# Patient Record
Sex: Female | Born: 1935 | Race: White | Hispanic: No | Marital: Married | Smoking: Former smoker
Health system: Southern US, Community
[De-identification: ages and names within clinical notes are randomized; demographics above are authoritative.]

## PROBLEM LIST (undated history)

## (undated) DIAGNOSIS — C801 Malignant (primary) neoplasm, unspecified: Secondary | ICD-10-CM

## (undated) DIAGNOSIS — R413 Other amnesia: Secondary | ICD-10-CM

## (undated) DIAGNOSIS — K635 Polyp of colon: Secondary | ICD-10-CM

## (undated) DIAGNOSIS — M069 Rheumatoid arthritis, unspecified: Secondary | ICD-10-CM

## (undated) DIAGNOSIS — G629 Polyneuropathy, unspecified: Secondary | ICD-10-CM

## (undated) DIAGNOSIS — M8580 Other specified disorders of bone density and structure, unspecified site: Secondary | ICD-10-CM

## (undated) DIAGNOSIS — I899 Noninfective disorder of lymphatic vessels and lymph nodes, unspecified: Secondary | ICD-10-CM

## (undated) DIAGNOSIS — Z973 Presence of spectacles and contact lenses: Secondary | ICD-10-CM

## (undated) DIAGNOSIS — E049 Nontoxic goiter, unspecified: Secondary | ICD-10-CM

## (undated) DIAGNOSIS — E785 Hyperlipidemia, unspecified: Secondary | ICD-10-CM

## (undated) HISTORY — PX: LUNG SURGERY: SHX703

## (undated) HISTORY — DX: Malignant (primary) neoplasm, unspecified: C80.1

## (undated) HISTORY — PX: CATARACT EXTRACTION: SUR2

## (undated) HISTORY — PX: FOOT SURGERY: SHX648

## (undated) HISTORY — PX: WRIST SURGERY: SHX841

## (undated) HISTORY — DX: Noninfective disorder of lymphatic vessels and lymph nodes, unspecified: I89.9

## (undated) HISTORY — DX: Polyp of colon: K63.5

## (undated) HISTORY — DX: Hyperlipidemia, unspecified: E78.5

## (undated) HISTORY — DX: Other specified disorders of bone density and structure, unspecified site: M85.80

## (undated) HISTORY — DX: Other amnesia: R41.3

## (undated) HISTORY — DX: Polyneuropathy, unspecified: G62.9

## (undated) HISTORY — DX: Nontoxic goiter, unspecified: E04.9

---

## 1998-01-16 ENCOUNTER — Ambulatory Visit (HOSPITAL_COMMUNITY): Admission: RE | Admit: 1998-01-16 | Discharge: 1998-01-16 | Payer: Self-pay | Admitting: Endocrinology

## 1999-03-20 ENCOUNTER — Encounter: Admission: RE | Admit: 1999-03-20 | Discharge: 1999-03-20 | Payer: Self-pay | Admitting: Orthopedic Surgery

## 1999-03-20 ENCOUNTER — Encounter: Payer: Self-pay | Admitting: Orthopedic Surgery

## 1999-08-03 ENCOUNTER — Emergency Department (HOSPITAL_COMMUNITY): Admission: EM | Admit: 1999-08-03 | Discharge: 1999-08-03 | Payer: Self-pay | Admitting: Emergency Medicine

## 1999-11-19 ENCOUNTER — Encounter: Payer: Self-pay | Admitting: Internal Medicine

## 1999-11-19 ENCOUNTER — Encounter: Admission: RE | Admit: 1999-11-19 | Discharge: 1999-11-19 | Payer: Self-pay | Admitting: Internal Medicine

## 2000-06-22 ENCOUNTER — Encounter: Admission: RE | Admit: 2000-06-22 | Discharge: 2000-06-22 | Payer: Self-pay | Admitting: Internal Medicine

## 2000-06-22 ENCOUNTER — Encounter: Payer: Self-pay | Admitting: Internal Medicine

## 2000-11-18 ENCOUNTER — Other Ambulatory Visit: Admission: RE | Admit: 2000-11-18 | Discharge: 2000-11-18 | Payer: Self-pay | Admitting: Internal Medicine

## 2000-11-24 ENCOUNTER — Encounter: Admission: RE | Admit: 2000-11-24 | Discharge: 2000-11-24 | Payer: Self-pay | Admitting: Internal Medicine

## 2000-11-24 ENCOUNTER — Encounter: Payer: Self-pay | Admitting: Internal Medicine

## 2000-11-25 ENCOUNTER — Encounter: Admission: RE | Admit: 2000-11-25 | Discharge: 2000-11-25 | Payer: Self-pay | Admitting: Internal Medicine

## 2000-11-25 ENCOUNTER — Encounter: Payer: Self-pay | Admitting: Internal Medicine

## 2001-01-03 ENCOUNTER — Encounter: Payer: Self-pay | Admitting: Orthopedic Surgery

## 2001-01-06 ENCOUNTER — Ambulatory Visit (HOSPITAL_COMMUNITY): Admission: RE | Admit: 2001-01-06 | Discharge: 2001-01-06 | Payer: Self-pay | Admitting: Orthopedic Surgery

## 2001-11-25 ENCOUNTER — Encounter: Admission: RE | Admit: 2001-11-25 | Discharge: 2001-11-25 | Payer: Self-pay | Admitting: Internal Medicine

## 2001-11-25 ENCOUNTER — Encounter: Payer: Self-pay | Admitting: Internal Medicine

## 2002-01-03 ENCOUNTER — Other Ambulatory Visit: Admission: RE | Admit: 2002-01-03 | Discharge: 2002-01-03 | Payer: Self-pay | Admitting: Internal Medicine

## 2002-08-03 ENCOUNTER — Encounter: Admission: RE | Admit: 2002-08-03 | Discharge: 2002-08-03 | Payer: Self-pay | Admitting: Rheumatology

## 2002-08-03 ENCOUNTER — Encounter: Payer: Self-pay | Admitting: Rheumatology

## 2002-11-29 ENCOUNTER — Encounter: Payer: Self-pay | Admitting: Internal Medicine

## 2002-11-29 ENCOUNTER — Encounter: Admission: RE | Admit: 2002-11-29 | Discharge: 2002-11-29 | Payer: Self-pay | Admitting: Internal Medicine

## 2003-03-16 ENCOUNTER — Encounter: Admission: RE | Admit: 2003-03-16 | Discharge: 2003-03-16 | Payer: Self-pay | Admitting: Internal Medicine

## 2003-10-04 ENCOUNTER — Ambulatory Visit (HOSPITAL_COMMUNITY): Admission: RE | Admit: 2003-10-04 | Discharge: 2003-10-04 | Payer: Self-pay | Admitting: Gastroenterology

## 2003-10-04 ENCOUNTER — Encounter (INDEPENDENT_AMBULATORY_CARE_PROVIDER_SITE_OTHER): Payer: Self-pay | Admitting: Specialist

## 2003-12-26 ENCOUNTER — Encounter: Admission: RE | Admit: 2003-12-26 | Discharge: 2003-12-26 | Payer: Self-pay | Admitting: Internal Medicine

## 2004-02-17 HISTORY — PX: LUNG SURGERY: SHX703

## 2004-03-25 ENCOUNTER — Other Ambulatory Visit: Admission: RE | Admit: 2004-03-25 | Discharge: 2004-03-25 | Payer: Self-pay | Admitting: Internal Medicine

## 2004-05-22 ENCOUNTER — Encounter: Admission: RE | Admit: 2004-05-22 | Discharge: 2004-05-22 | Payer: Self-pay | Admitting: Internal Medicine

## 2004-10-08 ENCOUNTER — Encounter: Admission: RE | Admit: 2004-10-08 | Discharge: 2004-10-08 | Payer: Self-pay | Admitting: Internal Medicine

## 2004-10-29 ENCOUNTER — Encounter: Admission: RE | Admit: 2004-10-29 | Discharge: 2004-10-29 | Payer: Self-pay | Admitting: Internal Medicine

## 2005-01-23 ENCOUNTER — Encounter: Admission: RE | Admit: 2005-01-23 | Discharge: 2005-01-23 | Payer: Self-pay | Admitting: Internal Medicine

## 2005-01-28 ENCOUNTER — Encounter: Admission: RE | Admit: 2005-01-28 | Discharge: 2005-01-28 | Payer: Self-pay | Admitting: Internal Medicine

## 2005-02-18 ENCOUNTER — Ambulatory Visit (HOSPITAL_COMMUNITY): Admission: RE | Admit: 2005-02-18 | Discharge: 2005-02-18 | Payer: Self-pay | Admitting: Thoracic Surgery

## 2005-03-09 ENCOUNTER — Inpatient Hospital Stay (HOSPITAL_COMMUNITY): Admission: RE | Admit: 2005-03-09 | Discharge: 2005-03-20 | Payer: Self-pay | Admitting: Thoracic Surgery

## 2005-03-09 ENCOUNTER — Encounter (INDEPENDENT_AMBULATORY_CARE_PROVIDER_SITE_OTHER): Payer: Self-pay | Admitting: *Deleted

## 2005-03-13 ENCOUNTER — Ambulatory Visit: Payer: Self-pay | Admitting: Internal Medicine

## 2005-03-25 ENCOUNTER — Encounter: Admission: RE | Admit: 2005-03-25 | Discharge: 2005-03-25 | Payer: Self-pay | Admitting: Thoracic Surgery

## 2005-04-01 ENCOUNTER — Encounter: Admission: RE | Admit: 2005-04-01 | Discharge: 2005-04-01 | Payer: Self-pay | Admitting: Thoracic Surgery

## 2005-04-29 ENCOUNTER — Encounter: Admission: RE | Admit: 2005-04-29 | Discharge: 2005-04-29 | Payer: Self-pay | Admitting: Thoracic Surgery

## 2005-06-16 ENCOUNTER — Encounter: Admission: RE | Admit: 2005-06-16 | Discharge: 2005-06-16 | Payer: Self-pay | Admitting: Thoracic Surgery

## 2005-06-16 ENCOUNTER — Ambulatory Visit: Payer: Self-pay | Admitting: Internal Medicine

## 2005-06-18 LAB — COMPREHENSIVE METABOLIC PANEL
ALT: 20 U/L (ref 0–40)
AST: 21 U/L (ref 0–37)
Albumin: 4 g/dL (ref 3.5–5.2)
Alkaline Phosphatase: 83 U/L (ref 39–117)
Potassium: 4.3 mEq/L (ref 3.5–5.3)
Sodium: 136 mEq/L (ref 135–145)
Total Protein: 6.6 g/dL (ref 6.0–8.3)

## 2005-06-18 LAB — CBC WITH DIFFERENTIAL/PLATELET
Eosinophils Absolute: 0.1 10*3/uL (ref 0.0–0.5)
HCT: 35.8 % (ref 34.8–46.6)
LYMPH%: 21.6 % (ref 14.0–48.0)
MONO#: 0.6 10*3/uL (ref 0.1–0.9)
NEUT#: 4.1 10*3/uL (ref 1.5–6.5)
NEUT%: 67.2 % (ref 39.6–76.8)
Platelets: 160 10*3/uL (ref 145–400)
WBC: 6.1 10*3/uL (ref 3.9–10.0)

## 2005-06-23 ENCOUNTER — Ambulatory Visit (HOSPITAL_COMMUNITY): Admission: RE | Admit: 2005-06-23 | Discharge: 2005-06-23 | Payer: Self-pay | Admitting: Internal Medicine

## 2005-09-08 ENCOUNTER — Encounter: Admission: RE | Admit: 2005-09-08 | Discharge: 2005-09-08 | Payer: Self-pay | Admitting: Internal Medicine

## 2005-11-17 ENCOUNTER — Encounter: Admission: RE | Admit: 2005-11-17 | Discharge: 2005-11-17 | Payer: Self-pay | Admitting: Thoracic Surgery

## 2005-12-15 ENCOUNTER — Ambulatory Visit: Payer: Self-pay | Admitting: Internal Medicine

## 2005-12-16 ENCOUNTER — Other Ambulatory Visit: Admission: RE | Admit: 2005-12-16 | Discharge: 2005-12-16 | Payer: Self-pay | Admitting: Family Medicine

## 2005-12-17 LAB — CBC WITH DIFFERENTIAL/PLATELET
Eosinophils Absolute: 0.1 10*3/uL (ref 0.0–0.5)
HCT: 35.9 % (ref 34.8–46.6)
LYMPH%: 34.7 % (ref 14.0–48.0)
MCHC: 34.1 g/dL (ref 32.0–36.0)
MONO#: 0.5 10*3/uL (ref 0.1–0.9)
NEUT%: 49 % (ref 39.6–76.8)
Platelets: 154 10*3/uL (ref 145–400)
WBC: 4 10*3/uL (ref 3.9–10.0)

## 2005-12-17 LAB — COMPREHENSIVE METABOLIC PANEL
BUN: 22 mg/dL (ref 6–23)
CO2: 28 mEq/L (ref 19–32)
Creatinine, Ser: 0.72 mg/dL (ref 0.40–1.20)
Glucose, Bld: 73 mg/dL (ref 70–99)
Total Bilirubin: 0.4 mg/dL (ref 0.3–1.2)

## 2005-12-21 ENCOUNTER — Ambulatory Visit (HOSPITAL_COMMUNITY): Admission: RE | Admit: 2005-12-21 | Discharge: 2005-12-21 | Payer: Self-pay | Admitting: Internal Medicine

## 2006-02-11 ENCOUNTER — Ambulatory Visit: Payer: Self-pay | Admitting: Internal Medicine

## 2006-02-19 ENCOUNTER — Ambulatory Visit (HOSPITAL_COMMUNITY): Admission: RE | Admit: 2006-02-19 | Discharge: 2006-02-19 | Payer: Self-pay | Admitting: Internal Medicine

## 2006-02-24 LAB — COMPREHENSIVE METABOLIC PANEL
AST: 23 U/L (ref 0–37)
Albumin: 4.3 g/dL (ref 3.5–5.2)
Alkaline Phosphatase: 89 U/L (ref 39–117)
BUN: 19 mg/dL (ref 6–23)
Creatinine, Ser: 0.71 mg/dL (ref 0.40–1.20)
Potassium: 4.1 mEq/L (ref 3.5–5.3)

## 2006-02-24 LAB — CBC WITH DIFFERENTIAL/PLATELET
Basophils Absolute: 0 10*3/uL (ref 0.0–0.1)
EOS%: 2.1 % (ref 0.0–7.0)
HGB: 12.9 g/dL (ref 11.6–15.9)
MCH: 30.1 pg (ref 26.0–34.0)
MCHC: 33.5 g/dL (ref 32.0–36.0)
MCV: 89.8 fL (ref 81.0–101.0)
MONO%: 8.5 % (ref 0.0–13.0)
RDW: 14.7 % — ABNORMAL HIGH (ref 11.3–14.5)

## 2006-03-18 ENCOUNTER — Emergency Department (HOSPITAL_COMMUNITY): Admission: EM | Admit: 2006-03-18 | Discharge: 2006-03-18 | Payer: Self-pay | Admitting: Emergency Medicine

## 2006-03-20 ENCOUNTER — Emergency Department (HOSPITAL_COMMUNITY): Admission: EM | Admit: 2006-03-20 | Discharge: 2006-03-20 | Payer: Self-pay | Admitting: Emergency Medicine

## 2006-03-21 ENCOUNTER — Inpatient Hospital Stay (HOSPITAL_COMMUNITY): Admission: AD | Admit: 2006-03-21 | Discharge: 2006-03-24 | Payer: Self-pay | Admitting: Endocrinology

## 2006-08-30 ENCOUNTER — Ambulatory Visit: Payer: Self-pay | Admitting: Internal Medicine

## 2006-09-01 LAB — CBC WITH DIFFERENTIAL/PLATELET
Basophils Absolute: 0 10*3/uL (ref 0.0–0.1)
EOS%: 2.3 % (ref 0.0–7.0)
HCT: 36.5 % (ref 34.8–46.6)
HGB: 12.9 g/dL (ref 11.6–15.9)
LYMPH%: 30.4 % (ref 14.0–48.0)
MCH: 30.6 pg (ref 26.0–34.0)
MCV: 87.1 fL (ref 81.0–101.0)
MONO%: 9.9 % (ref 0.0–13.0)
NEUT%: 56.9 % (ref 39.6–76.8)

## 2006-09-01 LAB — COMPREHENSIVE METABOLIC PANEL
AST: 30 U/L (ref 0–37)
Alkaline Phosphatase: 84 U/L (ref 39–117)
BUN: 18 mg/dL (ref 6–23)
Creatinine, Ser: 0.87 mg/dL (ref 0.40–1.20)

## 2006-09-03 ENCOUNTER — Ambulatory Visit (HOSPITAL_COMMUNITY): Admission: RE | Admit: 2006-09-03 | Discharge: 2006-09-03 | Payer: Self-pay | Admitting: Internal Medicine

## 2006-09-23 ENCOUNTER — Encounter: Admission: RE | Admit: 2006-09-23 | Discharge: 2006-09-23 | Payer: Self-pay | Admitting: Family Medicine

## 2006-10-27 ENCOUNTER — Ambulatory Visit (HOSPITAL_COMMUNITY): Admission: RE | Admit: 2006-10-27 | Discharge: 2006-10-27 | Payer: Self-pay | Admitting: Internal Medicine

## 2006-11-01 ENCOUNTER — Ambulatory Visit: Payer: Self-pay | Admitting: Internal Medicine

## 2006-11-04 ENCOUNTER — Ambulatory Visit: Payer: Self-pay | Admitting: Thoracic Surgery

## 2007-02-14 ENCOUNTER — Ambulatory Visit: Payer: Self-pay | Admitting: Internal Medicine

## 2007-02-16 ENCOUNTER — Ambulatory Visit (HOSPITAL_COMMUNITY): Admission: RE | Admit: 2007-02-16 | Discharge: 2007-02-16 | Payer: Self-pay | Admitting: Internal Medicine

## 2007-02-16 LAB — CBC WITH DIFFERENTIAL/PLATELET
Basophils Absolute: 0.1 10*3/uL (ref 0.0–0.1)
EOS%: 1.5 % (ref 0.0–7.0)
HCT: 38.6 % (ref 34.8–46.6)
HGB: 13.2 g/dL (ref 11.6–15.9)
LYMPH%: 46.2 % (ref 14.0–48.0)
MCH: 29.3 pg (ref 26.0–34.0)
MCHC: 34.3 g/dL (ref 32.0–36.0)
MCV: 85.6 fL (ref 81.0–101.0)
MONO%: 6.6 % (ref 0.0–13.0)
NEUT%: 44.4 % (ref 39.6–76.8)
Platelets: 121 10*3/uL — ABNORMAL LOW (ref 145–400)
lymph#: 2.1 10*3/uL (ref 0.9–3.3)

## 2007-02-16 LAB — COMPREHENSIVE METABOLIC PANEL
AST: 30 U/L (ref 0–37)
BUN: 14 mg/dL (ref 6–23)
Calcium: 9.4 mg/dL (ref 8.4–10.5)
Chloride: 103 mEq/L (ref 96–112)
Creatinine, Ser: 0.85 mg/dL (ref 0.40–1.20)
Total Bilirubin: 0.4 mg/dL (ref 0.3–1.2)

## 2007-02-23 ENCOUNTER — Ambulatory Visit: Payer: Self-pay | Admitting: Thoracic Surgery

## 2007-05-16 ENCOUNTER — Ambulatory Visit: Payer: Self-pay | Admitting: Internal Medicine

## 2007-05-18 ENCOUNTER — Ambulatory Visit (HOSPITAL_COMMUNITY): Admission: RE | Admit: 2007-05-18 | Discharge: 2007-05-18 | Payer: Self-pay | Admitting: Internal Medicine

## 2007-05-18 LAB — COMPREHENSIVE METABOLIC PANEL
ALT: 22 U/L (ref 0–35)
CO2: 28 mEq/L (ref 19–32)
Creatinine, Ser: 0.7 mg/dL (ref 0.40–1.20)
Total Bilirubin: 0.5 mg/dL (ref 0.3–1.2)

## 2007-05-18 LAB — CBC WITH DIFFERENTIAL/PLATELET
BASO%: 0.1 % (ref 0.0–2.0)
EOS%: 0.8 % (ref 0.0–7.0)
HCT: 36.9 % (ref 34.8–46.6)
LYMPH%: 27.3 % (ref 14.0–48.0)
MCH: 29.6 pg (ref 26.0–34.0)
MCHC: 34 g/dL (ref 32.0–36.0)
MCV: 86.9 fL (ref 81.0–101.0)
MONO#: 0.6 10*3/uL (ref 0.1–0.9)
NEUT%: 63.6 % (ref 39.6–76.8)
Platelets: 195 10*3/uL (ref 145–400)

## 2007-05-26 ENCOUNTER — Ambulatory Visit: Payer: Self-pay | Admitting: Thoracic Surgery

## 2007-05-27 ENCOUNTER — Ambulatory Visit (HOSPITAL_COMMUNITY): Admission: RE | Admit: 2007-05-27 | Discharge: 2007-05-27 | Payer: Self-pay | Admitting: Internal Medicine

## 2007-06-01 ENCOUNTER — Ambulatory Visit: Admission: RE | Admit: 2007-06-01 | Discharge: 2007-06-01 | Payer: Self-pay | Admitting: Thoracic Surgery

## 2007-06-13 ENCOUNTER — Inpatient Hospital Stay (HOSPITAL_COMMUNITY): Admission: RE | Admit: 2007-06-13 | Discharge: 2007-06-20 | Payer: Self-pay | Admitting: Thoracic Surgery

## 2007-06-13 ENCOUNTER — Encounter: Payer: Self-pay | Admitting: Thoracic Surgery

## 2007-06-13 ENCOUNTER — Ambulatory Visit: Payer: Self-pay | Admitting: Thoracic Surgery

## 2007-06-28 ENCOUNTER — Encounter: Admission: RE | Admit: 2007-06-28 | Discharge: 2007-06-28 | Payer: Self-pay | Admitting: Thoracic Surgery

## 2007-06-28 ENCOUNTER — Ambulatory Visit: Payer: Self-pay | Admitting: Thoracic Surgery

## 2007-07-04 ENCOUNTER — Ambulatory Visit: Payer: Self-pay | Admitting: Internal Medicine

## 2007-07-06 LAB — URINALYSIS, MICROSCOPIC - CHCC
Ketones: NEGATIVE mg/dL
Nitrite: POSITIVE
Protein: NEGATIVE mg/dL
Specific Gravity, Urine: 1.01 (ref 1.003–1.035)
pH: 7 (ref 4.6–8.0)

## 2007-07-20 ENCOUNTER — Ambulatory Visit: Payer: Self-pay | Admitting: Thoracic Surgery

## 2007-07-20 ENCOUNTER — Encounter: Admission: RE | Admit: 2007-07-20 | Discharge: 2007-07-20 | Payer: Self-pay | Admitting: Thoracic Surgery

## 2007-07-25 ENCOUNTER — Ambulatory Visit: Payer: Self-pay | Admitting: Surgery

## 2007-07-27 LAB — COMPREHENSIVE METABOLIC PANEL
ALT: 17 U/L (ref 0–35)
CO2: 25 mEq/L (ref 19–32)
Calcium: 9.4 mg/dL (ref 8.4–10.5)
Chloride: 100 mEq/L (ref 96–112)
Sodium: 136 mEq/L (ref 135–145)
Total Bilirubin: 0.4 mg/dL (ref 0.3–1.2)
Total Protein: 6.7 g/dL (ref 6.0–8.3)

## 2007-07-27 LAB — CBC WITH DIFFERENTIAL/PLATELET
BASO%: 0.9 % (ref 0.0–2.0)
MCHC: 33.6 g/dL (ref 32.0–36.0)
MONO#: 0.6 10*3/uL (ref 0.1–0.9)
RBC: 4.4 10*6/uL (ref 3.70–5.32)
WBC: 6.9 10*3/uL (ref 3.9–10.0)
lymph#: 1.5 10*3/uL (ref 0.9–3.3)

## 2007-08-03 LAB — COMPREHENSIVE METABOLIC PANEL
ALT: 14 U/L (ref 0–35)
AST: 15 U/L (ref 0–37)
Alkaline Phosphatase: 88 U/L (ref 39–117)
CO2: 27 mEq/L (ref 19–32)
Sodium: 136 mEq/L (ref 135–145)
Total Bilirubin: 0.3 mg/dL (ref 0.3–1.2)
Total Protein: 6 g/dL (ref 6.0–8.3)

## 2007-08-03 LAB — CBC WITH DIFFERENTIAL/PLATELET
BASO%: 0.3 % (ref 0.0–2.0)
EOS%: 1.4 % (ref 0.0–7.0)
LYMPH%: 73.9 % — ABNORMAL HIGH (ref 14.0–48.0)
MCH: 28.6 pg (ref 26.0–34.0)
MCHC: 33.7 g/dL (ref 32.0–36.0)
MONO#: 0 10*3/uL — ABNORMAL LOW (ref 0.1–0.9)
Platelets: 108 10*3/uL — ABNORMAL LOW (ref 145–400)
RBC: 4.19 10*6/uL (ref 3.70–5.32)
WBC: 1.5 10*3/uL — ABNORMAL LOW (ref 3.9–10.0)

## 2007-08-10 LAB — COMPREHENSIVE METABOLIC PANEL
ALT: 20 U/L (ref 0–35)
AST: 18 U/L (ref 0–37)
Albumin: 3.7 g/dL (ref 3.5–5.2)
Calcium: 8.5 mg/dL (ref 8.4–10.5)
Chloride: 99 mEq/L (ref 96–112)
Potassium: 3.8 mEq/L (ref 3.5–5.3)
Sodium: 136 mEq/L (ref 135–145)

## 2007-08-10 LAB — CBC WITH DIFFERENTIAL/PLATELET
BASO%: 0.4 % (ref 0.0–2.0)
EOS%: 0.1 % (ref 0.0–7.0)
HGB: 11.8 g/dL (ref 11.6–15.9)
MCH: 28.9 pg (ref 26.0–34.0)
MCHC: 33.8 g/dL (ref 32.0–36.0)
RBC: 4.08 10*6/uL (ref 3.70–5.32)
RDW: 15.1 % — ABNORMAL HIGH (ref 11.3–14.5)
lymph#: 2.1 10*3/uL (ref 0.9–3.3)

## 2007-08-12 ENCOUNTER — Ambulatory Visit: Payer: Self-pay | Admitting: Internal Medicine

## 2007-08-16 LAB — CBC WITH DIFFERENTIAL/PLATELET
BASO%: 1.2 % (ref 0.0–2.0)
EOS%: 0.1 % (ref 0.0–7.0)
HCT: 37 % (ref 34.8–46.6)
MCH: 28.7 pg (ref 26.0–34.0)
MCHC: 33.2 g/dL (ref 32.0–36.0)
MCV: 86.5 fL (ref 81.0–101.0)
MONO%: 7.5 % (ref 0.0–13.0)
NEUT%: 73.8 % (ref 39.6–76.8)
RDW: 15 % — ABNORMAL HIGH (ref 11.3–14.5)
lymph#: 1.7 10*3/uL (ref 0.9–3.3)

## 2007-08-16 LAB — COMPREHENSIVE METABOLIC PANEL
ALT: 21 U/L (ref 0–35)
AST: 15 U/L (ref 0–37)
Alkaline Phosphatase: 107 U/L (ref 39–117)
Calcium: 9.9 mg/dL (ref 8.4–10.5)
Chloride: 99 mEq/L (ref 96–112)
Creatinine, Ser: 0.6 mg/dL (ref 0.40–1.20)

## 2007-08-24 LAB — CBC WITH DIFFERENTIAL/PLATELET
BASO%: 1.2 % (ref 0.0–2.0)
Basophils Absolute: 0 10*3/uL (ref 0.0–0.1)
Eosinophils Absolute: 0 10*3/uL (ref 0.0–0.5)
HCT: 31.2 % — ABNORMAL LOW (ref 34.8–46.6)
HGB: 10.9 g/dL — ABNORMAL LOW (ref 11.6–15.9)
LYMPH%: 36.8 % (ref 14.0–48.0)
MCHC: 34.8 g/dL (ref 32.0–36.0)
MONO#: 0.3 10*3/uL (ref 0.1–0.9)
NEUT%: 50.3 % (ref 39.6–76.8)
Platelets: 222 10*3/uL (ref 145–400)
WBC: 2.9 10*3/uL — ABNORMAL LOW (ref 3.9–10.0)

## 2007-08-24 LAB — COMPREHENSIVE METABOLIC PANEL
AST: 14 U/L (ref 0–37)
Alkaline Phosphatase: 90 U/L (ref 39–117)
Glucose, Bld: 114 mg/dL — ABNORMAL HIGH (ref 70–99)
Sodium: 141 mEq/L (ref 135–145)
Total Bilirubin: 0.3 mg/dL (ref 0.3–1.2)
Total Protein: 5.8 g/dL — ABNORMAL LOW (ref 6.0–8.3)

## 2007-08-31 ENCOUNTER — Ambulatory Visit: Payer: Self-pay | Admitting: Thoracic Surgery

## 2007-08-31 ENCOUNTER — Encounter: Admission: RE | Admit: 2007-08-31 | Discharge: 2007-08-31 | Payer: Self-pay | Admitting: Thoracic Surgery

## 2007-08-31 LAB — COMPREHENSIVE METABOLIC PANEL
Alkaline Phosphatase: 136 U/L — ABNORMAL HIGH (ref 39–117)
BUN: 13 mg/dL (ref 6–23)
Creatinine, Ser: 0.79 mg/dL (ref 0.40–1.20)
Glucose, Bld: 381 mg/dL — ABNORMAL HIGH (ref 70–99)
Total Bilirubin: 0.3 mg/dL (ref 0.3–1.2)

## 2007-08-31 LAB — CBC WITH DIFFERENTIAL/PLATELET
Basophils Absolute: 0 10*3/uL (ref 0.0–0.1)
Eosinophils Absolute: 0.1 10*3/uL (ref 0.0–0.5)
HGB: 11.8 g/dL (ref 11.6–15.9)
LYMPH%: 15.8 % (ref 14.0–48.0)
MCH: 28.6 pg (ref 26.0–34.0)
MCV: 84.9 fL (ref 81.0–101.0)
MONO%: 2.9 % (ref 0.0–13.0)
NEUT#: 12.5 10*3/uL — ABNORMAL HIGH (ref 1.5–6.5)
Platelets: 53 10*3/uL — ABNORMAL LOW (ref 145–400)

## 2007-09-07 LAB — CBC WITH DIFFERENTIAL/PLATELET
Basophils Absolute: 0.2 10*3/uL — ABNORMAL HIGH (ref 0.0–0.1)
Eosinophils Absolute: 0 10*3/uL (ref 0.0–0.5)
HCT: 34.1 % — ABNORMAL LOW (ref 34.8–46.6)
HGB: 11.6 g/dL (ref 11.6–15.9)
LYMPH%: 13.3 % — ABNORMAL LOW (ref 14.0–48.0)
MCV: 84.5 fL (ref 81.0–101.0)
MONO%: 6.6 % (ref 0.0–13.0)
NEUT#: 10.7 10*3/uL — ABNORMAL HIGH (ref 1.5–6.5)
Platelets: 316 10*3/uL (ref 145–400)
RDW: 16.9 % — ABNORMAL HIGH (ref 11.3–14.5)

## 2007-09-07 LAB — COMPREHENSIVE METABOLIC PANEL
Albumin: 4.2 g/dL (ref 3.5–5.2)
Alkaline Phosphatase: 94 U/L (ref 39–117)
BUN: 16 mg/dL (ref 6–23)
Glucose, Bld: 94 mg/dL (ref 70–99)
Potassium: 3.9 mEq/L (ref 3.5–5.3)

## 2007-09-14 LAB — CBC WITH DIFFERENTIAL/PLATELET
BASO%: 0.4 % (ref 0.0–2.0)
HCT: 30.4 % — ABNORMAL LOW (ref 34.8–46.6)
MCHC: 33.9 g/dL (ref 32.0–36.0)
MONO#: 0.1 10*3/uL (ref 0.1–0.9)
NEUT#: 2.5 10*3/uL (ref 1.5–6.5)
NEUT%: 71.3 % (ref 39.6–76.8)
WBC: 3.6 10*3/uL — ABNORMAL LOW (ref 3.9–10.0)
lymph#: 0.9 10*3/uL (ref 0.9–3.3)

## 2007-09-14 LAB — COMPREHENSIVE METABOLIC PANEL
ALT: 12 U/L (ref 0–35)
CO2: 28 mEq/L (ref 19–32)
Calcium: 9.2 mg/dL (ref 8.4–10.5)
Chloride: 101 mEq/L (ref 96–112)
Creatinine, Ser: 0.66 mg/dL (ref 0.40–1.20)
Sodium: 138 mEq/L (ref 135–145)
Total Protein: 5.7 g/dL — ABNORMAL LOW (ref 6.0–8.3)

## 2007-09-21 LAB — CBC WITH DIFFERENTIAL/PLATELET
BASO%: 2.2 % — ABNORMAL HIGH (ref 0.0–2.0)
Eosinophils Absolute: 0 10*3/uL (ref 0.0–0.5)
MCV: 87.4 fL (ref 81.0–101.0)
MONO%: 1.9 % (ref 0.0–13.0)
NEUT#: 13 10*3/uL — ABNORMAL HIGH (ref 1.5–6.5)
RBC: 3.9 10*6/uL (ref 3.70–5.32)
RDW: 20.4 % — ABNORMAL HIGH (ref 11.3–14.5)
WBC: 14.8 10*3/uL — ABNORMAL HIGH (ref 3.9–10.0)

## 2007-09-21 LAB — URINALYSIS, MICROSCOPIC - CHCC
Bilirubin (Urine): NEGATIVE
Specific Gravity, Urine: 1.005 (ref 1.003–1.035)

## 2007-09-21 LAB — COMPREHENSIVE METABOLIC PANEL
ALT: 18 U/L (ref 0–35)
AST: 15 U/L (ref 0–37)
Calcium: 9.6 mg/dL (ref 8.4–10.5)
Chloride: 98 mEq/L (ref 96–112)
Creatinine, Ser: 0.69 mg/dL (ref 0.40–1.20)
Potassium: 4.6 mEq/L (ref 3.5–5.3)
Sodium: 136 mEq/L (ref 135–145)

## 2007-09-27 ENCOUNTER — Ambulatory Visit: Payer: Self-pay | Admitting: Internal Medicine

## 2007-10-05 LAB — CBC WITH DIFFERENTIAL/PLATELET
BASO%: 0.3 % (ref 0.0–2.0)
Basophils Absolute: 0 10e3/uL (ref 0.0–0.1)
EOS%: 0.3 % (ref 0.0–7.0)
Eosinophils Absolute: 0 10e3/uL (ref 0.0–0.5)
HCT: 30.2 % — ABNORMAL LOW (ref 34.8–46.6)
HGB: 10.3 g/dL — ABNORMAL LOW (ref 11.6–15.9)
LYMPH%: 22.5 % (ref 14.0–48.0)
MCH: 29.9 pg (ref 26.0–34.0)
MCHC: 34.1 g/dL (ref 32.0–36.0)
MCV: 87.7 fL (ref 81.0–101.0)
MONO#: 0.1 10e3/uL (ref 0.1–0.9)
MONO%: 2.7 % (ref 0.0–13.0)
NEUT#: 3.1 10e3/uL (ref 1.5–6.5)
NEUT%: 74.2 % (ref 39.6–76.8)
Platelets: 184 10e3/uL (ref 145–400)
RBC: 3.44 10e6/uL — ABNORMAL LOW (ref 3.70–5.32)
RDW: 20.5 % — ABNORMAL HIGH (ref 11.3–14.5)
WBC: 4.2 10e3/uL (ref 3.9–10.0)
lymph#: 0.9 10e3/uL (ref 0.9–3.3)

## 2007-10-05 LAB — COMPREHENSIVE METABOLIC PANEL WITH GFR
ALT: 12 U/L (ref 0–35)
AST: 15 U/L (ref 0–37)
Albumin: 3.7 g/dL (ref 3.5–5.2)
Alkaline Phosphatase: 94 U/L (ref 39–117)
BUN: 21 mg/dL (ref 6–23)
CO2: 28 meq/L (ref 19–32)
Calcium: 9.1 mg/dL (ref 8.4–10.5)
Chloride: 103 meq/L (ref 96–112)
Creatinine, Ser: 0.66 mg/dL (ref 0.40–1.20)
Glucose, Bld: 326 mg/dL — ABNORMAL HIGH (ref 70–99)
Potassium: 4.5 meq/L (ref 3.5–5.3)
Sodium: 140 meq/L (ref 135–145)
Total Bilirubin: 0.3 mg/dL (ref 0.3–1.2)
Total Protein: 5.7 g/dL — ABNORMAL LOW (ref 6.0–8.3)

## 2007-10-13 LAB — COMPREHENSIVE METABOLIC PANEL
ALT: 20 U/L (ref 0–35)
AST: 17 U/L (ref 0–37)
BUN: 14 mg/dL (ref 6–23)
CO2: 24 mEq/L (ref 19–32)
Creatinine, Ser: 0.83 mg/dL (ref 0.40–1.20)
Total Bilirubin: 0.3 mg/dL (ref 0.3–1.2)

## 2007-10-13 LAB — CBC WITH DIFFERENTIAL/PLATELET
BASO%: 0.3 % (ref 0.0–2.0)
EOS%: 0.4 % (ref 0.0–7.0)
HCT: 32.8 % — ABNORMAL LOW (ref 34.8–46.6)
LYMPH%: 14.2 % (ref 14.0–48.0)
MCH: 30.5 pg (ref 26.0–34.0)
MCHC: 34.2 g/dL (ref 32.0–36.0)
NEUT%: 81.7 % — ABNORMAL HIGH (ref 39.6–76.8)
Platelets: 60 10*3/uL — ABNORMAL LOW (ref 145–400)

## 2007-10-19 ENCOUNTER — Ambulatory Visit (HOSPITAL_COMMUNITY): Admission: RE | Admit: 2007-10-19 | Discharge: 2007-10-19 | Payer: Self-pay | Admitting: Internal Medicine

## 2007-10-27 LAB — COMPREHENSIVE METABOLIC PANEL
CO2: 28 mEq/L (ref 19–32)
Creatinine, Ser: 0.82 mg/dL (ref 0.40–1.20)
Glucose, Bld: 488 mg/dL — ABNORMAL HIGH (ref 70–99)
Total Bilirubin: 0.4 mg/dL (ref 0.3–1.2)

## 2007-10-27 LAB — CBC WITH DIFFERENTIAL/PLATELET
Eosinophils Absolute: 0 10*3/uL (ref 0.0–0.5)
HCT: 34.7 % — ABNORMAL LOW (ref 34.8–46.6)
LYMPH%: 25.3 % (ref 14.0–48.0)
MCHC: 33.7 g/dL (ref 32.0–36.0)
MONO#: 0.6 10*3/uL (ref 0.1–0.9)
NEUT#: 3.8 10*3/uL (ref 1.5–6.5)
NEUT%: 63 % (ref 39.6–76.8)
Platelets: 305 10*3/uL (ref 145–400)
WBC: 6 10*3/uL (ref 3.9–10.0)

## 2007-11-30 ENCOUNTER — Encounter: Admission: RE | Admit: 2007-11-30 | Discharge: 2007-11-30 | Payer: Self-pay | Admitting: Thoracic Surgery

## 2007-11-30 ENCOUNTER — Ambulatory Visit: Payer: Self-pay | Admitting: Thoracic Surgery

## 2007-12-15 ENCOUNTER — Encounter: Admission: RE | Admit: 2007-12-15 | Discharge: 2007-12-15 | Payer: Self-pay | Admitting: Endocrinology

## 2008-01-19 ENCOUNTER — Ambulatory Visit: Payer: Self-pay | Admitting: Internal Medicine

## 2008-01-24 ENCOUNTER — Ambulatory Visit (HOSPITAL_COMMUNITY): Admission: RE | Admit: 2008-01-24 | Discharge: 2008-01-24 | Payer: Self-pay | Admitting: Internal Medicine

## 2008-03-15 ENCOUNTER — Ambulatory Visit: Payer: Self-pay | Admitting: Thoracic Surgery

## 2008-05-01 ENCOUNTER — Ambulatory Visit: Payer: Self-pay | Admitting: Internal Medicine

## 2008-05-03 ENCOUNTER — Ambulatory Visit (HOSPITAL_COMMUNITY): Admission: RE | Admit: 2008-05-03 | Discharge: 2008-05-03 | Payer: Self-pay | Admitting: Internal Medicine

## 2008-05-03 LAB — CBC WITH DIFFERENTIAL/PLATELET
BASO%: 0.3 % (ref 0.0–2.0)
Basophils Absolute: 0 10*3/uL (ref 0.0–0.1)
EOS%: 1.5 % (ref 0.0–7.0)
Eosinophils Absolute: 0.1 10*3/uL (ref 0.0–0.5)
HCT: 40.2 % (ref 34.8–46.6)
HGB: 13.6 g/dL (ref 11.6–15.9)
LYMPH%: 30.6 % (ref 14.0–49.7)
MCH: 30 pg (ref 25.1–34.0)
MCHC: 33.9 g/dL (ref 31.5–36.0)
MCV: 88.6 fL (ref 79.5–101.0)
MONO#: 0.6 10*3/uL (ref 0.1–0.9)
MONO%: 9.7 % (ref 0.0–14.0)
NEUT#: 3.5 10*3/uL (ref 1.5–6.5)
NEUT%: 57.9 % (ref 38.4–76.8)
Platelets: 156 10*3/uL (ref 145–400)
RBC: 4.54 10*6/uL (ref 3.70–5.45)
RDW: 14.8 % — ABNORMAL HIGH (ref 11.2–14.5)
WBC: 6.1 10*3/uL (ref 3.9–10.3)
lymph#: 1.9 10*3/uL (ref 0.9–3.3)

## 2008-05-03 LAB — COMPREHENSIVE METABOLIC PANEL
ALT: 32 U/L (ref 0–35)
AST: 21 U/L (ref 0–37)
Albumin: 3.6 g/dL (ref 3.5–5.2)
Alkaline Phosphatase: 79 U/L (ref 39–117)
BUN: 16 mg/dL (ref 6–23)
CO2: 32 mEq/L (ref 19–32)
Calcium: 10.2 mg/dL (ref 8.4–10.5)
Chloride: 101 mEq/L (ref 96–112)
Creatinine, Ser: 0.73 mg/dL (ref 0.40–1.20)
Glucose, Bld: 102 mg/dL — ABNORMAL HIGH (ref 70–99)
Potassium: 3.9 mEq/L (ref 3.5–5.3)
Sodium: 140 mEq/L (ref 135–145)
Total Bilirubin: 0.4 mg/dL (ref 0.3–1.2)
Total Protein: 6.9 g/dL (ref 6.0–8.3)

## 2008-05-30 ENCOUNTER — Ambulatory Visit: Payer: Self-pay | Admitting: Thoracic Surgery

## 2008-08-28 ENCOUNTER — Ambulatory Visit: Payer: Self-pay | Admitting: Internal Medicine

## 2008-08-30 ENCOUNTER — Ambulatory Visit (HOSPITAL_COMMUNITY): Admission: RE | Admit: 2008-08-30 | Discharge: 2008-08-30 | Payer: Self-pay | Admitting: Internal Medicine

## 2008-08-30 LAB — CBC WITH DIFFERENTIAL/PLATELET
Basophils Absolute: 0 10*3/uL (ref 0.0–0.1)
Eosinophils Absolute: 0 10*3/uL (ref 0.0–0.5)
HCT: 38.8 % (ref 34.8–46.6)
HGB: 13.5 g/dL (ref 11.6–15.9)
LYMPH%: 25.2 % (ref 14.0–49.7)
MCV: 88.7 fL (ref 79.5–101.0)
MONO#: 0.4 10*3/uL (ref 0.1–0.9)
NEUT#: 3.5 10*3/uL (ref 1.5–6.5)
NEUT%: 65.1 % (ref 38.4–76.8)
Platelets: 154 10*3/uL (ref 145–400)
RBC: 4.37 10*6/uL (ref 3.70–5.45)
WBC: 5.3 10*3/uL (ref 3.9–10.3)

## 2008-08-30 LAB — COMPREHENSIVE METABOLIC PANEL
BUN: 15 mg/dL (ref 6–23)
CO2: 30 mEq/L (ref 19–32)
Glucose, Bld: 247 mg/dL — ABNORMAL HIGH (ref 70–99)
Sodium: 138 mEq/L (ref 135–145)
Total Bilirubin: 0.7 mg/dL (ref 0.3–1.2)
Total Protein: 6.5 g/dL (ref 6.0–8.3)

## 2008-10-10 ENCOUNTER — Ambulatory Visit: Payer: Self-pay | Admitting: Thoracic Surgery

## 2008-11-28 ENCOUNTER — Encounter: Admission: RE | Admit: 2008-11-28 | Discharge: 2008-11-28 | Payer: Self-pay | Admitting: Family Medicine

## 2009-01-01 ENCOUNTER — Ambulatory Visit: Payer: Self-pay | Admitting: Internal Medicine

## 2009-01-03 ENCOUNTER — Ambulatory Visit (HOSPITAL_COMMUNITY): Admission: RE | Admit: 2009-01-03 | Discharge: 2009-01-03 | Payer: Self-pay | Admitting: Internal Medicine

## 2009-01-03 LAB — COMPREHENSIVE METABOLIC PANEL
Alkaline Phosphatase: 73 U/L (ref 39–117)
BUN: 18 mg/dL (ref 6–23)
CO2: 32 mEq/L (ref 19–32)
Creatinine, Ser: 0.86 mg/dL (ref 0.40–1.20)
Glucose, Bld: 67 mg/dL — ABNORMAL LOW (ref 70–99)
Total Bilirubin: 0.5 mg/dL (ref 0.3–1.2)

## 2009-01-03 LAB — CBC WITH DIFFERENTIAL/PLATELET
Eosinophils Absolute: 0.1 10*3/uL (ref 0.0–0.5)
HCT: 38.4 % (ref 34.8–46.6)
LYMPH%: 11.1 % — ABNORMAL LOW (ref 14.0–49.7)
MCV: 92.3 fL (ref 79.5–101.0)
MONO#: 0.6 10*3/uL (ref 0.1–0.9)
MONO%: 7.5 % (ref 0.0–14.0)
NEUT#: 6.3 10*3/uL (ref 1.5–6.5)
NEUT%: 80.4 % — ABNORMAL HIGH (ref 38.4–76.8)
Platelets: 151 10*3/uL (ref 145–400)
RBC: 4.16 10*6/uL (ref 3.70–5.45)
WBC: 7.8 10*3/uL (ref 3.9–10.3)

## 2009-01-23 ENCOUNTER — Encounter: Admission: RE | Admit: 2009-01-23 | Discharge: 2009-01-23 | Payer: Self-pay | Admitting: Family Medicine

## 2009-01-23 ENCOUNTER — Ambulatory Visit: Payer: Self-pay | Admitting: Thoracic Surgery

## 2009-04-26 IMAGING — CT CT CHEST W/ CM
2 of 3 series · 15 of 36 positions shown, 18 images · IV contrast (agent unspecified)
Comparison: 10/19/2007

CLINICAL DATA: Lung cancer

CT CHEST WITH CONTRAST
TECHNIQUE: Multidetector CT imaging of the chest was performed
following the standard protocol during bolus administration of
intravenous contrast.
Contrast: 80 ml Rmnipaque-FCC

[Series 2: chest routine 5.0 b40f · axial · 0.67mm/px · z∈[-369,-89]mm · 12 of 66 slices shown, 15 images]
[im 5/66  mediastinal]
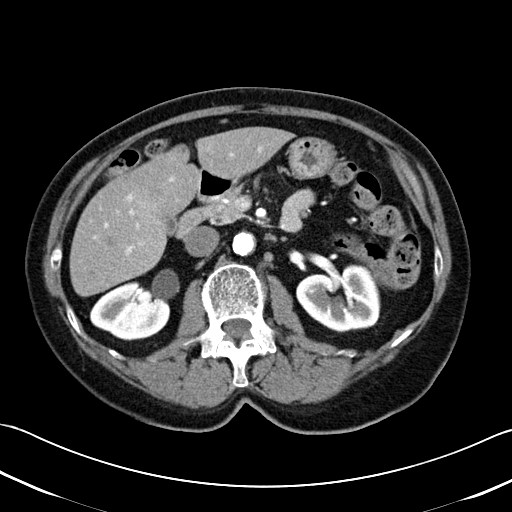
[im 5/66  lung]
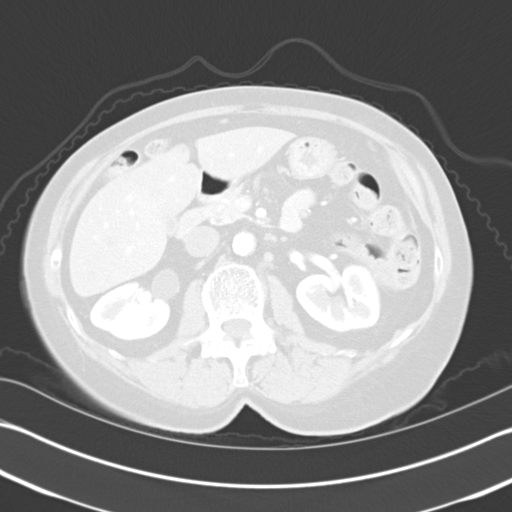
[im 10/66  lung]
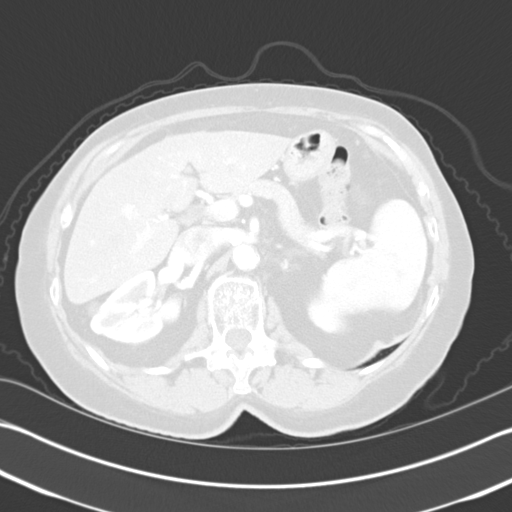
[im 15/66  lung]
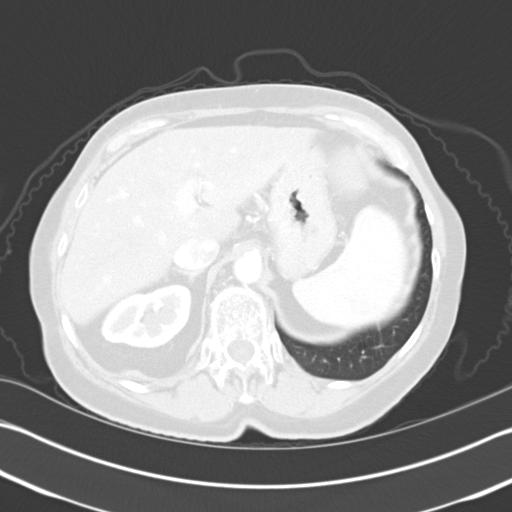
[im 20/66  lung]
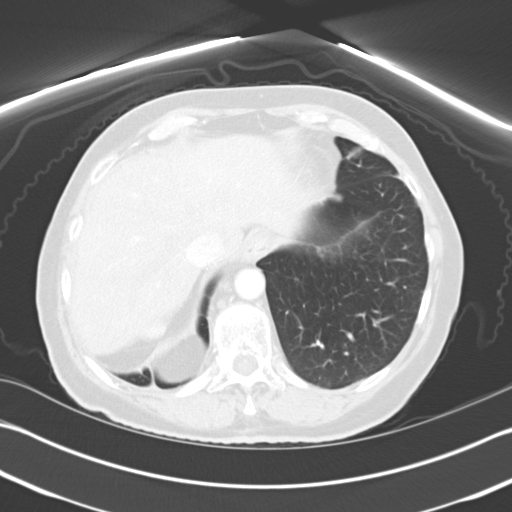
[im 25/66  mediastinal]
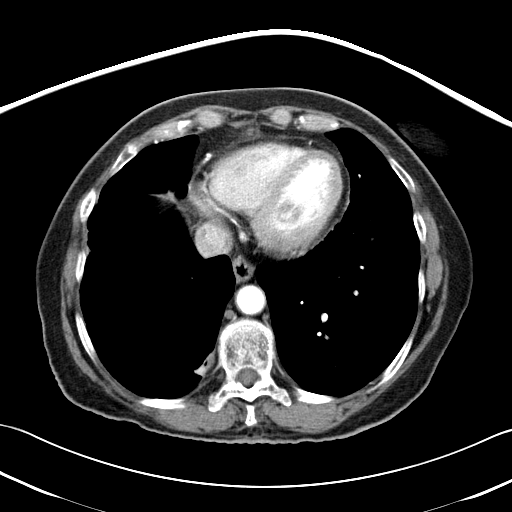
[im 25/66  lung]
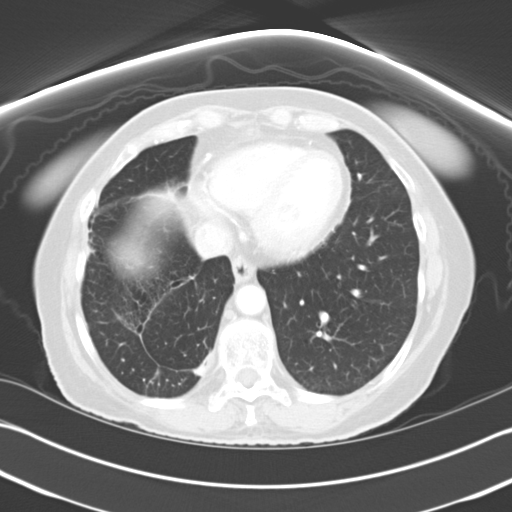
[im 29/66  lung]
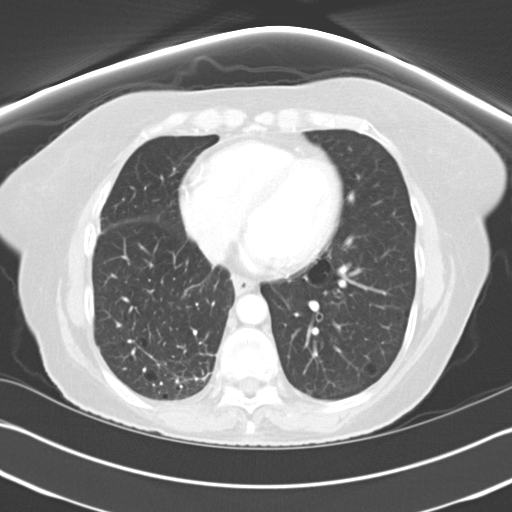
[im 37/66  lung]
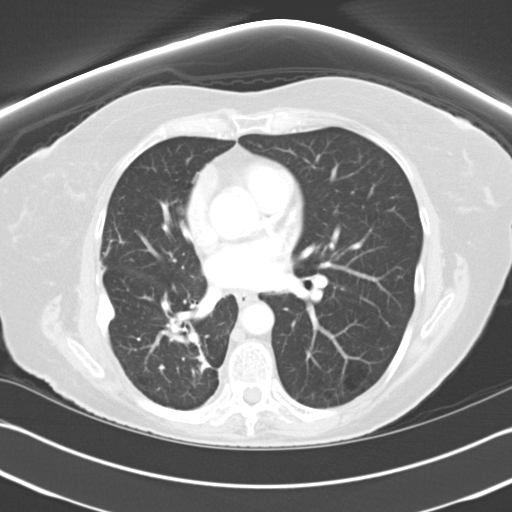
[im 41/66  lung]
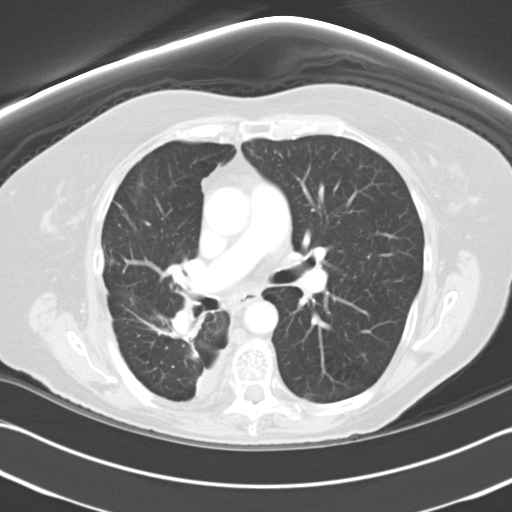
[im 46/66  mediastinal]
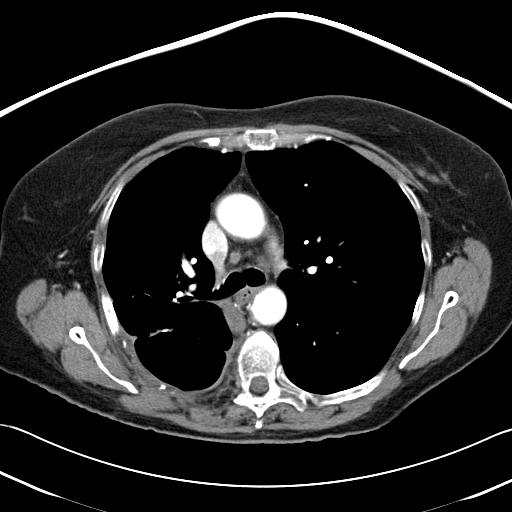
[im 46/66  lung]
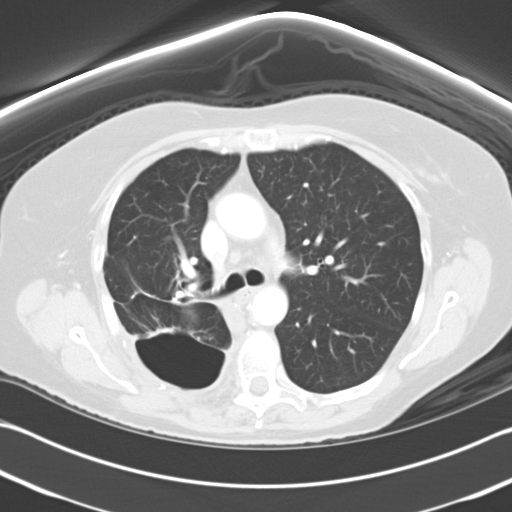
[im 51/66  lung]
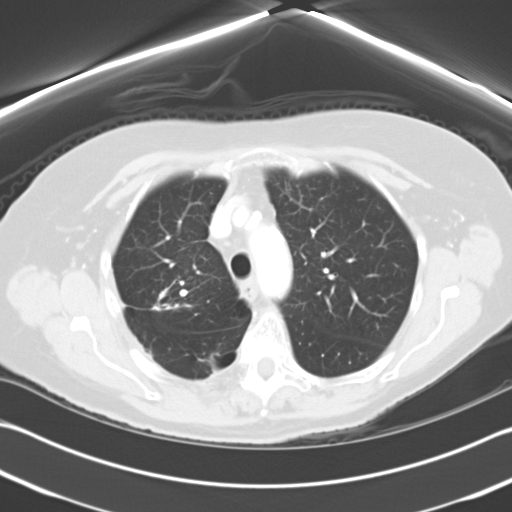
[im 56/66  lung]
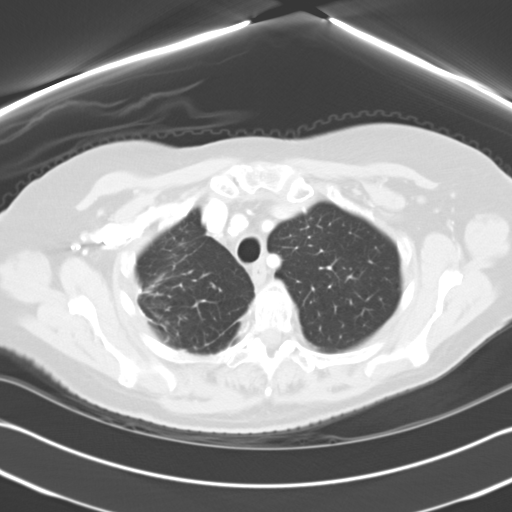
[im 61/66  lung]
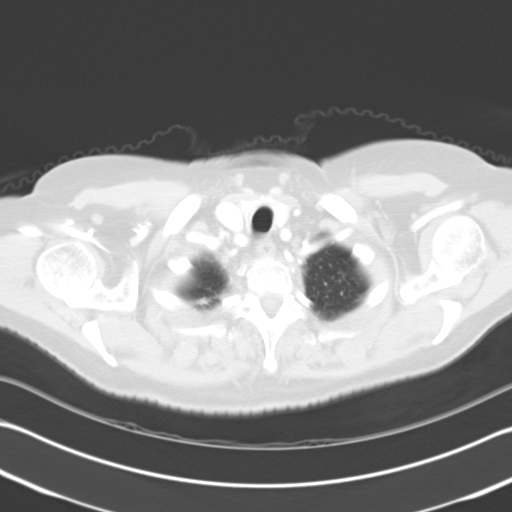

[Series 602: <mpr thick range> · coronal · 0.67mm/px · 3 of 73 slices shown]
[im 15/73  lung]
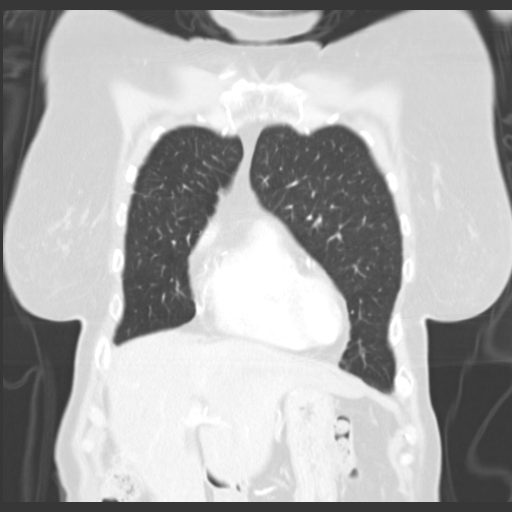
[im 29/73  lung]
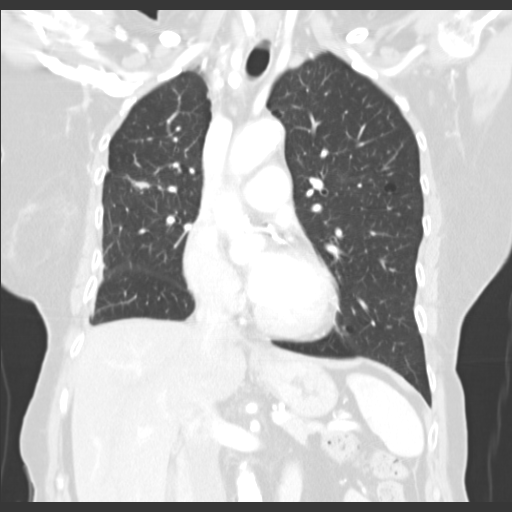
[im 44/73  lung]
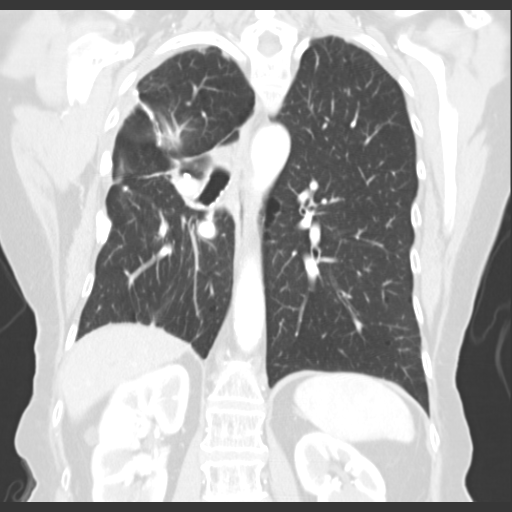

[15 of 36 positions shown; findings below may reference images not displayed]

FINDINGS: The chest wall is unremarkable.  No obvious breast
lesions, supraclavicular or axillary adenopathy.  There are a few
small scattered stable supraclavicular and axillary lymph nodes.
The heart is normal in size.  No pericardial effusion.  No enlarged
mediastinal or hilar lymph nodes.  Small scattered stable nodes.
There are extensive and dense coronary artery calcifications.
Stable aortic calcifications.  No focal aneurysm or dissection.
The esophagus is grossly normal and stable.

Examination of the lung parenchyma demonstrates stable surgical
scarring changes in the right lung with a large air cavity adjacent
to the staple line posteriorly.  No definite CT findings for
recurrent tumor.  No metastatic pulmonary nodules are seen.  Stable
scarring change at the right lung base.  Stable mild underlying
changes of COPD.

No destructive bony changes or spinal canal compromise.
Osteoporosis is again noted.

Examination of the upper abdomen demonstrates diffuse fatty
infiltration of the liver.  Stable atherosclerotic changes
involving the aorta.  No upper abdominal masses, adenopathy or
metastatic disease.
IMPRESSION: 1.  No CT findings to suggest recurrent tumor or pulmonary
metastatic disease.  Stable surgical changes in the right lung.
2.  No mediastinal or hilar adenopathy.
3.  Dense coronary artery calcifications.
4.  No upper abdominal metastatic disease.

## 2009-04-29 ENCOUNTER — Ambulatory Visit: Payer: Self-pay | Admitting: Internal Medicine

## 2009-05-02 ENCOUNTER — Ambulatory Visit (HOSPITAL_COMMUNITY): Admission: RE | Admit: 2009-05-02 | Discharge: 2009-05-02 | Payer: Self-pay | Admitting: Internal Medicine

## 2009-05-02 LAB — COMPREHENSIVE METABOLIC PANEL
Alkaline Phosphatase: 124 U/L — ABNORMAL HIGH (ref 39–117)
Glucose, Bld: 277 mg/dL — ABNORMAL HIGH (ref 70–99)
Sodium: 136 mEq/L (ref 135–145)
Total Bilirubin: 0.4 mg/dL (ref 0.3–1.2)
Total Protein: 6.7 g/dL (ref 6.0–8.3)

## 2009-05-02 LAB — CBC WITH DIFFERENTIAL/PLATELET
BASO%: 0.6 % (ref 0.0–2.0)
LYMPH%: 11.9 % — ABNORMAL LOW (ref 14.0–49.7)
MCHC: 34 g/dL (ref 31.5–36.0)
MCV: 90.8 fL (ref 79.5–101.0)
MONO%: 3.8 % (ref 0.0–14.0)
Platelets: 149 10*3/uL (ref 145–400)
RBC: 4.58 10*6/uL (ref 3.70–5.45)

## 2009-05-10 ENCOUNTER — Ambulatory Visit: Payer: Self-pay | Admitting: Thoracic Surgery

## 2009-08-29 ENCOUNTER — Ambulatory Visit: Payer: Self-pay | Admitting: Internal Medicine

## 2009-09-02 ENCOUNTER — Ambulatory Visit (HOSPITAL_COMMUNITY)
Admission: RE | Admit: 2009-09-02 | Discharge: 2009-09-02 | Payer: Self-pay | Source: Home / Self Care | Admitting: Internal Medicine

## 2009-09-02 LAB — CBC WITH DIFFERENTIAL/PLATELET
BASO%: 0.1 % (ref 0.0–2.0)
LYMPH%: 9.9 % — ABNORMAL LOW (ref 14.0–49.7)
MCHC: 33.7 g/dL (ref 31.5–36.0)
MCV: 90.8 fL (ref 79.5–101.0)
MONO#: 0.3 10*3/uL (ref 0.1–0.9)
MONO%: 3 % (ref 0.0–14.0)
Platelets: 196 10*3/uL (ref 145–400)
RBC: 4.3 10*6/uL (ref 3.70–5.45)
WBC: 8.5 10*3/uL (ref 3.9–10.3)

## 2009-09-02 LAB — COMPREHENSIVE METABOLIC PANEL
ALT: 23 U/L (ref 0–35)
Alkaline Phosphatase: 81 U/L (ref 39–117)
Sodium: 139 mEq/L (ref 135–145)
Total Bilirubin: 0.4 mg/dL (ref 0.3–1.2)
Total Protein: 6.5 g/dL (ref 6.0–8.3)

## 2009-09-04 ENCOUNTER — Ambulatory Visit: Payer: Self-pay | Admitting: Thoracic Surgery

## 2010-02-26 ENCOUNTER — Ambulatory Visit: Payer: Self-pay | Admitting: Internal Medicine

## 2010-02-28 ENCOUNTER — Ambulatory Visit (HOSPITAL_COMMUNITY)
Admission: RE | Admit: 2010-02-28 | Discharge: 2010-02-28 | Payer: Self-pay | Source: Home / Self Care | Attending: Internal Medicine | Admitting: Internal Medicine

## 2010-02-28 LAB — CBC WITH DIFFERENTIAL/PLATELET
BASO%: 0.3 % (ref 0.0–2.0)
Basophils Absolute: 0 10*3/uL (ref 0.0–0.1)
EOS%: 1.3 % (ref 0.0–7.0)
Eosinophils Absolute: 0.1 10*3/uL (ref 0.0–0.5)
HCT: 43 % (ref 34.8–46.6)
HGB: 14.7 g/dL (ref 11.6–15.9)
LYMPH%: 22.2 % (ref 14.0–49.7)
MCH: 29.7 pg (ref 25.1–34.0)
MCHC: 34.2 g/dL (ref 31.5–36.0)
MCV: 86.9 fL (ref 79.5–101.0)
MONO#: 0.6 10*3/uL (ref 0.1–0.9)
MONO%: 8.8 % (ref 0.0–14.0)
NEUT#: 4.2 10*3/uL (ref 1.5–6.5)
NEUT%: 67.4 % (ref 38.4–76.8)
Platelets: 199 10*3/uL (ref 145–400)
RBC: 4.95 10*6/uL (ref 3.70–5.45)
RDW: 13.2 % (ref 11.2–14.5)
WBC: 6.3 10*3/uL (ref 3.9–10.3)
lymph#: 1.4 10*3/uL (ref 0.9–3.3)

## 2010-02-28 LAB — CMP (CANCER CENTER ONLY)
ALT(SGPT): 31 U/L (ref 10–47)
AST: 28 U/L (ref 11–38)
Albumin: 3.8 g/dL (ref 3.3–5.5)
Alkaline Phosphatase: 98 U/L — ABNORMAL HIGH (ref 26–84)
BUN, Bld: 16 mg/dL (ref 7–22)
CO2: 30 mEq/L (ref 18–33)
Calcium: 9.5 mg/dL (ref 8.0–10.3)
Chloride: 98 mEq/L (ref 98–108)
Creat: 0.8 mg/dl (ref 0.6–1.2)
Glucose, Bld: 171 mg/dL — ABNORMAL HIGH (ref 73–118)
Potassium: 4.4 mEq/L (ref 3.3–4.7)
Sodium: 148 mEq/L — ABNORMAL HIGH (ref 128–145)
Total Bilirubin: 0.5 mg/dl (ref 0.20–1.60)
Total Protein: 7.4 g/dL (ref 6.4–8.1)

## 2010-03-05 ENCOUNTER — Ambulatory Visit
Admission: RE | Admit: 2010-03-05 | Discharge: 2010-03-05 | Payer: Self-pay | Source: Home / Self Care | Attending: Thoracic Surgery | Admitting: Thoracic Surgery

## 2010-03-08 ENCOUNTER — Other Ambulatory Visit: Payer: Self-pay | Admitting: Internal Medicine

## 2010-03-08 DIAGNOSIS — C3490 Malignant neoplasm of unspecified part of unspecified bronchus or lung: Secondary | ICD-10-CM

## 2010-03-09 ENCOUNTER — Encounter: Payer: Self-pay | Admitting: Internal Medicine

## 2010-03-09 ENCOUNTER — Encounter: Payer: Self-pay | Admitting: Endocrinology

## 2010-03-09 ENCOUNTER — Encounter: Payer: Self-pay | Admitting: Thoracic Surgery

## 2010-03-19 ENCOUNTER — Encounter: Payer: Self-pay | Admitting: Family Medicine

## 2010-04-16 ENCOUNTER — Ambulatory Visit: Payer: Self-pay | Admitting: Thoracic Surgery

## 2010-07-01 NOTE — Letter (Signed)
May 30, 2008   Lajuana Matte, MD  [phone] [address]  [place], [place] [address]   Re:  Marcia [name], [name]                  DOB:  [birth year]   Dear Marcia [name],   I saw the patient in the office today and reviewed her last CT scan.  I  was very satisfied with the result, with no evidence of recurrence.  She  does have that posterior airspace which we need to keep an eye on.  Her  blood pressure was 127/72, pulse 75, respirations 18, sats were 96%.  I  will see her back again in July after her next CT scan.   Sincerely,   Ines Bloomer, M.D.  Electronically Signed   DPB/MEDQ  D:  05/30/2008  T:  05/31/2008  Job:  [id number]

## 2010-07-01 NOTE — H&P (Signed)
NAME:  Marcia [name], Marcia [name]                  ACCOUNT NO.:  [id number]   MEDICAL RECORD NO.:  [id number]          PATIENT TYPE:  INP   LOCATION:  NA                           FACILITY:  [facility]   PHYSICIAN:  Ines Bloomer, M.D. DATE OF BIRTH:  [birth year]   DATE OF ADMISSION:  DATE OF DISCHARGE:                              HISTORY & PHYSICAL   CHIEF COMPLAINT:  Lung mass.   HISTORY OF PRESENT ILLNESS:  This 76 year old Caucasian female was found  to have a small right upper lobe malignancy on March 09, 2005, and had  a wedge resection of this lesion, non-small cell lung cancer that was  poorly differentiated adenocarcinoma.  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  In her followup CT scan, she  was found to have a right lower lobe lesion that has slowly increased in  size.  A PET scan was done and showed that this was about 16 mm in the  right lower lobe.  She has had no fever, chills, or excessive sputum.  No weight loss.  No other systemic problems.  Her pulmonary function  tests showed an FVC of 1.88 and FEV1 of 1.2.  She is readmitted for  reexcision of this right lower lobe lesion.   MEDICATIONS:  Include:  1. Simvastatin 40 mg a day.  2. Aspirin 81 mg a day.  3. Fosamax.  4. Calcium-D.  5. Folate.  6. Prednisone 5 mg a day.  7. Humalog R.  8. Zetia 10 mg a day.  9. Halcion 12.5 mg daily.  10.Hydrochlorothiazide 12.5 mg daily.  11.Synthroid 50 mcg daily.   ALLERGIES:  CECLOR, VICODIN, AUGMENTIN, and SULFA.   FAMILY HISTORY:  Noncontributory.   SOCIAL HISTORY:  She is married, has 3 children, works as a Futures trader.  Quit smoking in December 2006, does not drink alcohol on a regular  basis.   PAST MEDICAL HISTORY:  She has osteoporosis, hypertension, and diabetes  mellitus type 2.   REVIEW OF SYSTEMS:  She is 125 pounds.  She is 5 feet 5 inches.  CARDIAC:  No angina or  atrial fibrillation.  PULMONARY:  See history of  present illness.  GASTROINTESTINAL: She has a hiatal hernia.  GENITOURINARY:  No dysuria or frequent urination.  VASCULAR:  No  claudication, DVT, and TIAs.  NEUROLOGIC:  No dizziness, headaches,  blackouts, or seizures.  MUSCULOSKELETAL:  She has got arthritis and  joint pain.  PSYCHIATRIC:  No depression.  NOSE, EYES/ENT:  No change in  her eyesight or hearing.  HEMATOLOGIC:  No problems with bleeding or  clotting disorders.   PHYSICAL EXAM:  GENERAL:  She is a well-developed Caucasian female in no  acute distress.  VITAL SIGNS:  Her blood pressure is 135/69, pulse 92, respirations 18,  and sats are 95%.  HEAD, EYES, EARS, NOSE AND THROAT:  Unremarkable.  NECK:  Supple without thyromegaly.  There is no supraclavicular or  axillary adenopathy.  CHEST:  Clear to auscultation and percussion.  There is  a right  thoracotomy scar.  HEART:  Regular sinus rhythm.  No murmurs.  ABDOMEN:  Soft.  No hepatosplenomegaly.  EXTREMITIES:  Pulses 2+.  There is no clubbing or edema.  NEUROLOGICAL:  She is oriented x3.  Sensory and motor intact.   IMPRESSION:  1. Enlarging right lower lobe lesion, status post adenocarcinoma of      the right upper lobe, stage IA.  2. Osteoporosis.  3. Hypertension.  4. Diabetes mellitus type 2.   PLAN:  Right VAT resection of right lower lobe lesion, possible right  lower lobectomy.      Ines Bloomer, M.D.  Electronically Signed     DPB/MEDQ  D:  06/10/2007  T:  06/11/2007  Job:  [id number]

## 2010-07-01 NOTE — Letter (Signed)
Jun 28, 2007   Lajuana Matte, MD  [phone]  [place], [place] [address]   Re:  [name], [name]                  DOB:  Mar 28, 1934   Dear [name]:   The patient came today and I removed her chest tube sutures.  Her x-ray  looks good.  Chest x-ray just shows postoperative changes.  She is doing  well overall.  I told her she could gradually increase her activities  and we will see her back again in 3 weeks with another chest x-ray.  Her  blood pressure is 140/68, pulse 94, respirations 18, sats were 96%.  Lungs were clear to auscultation and percussion.   Ines Bloomer, M.D.  Electronically Signed   DPB/MEDQ  D:  06/28/2007  T:  06/28/2007  Job:  [id number]

## 2010-07-01 NOTE — Letter (Signed)
January 23, 2009   Lajuana Matte, MD  [phone] [address]  [place], [place]  [address]   Re:  CAHTERINE, HEINZEL                  DOB:  [birth year]   Dear [name],   I saw the patient in the office today after we reviewed her CT scan  result, showed no evidence of recurrence for cancer.  She still has a  loculated posterior cyst, but that has remained stable.  She is doing  well overall.  She is now 9 months since her previous surgery.  Her  blood pressure was 127/66, pulse 84, respirations 16, sats were 98%.  Plan to see her back again after next CT scan.   Ines Bloomer, M.D.  Electronically Signed   DPB/MEDQ  D:  01/23/2009  T:  01/24/2009  Job:  [id number]

## 2010-07-01 NOTE — Letter (Signed)
May 10, 2009   Lajuana Matte, MD  [phone]  [place], [place] [address]   Re:  Marcia [name], KOVACIK                  DOB:  01/17/1935   Dear Dr. [name]:   The patient returns today.  She is now 2 years since we did her last  surgery, a right lower lobe superior segmentectomy.  She is doing well  overall.  The CT scan today showed no evidence of recurrence.  She is  now 2 years since her second cancer and 4 years since her first cancer.  We plan to see her back again in 4 months with another CT scan.  Her  blood pressure was 119/64, pulse 83, respirations 18, and sats were 96%.   Ines Bloomer, M.D.  Electronically Signed   DPB/MEDQ  D:  05/10/2009  T:  05/11/2009  Job:  [id number]

## 2010-07-01 NOTE — Letter (Signed)
September 04, 2009   Velora Heckler. [name], MD  [address]  [place], [place] [address]   Re:  Marcia [name], UPHOFF                  DOB:  1934-04-14   Dear [name],   I saw the patient in the office today and her CT scan showed no evidence  of recurrence of her cancer.  She is now over 2 years since her last  surgery of what they thought was a small foramen of [place] hernia with  some small fat in that.  I am not sure that is really a true defect, but  it is very small.  There was nothing that needs to be done.  She will  have a repeat CT scan in 6 months.  Her blood pressure was 114/54, pulse  79, respirations 18, sats were 96%.  I will see her back again at that  time.   Ines Bloomer, M.D.  Electronically Signed   DPB/MEDQ  D:  09/04/2009  T:  09/05/2009  Job:  [id number]

## 2010-07-01 NOTE — Letter (Signed)
February 23, 2007   Mohamed K. [name], MD  [address]  [place], [place] [address]   Re:  Marcia [name], ONSTAD                  DOB:  08/25/1934   Dear Dr. [name]:   I saw Marcia [name] back in the office today.  Her CT scan, I reviewed and  the lesion in the right lower lobe is stable at 13 mm.  It is actually  down from 14 mm, so it at least has not changed and no evidence of any  other adenopathy.  She is doing well now almost 2 years since her  surgery.  I will see her back again in April with the next CT scan.  Her  blood pressure is 135/69, pulse is 92, respirations 18, and sats were  95%.   Sincerely,   Marcia [name], M.D.  Electronically Signed   DPB/MEDQ  D:  02/23/2007  T:  02/23/2007  Job:  [id number]

## 2010-07-01 NOTE — Assessment & Plan Note (Signed)
OFFICE VISIT   [name], [name]  DOB:  [birth year]                                        November 30, 2007  CHART #:  [id number]   The patient is status post right thoracotomy with right lower lobe  superior segmentectomy done by Dr. 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  The  patient presents today for followup visit.  She has finished her  chemotherapy by Dr. [name].  She states she is doing well.  She denies  any shortness of breath or chest pain.  She is up ambulating without  difficulty.  Dr. [name] plan to check a CT scan of the chest in 3  months.   PHYSICAL EXAMINATION:  VITAL SIGNS:  Blood pressure 145/72, pulse of 86,  respirations of 18, and O2 sat 94% on room air.  RESPIRATORY:  Clear to auscultation bilaterally.  CARDIAC:  Regular rate and rhythm.   STUDIES:  The patient had chest x-ray done today which is stable with no  changes.   IMPRESSION AND PLAN:  The patient is progressing well.  She was seen and  evaluated by Dr. Edwyna Shell.  Dr. Edwyna Shell evaluated the patient's chest x-  ray.  We will plan to follow back up with the patient in 3 months after  she has her CT scan done by Dr. [name].  The patient is told to contact  us in the interim with any questions.   Marcia [name], M.D.  Electronically Signed   KMD/MEDQ  D:  11/30/2007  T:  12/01/2007  Job:  [id number]   cc:   Lajuana Matte, MD

## 2010-07-01 NOTE — Letter (Signed)
November 04, 2006   Si Gaul, MD.  [phone] [address]  [place], [place] [address]   Re:  [name], [name]                  DOB:  1934/05/08   Dear [name],   The patient came today and her CT scan showed a lesion in the right  lower lobe that is about 1.0 in size. This could be cancer versus just  an inflammatory or scarring. It is a little too small to get a PET scan  or do a lung biopsy. It has slightly increased in size since we saw her  last.   We did pulmonary spirometry today and her [name] is 1.88 with an FEV-1 of  0.93. We will  have to get full function tests if she continues to have  increase in size. I recommend that we repeat this again in 3 months, and  if it does turn out to be increased in size more, then I would suggest a  wedge resection. I did explain to them that a repeat surgery would  require rib resection and would be much more difficult than her first  surgery.   I will see her again in January and make a decision at that time. I do  think if this continues to increase, then will need to definitely do the  surgery in January.   Marcia [name], M.D.  Electronically Signed   DPB/MEDQ  D:  11/04/2006  T:  11/04/2006  Job:  [id number]   cc:   Demetria Pore. Coral Spikes, M.D.

## 2010-07-01 NOTE — Letter (Signed)
May 27, 2007   Lajuana Matte, MD  [phone] [address]  [place], [place] [address]   Re:  Marcia [name], ZAHRADNIK                  DOB:  [birth year]   Dear Marcia [name],   I saw [name] back today, and we discussed the increasing size of her  left lower lobe lesion.  She will be given a PET scan, to occur on  Friday, and I will also order a full set of pulmonary function tests  since her last pulmonary function tests were somewhat borderline.  I am  afraid she is looking towards another procedure to remove this left  lower lobe lesion, which will probably be another adenocarcinoma.  I  discussed the situation in detail with [name] and will call her next  week after we see her PET scan and her PFTs.   Her blood pressure is 128/60, pulse 88, respirations 18, sats 96%.   Ines Bloomer, M.D.  Electronically Signed   DPB/MEDQ  D:  05/27/2007  T:  05/27/2007  Job:  [id number]

## 2010-07-01 NOTE — Assessment & Plan Note (Signed)
OFFICE VISIT   [name], [name]  DOB:  14-Mar-1934                                        August 31, 2007  CHART #:  [id number]   The patient comes in today for a 6-week followup.  She is status post a  right lower lobe superior segmentectomy by Dr. Edwyna Shell on 06/13/2007.  She has recently completed her first cycle of chemotherapy and starts  her second cycle next week.  She states that Dr. [name] was pleased  with her progress on the first cycle and her only significant  complications were some leukopenia and has some other changes in her  blood counts, but these are resolving.  She is otherwise done very well.  She has had no significant shortness of breath and otherwise is  progressing nicely.   On physical exam blood pressure is 119/72, pulse was 87, respirations  20, and O2 sat 96% on room air.  Her right thoracotomy incision has  healed well.  Heart is regular rate and rhythm without murmurs, rubs, or  gallops.  Lungs are clear to auscultation.   Chest x-ray, which is also reviewed by Dr. Edwyna Shell today is stable.   ASSESSMENT AND PLAN:  The patient is doing well status post right lower  lobe superior segmentectomy.  She will proceed with her second round of  chemotherapy as per Dr. [name].  We will plan to see her back in 3  months with a repeat chest x-ray.   Ines Bloomer, M.D.  Electronically Signed   GC/MEDQ  D:  08/31/2007  T:  09/01/2007  Job:  [id number]   cc:   Lajuana Matte, MD

## 2010-07-01 NOTE — Op Note (Signed)
Marcia [name], CARIGNAN                  ACCOUNT NO.:  [id number]   MEDICAL RECORD NO.:  [id number]          PATIENT TYPE:  INP   LOCATION:  2303                         FACILITY:  [facility]   PHYSICIAN:  Ines Bloomer, M.D. DATE OF BIRTH:  [birth year]   DATE OF PROCEDURE:  DATE OF DISCHARGE:                               OPERATIVE REPORT   PREOPERATIVE DIAGNOSIS:  1. Right lower lobe mass.   POSTOPERATIVE DIAGNOSIS:  1. Adenocarcinoma, right lower lobe.   OPERATION PERFORMED:  1. Right thoracotomy.  2. Right lower lobe superior segmentectomy.  3. Node dissection.   SURGEON:  Ines Bloomer, MD   FIRST ASSISTANT:  Theda Belfast, [place]   ANESTHESIA:  General anesthesia.   PROCEDURE IN DETAIL:  This patient had a previous right upper lobe  adenocarcinoma removed and developed a new lesion in the superior  segment of the right lower lobe.  She was brought to the operating room  and underwent general anesthesia.  A dual-lumen tube was inserted.  The  patient was turned to the right lateral thoracotomy position.  She was  prepped and draped in the usual sterile manner.  The previous  thoracotomy incision was opened, and latissimus was re-divided and  serratus was reflected anteriorly and we then did a subperiosteal  resection on the fifth rib using an Alexander retractor.  This allowed  [place] to enter the space, and we took down the adhesions of the lower lobe  with sharp and blunt dissection and then identified the fissure and  dissected the fissure identifying the pulmonary artery.  Cancer was  palpable in the superior segment of the right lower lobe, so we tried to  do a right lower lobe segmentectomy.  We did not take down the upper  lobe or the middle lobe adhesions and just concentrated on the right  lower lobe.  Dissection was started posteriorly.  We dissected the  posterior mediastinum and started dissecting down to the pulmonary  artery and dissected out the superior  segmental artery and ligated with  2-0 silk proximally and distally and clipped it.  Then, that exposed the  bronchus to the right lower lobe.  Because it was curopalliative, we  came across this with the auto sutures and a 60 stapler as well as the  EZ45 stapler and resected the bronchus as well as resecting the  posterior portion of this fissure.  Since the upper portion of the major  fissure was stuck, we divided that with the EZ45 stapler and then  sutured the incision with 3-0 Vicryl and applied  CoSeal to the incision.  A right-angled chest tube was inserted.  Chest  was closed.  A Marcaine block was done in the usual fashion.  A single  On-Q was inserted in the usual fashion.  Chest was closed with 6  pericostals, #1 Vicryl in the muscle layer, 2-0 Vicryl in subcutaneous  tissue, and Ethicon and Dermabond to the skin.      Ines Bloomer, M.D.  Electronically Signed     DPB/MEDQ  D:  06/13/2007  T:  06/14/2007  Job:  [id number]   cc:   Lajuana Matte, MD

## 2010-07-01 NOTE — Assessment & Plan Note (Signed)
OFFICE VISIT   Lutter, Tiauna T  DOB:  1934/02/20                                        March 15, 2008  CHART #:  [id number]   The patient came for followup today.  She is now 9 months since we did a  right lower lobe superior segmentectomy.  Her CT scan done in December  showed no evidence of recurrence.  She is doing well overall.  Her blood  pressure is 115/69, pulse 81, respirations 18, and sats are 92%.  I will  plan to see her back again in 3 months with another after a next CT.   Ines Bloomer, M.D.  Electronically Signed   DPB/MEDQ  D:  03/15/2008  T:  03/15/2008  Job:  [id number]

## 2010-07-01 NOTE — Procedures (Signed)
CAROTID DUPLEX EXAM   INDICATION:  Right carotid bruit.   HISTORY:  Diabetes:  Yes.  Cardiac:  No.  Hypertension:  No.  Smoking:  Quit.  Previous Surgery:  No carotid surgeries.  CV History:  No.  Amaurosis Fugax No, Paresthesias No, Hemiparesis No                                       RIGHT             LEFT  Brachial systolic pressure:         130               125  Brachial Doppler waveforms:         Triphasic         Triphasic  Vertebral direction of flow:        Antegrade         Antegrade  DUPLEX VELOCITIES (cm/sec)  CCA peak systolic                   95                109  ECA peak systolic                   122               85  ICA peak systolic                   91                106  ICA end diastolic                   20                20  PLAQUE MORPHOLOGY:                  Calcific          Calcific  PLAQUE AMOUNT:                      Very mild         Very mild  PLAQUE LOCATION:                    Bifurcation       Bifurcation   IMPRESSION:  1. Bilateral internal carotid artery stenosis of 1-19%.  2. Bilateral internal carotid arteries are somewhat tortuous, right >      left, without hemodynamic significance.   ___________________________________________  Janetta Hora. Fields, MD   AS/MEDQ  D:  07/25/2007  T:  07/25/2007  Job:  [id number]

## 2010-07-01 NOTE — Assessment & Plan Note (Signed)
OFFICE VISIT   [place], Nickayla T  DOB:  1934/10/06                                        October 10, 2008  CHART #:  [id number]   The patient returns today and her CT scan showed no evidence of  recurrence of her cancer.  She still has a small 3.3-cm airspace that  has been stable, but we will need to continue to watch it.  She is going  to get another CT scan in 4 months and I will see her back again at that  time.  There is complaint of easy bruisability, there is no other  problems.  Her blood pressure is 120/70, pulse 78, respirations 18, sats  were 97%.   Ines Bloomer, M.D.  Electronically Signed   DPB/MEDQ  D:  10/10/2008  T:  10/10/2008  Job:  [id number]

## 2010-07-01 NOTE — Assessment & Plan Note (Signed)
OFFICE VISIT   [name], [name]  DOB:  January 02, 1935                                        July 20, 2007  CHART #:  [id number]   HISTORY OF PRESENTING ILLNESS:  Marcia [name] is a 76 year old Caucasian  female who was found to have a right lower lobe mass in January 2007.  She underwent wedge resection of this lesion and was diagnosed with non-  small cell lung cancer (poorly differentiated adenocarcinoma).  This was  complicated by a chylothorax.  A CAT scan that was performed later  showed that the patient had a right lower lobe lesion that was slowly  increasing in size and was positive on PET scan.  She then underwent a  right thoracotomy, right lower lobe superior segmentectomy with lymph  node dissection by Dr. Edwyna Shell on June 13, 2007.  Pathology showed this  to be mixed small cell carcinoma and adenocarcinoma.  The patient was  last seen in the office on Jun 28, 2007.  Chest x-ray at that time  showed resolution of previous right basilar hydropneumothorax, posterior  changes on the right and a 12 x 20 mm oval opacity on the lateral view  (most likely loculated pleural fluid)  but no metastatic disease seen.   The patient denies any shortness of breath.  Her only complaint is  occasional incisional pain.  She continues to increase her activities as  tolerated.   PHYSICAL EXAMINATION:  GENERAL:  This is a pleasant 76 year old  Caucasian female in no acute distress who is alert, oriented and  cooperative.  VITAL SIGNS:  Blood pressure is 130/71, pulse rate 90, respiration 18,  O2 sat 96% on room air.  CARDIOVASCULAR:  Rate and rhythm.  LUNGS:  Clear to auscultation bilaterally.  Right posterior chest wounds  well-healed.  No erythema.   Chest x-ray today showed again post-surgical changes seen in the right  hemithorax.  The left lung is clear.  Volume loss in the right  hemithorax is stable and a nodule that was overlying the posterior heart  on previous  film is no longer seen.  No evidence of metastatic disease.   IMPRESSION AND PLAN:  The patient is surgically stable, continuing to  recover from the aforementioned lung surgery.  She is scheduled to  undergo chemotherapy next week.  She will return to see Dr. Edwyna Shell in 6  weeks with a chest x-ray.   Doree Fudge, PA   DZ/MEDQ  D:  07/20/2007  T:  07/20/2007  Job:  [id number]

## 2010-07-01 NOTE — Assessment & Plan Note (Signed)
OFFICE VISIT   Laidler, Calyn T  DOB:  November 29, 1934                                        March 05, 2010  CHART #:  [id number]   His blood pressure was 148/62, respirations 18, and sats were 96%.  She  is doing well overall.  Her CT scan showed no evidence of recurrence.  She will see Dr. [name] today and he will probably reschedule her CT  scan in probably 6 months.  We will see her back again at that time.   Ines Bloomer, M.D.  Electronically Signed   DPB/MEDQ  D:  03/05/2010  T:  03/06/2010  Job:  [id number]

## 2010-07-01 NOTE — Discharge Summary (Signed)
Marcia [name], Marcia [name]                  ACCOUNT NO.:  [id number]   MEDICAL RECORD NO.:  [id number]          PATIENT TYPE:  INP   LOCATION:  2039                         FACILITY:  [facility]   PHYSICIAN:  Theda Belfast, PA DATE OF BIRTH:  08-15-1934   DATE OF ADMISSION:  06/13/2007  DATE OF DISCHARGE:                               DISCHARGE SUMMARY   FINAL DIAGNOSES:  Right lower lobe lesion, non-small cell carcinoma, T2  N0 MX.   SECONDARY DIAGNOSES:  1. Hyperlipidemia.  2. Osteoporosis.  3. Diabetes.  4. Hypothyroidism.  5. Hypertension.   OPERATIONS AND PROCEDURES:  Right thoracotomy with superior  segmentectomy of right lower lobe with lymph node biopsy and lobe  resection.   THE PATIENT'S HISTORY AND PHYSICAL AND HOSPITAL COURSE:  The patient is  a 76 year old Caucasian female who was found to have a right lower lobe  malignancy in January 2007.  The patient underwent wedge resection of  this lesion and was diagnosed with non-small cell lung cancer that was  poorly differentiated adenocarcinoma.  She has been followed closely  since then.  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  In a followup CT scan, she was found to have right  lower lobe lesion that was slowly increasing in size.  PET scan done,  showed it to be positive.  The patient denies fever, chills, or  excessive sputum.  Pulmonary function tests showed an FVC of 1.88 and  FEV-1 of 1.2.  The patient was seen and evaluated by Dr. Edwyna Shell.  Dr.  Edwyna Shell discussed with the patient undergoing resection of the right  lower lobe lesion.  He discussed risks and benefits with the patient.  The patient nods her understanding, agreed to proceed.  Surgery was  scheduled for June 13, 2007.  For details of the patient's past medical  history and physical exam, please see dictated H&P.   The patient was taken to the operating room on June 13, 2007, where she  underwent right thoracotomy with  superior segmentectomy of right lower  lobe with lymph node biopsy and lobe resection.  The patient tolerated  this procedure well, and she was transferred to the intensive care unit  in stable condition.  The patient's postoperative course was pretty much  unremarkable.  She was able to be extubated following surgery.  Post  extubation, she was noted to be alert and oriented x4.  Neurologically  intact.  Daily chest x-rays obtained.  They remained stable, no air leak  noted from Pleur-evac.  Posterior chest tube discontinued on June 16, 2007, with remaining chest tube discontinued on Jun 18, 2007.  Followup  chest x-ray remained stable with no pneumothorax.  The patient's  pathology report came back positive showing mixed small cell carcinoma  and adenocarcinoma with T2, N0, MX.  During the patient's postoperative  course, vital signs were monitored.  She remained afebrile.  Heart rate  and blood pressure stable.  Blood sugars were also monitored  postoperatively secondary to the patient's history of diabetes.  She was  started on all  home diabetic medication.  She did develop acute blood  loss anemia with hemoglobin and hematocrit dropping to 7.6 and 21.8 on  postoperative day #4.  She received 2 units of packed red blood cells.  She was also started on Aranesp.  Repeat hemoglobin and hematocrit  improved appropriately and by Jun 19, 2007, hematocrit was 30%.  The  patient was out of bed ambulating well without difficulty.  She was  tolerating diet well.  No nausea, vomiting noted.  All incisions were  clean, dry, and intact, and healing well.   The patient is tentatively ready for discharge home on Jun 20, 2007.   FOLLOWUP APPOINTMENTS:  A followup appointment will be arranged with Dr.  Edwyna Shell in 1 week.  Our office will contact the patient with this  information.  The patient will need to obtain PA and lateral chest x-ray  30 minutes prior to this appointment.   ACTIVITY:  The  patient instructed no driving until released to do so, no  lifting over 10 pounds.  She is told to ambulate 3 to 4 times per day.  Progress as tolerated and continue her breathing exercises.   INCISIONAL CARE:  The patient was told to shower, washing her incisions  using soap and water.  She is to contact the office if she develops any  drainage or opening from any of her incision sites.   DIET:  The patient is educated on diet to be low fat and low salt.   DISCHARGE MEDICATIONS:  1. Vitamin D weekly.  2. Insulin 2 units before meals.  3. Insulin 30 units daily.  4. Folic acid 1 mg daily.  5. Zetia 10 mg daily.  6. Fosamax weekly.  7. Synthroid 0.5 mg daily.  8. Simvastatin 40 mg daily.  9. Omeprazole 20 mg daily.  10.Prednisone 5 mg daily.  11.Aspirin 81 mg daily.  12.Triazolam 0.25 mg one-half tablet at night.  13.Hyoscyamine sulfate 0.375 mg b.i.d.  14.Lasix 20 mg daily.  15.Ferrous sulfate 325 mg t.i.d.  16.Darvocet-N 100, 1-2 tablets every 6 hours p.r.n.      Theda Belfast, PA     KMD/MEDQ  D:  06/19/2007  T:  06/19/2007  Job:  [id number]

## 2010-07-04 NOTE — Discharge Summary (Signed)
NAMEFRIMET, DURFEE                  ACCOUNT NO.:  [id number]   MEDICAL RECORD NO.:  [id number]          PATIENT TYPE:  INP   LOCATION:  3013                         FACILITY:  [facility]   PHYSICIAN:  Tera Mater. Evlyn Kanner, M.D. DATE OF BIRTH:  1934/10/11   DATE OF ADMISSION:  03/21/2006  DATE OF DISCHARGE:  03/24/2006                               DISCHARGE SUMMARY   DISCHARGE DIAGNOSES:  1. Acute confusional episode, resolved, with no evidence of stroke,      possibly related to low blood sugar.  2. Febrile illness, probably sinusitis induced with negative cultures      and good response to antibiotics.  3. Mild reduction in white cell count, possible viral infection.  4. Type 1 diabetes with fair blood sugar control.  5. History of lung cancer; no evidence of metastases.  6. Minor thrombocytopenia, clinically improved.  7. Rheumatoid arthritis.  No evidence of septic joints.   PROCEDURES:  Include a CT of the head and MRI of the head.   HISTORY OF PRESENT ILLNESS:  Ms. Marcia [name] is a 76 year old white female with  longstanding type 1 diabetes and rheumatoid arthritis who presented to  my partner, Dr. Waynard Edwards, with altered mental status with confusion that  had resolved.  Earlier in the day she had had difficulties with giving  herself insulin and seemed confused about the overall management of her  diabetes.  Blood sugars had been bouncing around in an unpredictable  manner for two days prior to that.  My current feeling is that possibly  that she had undetected hypoglycemia which lead to this confusion.  The  patient has had a stable mental status since coming here.  Her CT and  MRI both showed no evidence of acute or chronic strokes.  There  certainly was no evidence of metastatic disease which was also in  consideration.  At the present time, her mental status is back 100% to  normal.  Second issue is that on hospital #2 she spiked a fever to  102.3.  A full workup has found that urine was  slightly abnormal with a  normal culture.  Blood cultures are negative.  Chest x-ray is negative,  and MRI did show sinusitis.  It is possible that this is a viral  illness, however, with her low white count.  There is no evidence of any  significant organ dysfunction nor SIRS type of problem.  In fact, the  patient is looking extremely well and ready for discharge home.  We are  going to continue antibiotics which perhaps have been helpful.  I am not  certain that we are treating a bacterial process, but she does need  coverage for this.  At the present time, the patient is eating well,  doing well, and has no complaints.   LABORATORY DATA:  White count is 3,000 this morning, hemoglobin 11.9, MC  88.2, platelets 138,000.  At presentation, the white count was 3,900,  hemoglobin 12.7, platelets 129,000.  On the 4th white count was 5,200,  hemoglobin 11.9, platelets 129,000.  This morning's chemistries:  Sodium  136, potassium 3.9, chloride 100, CO2 27, BUN 14, creatinine 0.61,  glucose of 117.  At presentation, sodium 136, potassium 3.9, chloride  98, CO2 of 30, BUN 17, creatinine 0.77, glucose 290.  Total bili was  0.6, alk phos 63, SGOT 29, SGPT 31, total protein 6.5, albumin 3.4,  calcium 9.4.  This morning's calcium is 9.5.  C-peptide interestingly  was clearly present at 1.92.  Hemoglobin A1c was elevated at 8.7.  TSH  was slightly suppressed at 0.168.  Urinalysis is yellow-cloudy, specific  gravity 1.023 with 500 mg% glucose, 40 mg% ketones, 30 mg% protein, 3-6  white cells, 0-2 red cells with many bacteria.  Blood cultures x2 on the  4th were negative and urine culture is negative.  Radiology testing  included a chest x-ray which showed stable scarring in the right  midlung, no active process, COPD.  MRI/MRA showed no acute intracranial  abnormality, moderately advanced small vessel disease, and acute and  chronic sinusitis.  A CT of the head on presentation showed no acute   intracranial findings.  Ethmoid sinus, mucosal thickening was also noted  there.   SUMMARY:  We have a 76 year old white female presenting first with a  confusional episode, then febrile illness.  I suspect this is a  hypoglycemic episode but could have been a TIA.  Patient will have  outpatient carotid exam as the final part of the workup.  At the present  time, she has done well without evidence of any new problems.  Specifically there is no evidence of any metastases from her lung  cancer.  At the present time, she is doing well and ready for discharge  home.  I suspect there is a viral component to this illness given the  low white count.  We will, however, as noted cover the antibiotics for  possible sinusitis.   MEDICATIONS AT DISCHARGE:  Will include her methotrexate 2.5 mg eight  pills a week, folic acid 1 mg daily, vitamin D pill 50,000 international  units once weekly, Lantus 25 in the morning, NovoLog premeals, Zetia 10,  Fosamax 70, calcium with D twice daily, Synthroid 50, Zocor 40,  omeprazole 20, p.r.n. hyoscyamine, HCTZ 12.5 daily, and triazolam 0.125  daily.  Her only new medicine will be Avelox 400 mg once daily for an  additional five days.   She is to come back Friday for a white count, and I am going to give a  call to her rheumatologist today to see if he wants to hold her  methotrexate.  Blood sugars will be measured as before.  Diet will be no  concentrated sweets.  No pain management is necessary.           ______________________________  Tera Mater Evlyn Kanner, M.D.     SAS/MEDQ  D:  03/24/2006  T:  03/24/2006  Job:  [id number]

## 2010-07-04 NOTE — H&P (Signed)
NAME:  Marcia, [name]                  ACCOUNT NO.:  [id number]   MEDICAL RECORD NO.:  [id number]           PATIENT TYPE:   LOCATION:                                 FACILITY:   PHYSICIAN:  Ines Bloomer, M.D.      DATE OF BIRTH:   DATE OF ADMISSION:  DATE OF DISCHARGE:                                HISTORY & PHYSICAL   CHIEF COMPLAINT:  Right upper lobe lung mass.   HISTORY OF PRESENT ILLNESS:  This is 76 year old patient has been a long-  time smoker.  She has been treated for diabetes by Dr. Tera Mater. [place] and  for rheumatoid arthritis with methotrexate by Dr. Demetria Pore. Lev[place].  In  February 2006, she had pain in her right anterior chest after a fall and had  several chest x-rays, with one in September showing an impressionable lesion  in the right lower lobe.  A repeat chest x-ray in December showed an  aggregate in the right lower lobe and a CT scan was done which showed an 8  mm nodule in the posterior segment of the right upper lobe.  A PET scan was  done and she was found to have a right upper lobe lesion with an SUV of 4.  She had no immediate hilar adenopathy.  She has had no hemoptysis, fever or  chills.  No productive sputum.  She does have some occasional wheezes and  she gets shortness of breath with exertion.  Pulmonary function tests showed  an FEV of 1.88, 60% of predicted and an FEV-I of 1.42 which is 60% of  predicted.   PAST MEDICAL HISTORY:  1.  Osteoporosis.  2.  Hypertension.  3.  Diabetes mellitus type 2.   MEDICATIONS:  1.  Insulin 10 units once daily.  2.  Lexicon 7.5 mg daily.  3.  Folic acid 1 mg daily.  4.  Methotrexate once a week 2 mg.  This has been stopped.  5.  Zetia 10 mg daily.  6.  Fosamax once a week.  7.  Omeprazole 20 mg daily for reflux.  8.  Synthroid 50  mcg daily.  9.  Lipitor 30 mg daily.  10. Has been recently treated for acute sinusitis with antibiotic and      Flonase.  __________.   FAMILY HISTORY:  Positive for cancer,  tuberculosis, sarcoidosis.  Also her  mother had cancer.   SOCIAL HISTORY:  She is married and had three children.  She is a Futures trader.  Denies __________ on a regular basis.  Quit smoking in December 2006.  Does  not drink alcohol on a regular basis.   REVIEW OF SYSTEMS:  She weighs 125 pounds.  She is 5 feet 5 inches.  She has  no cardiac, no angina, no atrial fibrillation.  PULMONARY:  As in the  history of present illness.  GI:  She has a hiatal hernia.  GENITOURINARY:  No dysuria or frequent urination.  VASCULAR:  She has tingling in her feet.  No claudication, deep venous thrombosis or TIA's.  NEUROLOGIC:  No  headaches, blackouts or seizures.  ORTHOPEDIC:  She does have rheumatoid  arthritis and severe joint pain.  PSYCHIATRIC:  No psychiatric illnesses.  EENT:  No change in her eye sight or hearing.  HEMATOLOGICAL:  No anemia or  bleeding disorders.   PHYSICAL EXAMINATION:  GENERAL:  She is a thin Caucasian female in no acute  distress.  VITAL SIGNS:  Blood pressure 132/80, pulse 100, respirations 18, saturation  95%.  HEENT:  Head atraumatic.  Eyes:  Pupils equal, reactive to light and  accommodation.  Extraocular movements normal.  Ears:  Tympanic membranes  intact.  Nose:  No septal deviation.  Throat is without lesions.  NECK:  Supple.  No thyromegaly.  No carotid bruits.  CHEST:  Clear to auscultation and percussion.  Occasional wheezes heard.  HEART:  A regular sinus rhythm, no murmurs.  ABDOMEN:  Soft, with no hepatosplenomegaly.  Bowel sounds are normal.  EXTREMITIES:  Pulses 2+.  There are changes in her hands as well as facial  joint swelling from her rheumatoid arthritis.  She has no clubbing or edema.  NEUROLOGIC:  She is oriented x3.  Cranial nerves II-XII  are intact.  Sensory and motor intact.  SKIN:  Without lesions.   IMPRESSION:  1.  Right upper lobe lesion, questionable non-small-cell lung cancer.  2.  Rheumatoid arthritis.  3.  Diabetes mellitus type  2.  4.  Hypertension.  5.  Chronic obstructive pulmonary disease.  6.  Osteoporosis.   PLAN:  Right video-assisted thoracoscopic surgery with, right posterior  segment __________, right upper lobectomy.           ______________________________  Ines Bloomer, M.D.     DPB/MEDQ  D:  03/07/2005  T:  03/08/2005  Job:  [id number]

## 2010-07-04 NOTE — Discharge Summary (Signed)
NAMESUMER, MOOREHOUSE                  ACCOUNT NO.:  [id number]   MEDICAL RECORD NO.:  [id number]          PATIENT TYPE:  INP   LOCATION:  2030                         FACILITY:  [facility]   PHYSICIAN:  Ines Bloomer, M.D. DATE OF BIRTH:  [birth year]   DATE OF ADMISSION:  03/09/2005  DATE OF DISCHARGE:  03/20/2005                                 DISCHARGE SUMMARY   ADMISSION DIAGNOSIS:  Right upper lobe lung lesion.   DISCHARGE DIAGNOSIS:  1.  Non-small cell lung carcinoma of the right upper lobe, stage I-A      (T1N0MX).  2.  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  3.  Diabetes mellitus type 2, insulin dependent.  4.  Osteoporosis.  5.  Hypertension.  6.  Rheumatoid arthritis.  7.  History of right hand surgery.  8.  History of right foot surgery x 3.  9.  History of cesarean section.  10. Hypothyroidism.  11. Multiple allergies including Ceclor, Vicodin, sulfa, and Augmentin.  12. Chronic obstructive pulmonary disease.  13. Dyslipidemia.   PROCEDURES:  1.  March 09, 2005, right video assisted thoracoscopic surgery with right      mini-thoracotomy and right upper lobe posterior segmentectomy with node      sampling by Dr. Jovita Gamma.  2.  March 16, 2005, right video assisted thoracoscopic surgery, right      thoracotomy, ligation of thoracic duct by Dr. Jovita Gamma.   BRIEF HISTORY:  Ms. Hattabaugh is a 76 year old Caucasian female with a history of  smoking.  She has been treated for diabetes in the past by Dr. Adrian Prince  and for rheumatoid arthritis by Dr. Lennox Pippins.  In February 2006, she  had pain in her right anterior chest after falling and had several chest x-  rays with one in September showing an impressionable lesion in the right  lower lobe.  Repeat chest x-ray in December showed an aggregate in the right  lower lobe and a CT scan was done which showed an 8 mm nodule in the  posterior segment of the right upper lobe.  PET scan was  done and was found  to have a right upper lobe lesion with an SUV of 4.  She had no immediate  hilar adenopathy.  She denied hemoptysis, fever, chills, or productive  sputum.  She did report occasional wheezes as well as shortness of breath  with exertion.  Pulmonary function tests showed an FEV of 1.88 or 60%  predicted and an FEV1 of 1.42 or 60% of predicted.  She was referred to Dr.  Jovita Gamma for consideration of surgical evaluation for definitive  diagnosis.  Dr. Edwyna Shell felt she should undergo a right video assisted  thoracoscopic surgery with right posterior segmentectomy versus right upper  lobectomy.   HOSPITAL COURSE:  Ms. Glauser was admitted to [facility] on March 09, 2005, and did undergo right posterior segmentectomy.  Pathology  ultimately showed stage I-A non-small cell lung carcinoma, poorly  differentiated adenocarcinoma.  Subsequently, she was seen by Dr. Si Gaul for oncology  consult and was scheduled for follow up on February 5,  although he felt she would not benefit from adjuvant chemotherapy or  radiation at this time, but would follow her closely and discuss treatment  options at her follow up appointment.  From her surgical standpoint, she did  well initially but by postoperative day three, she had increased chest tube  output greater than 200 a shift which was felt to be chyle.  Ultimately, she  was returned to the operating room and did undergo right VATS with right  thoracotomy for exploration and ligation of her thoracic duct.  Subsequently, her chest tube output decreased substantially and with both  chest tubes removed by January 31.  Follow up chest x-ray showed small right  apical pneumothorax with persistent right lung air space disease and left  basilar subsegmental atelectasis.  Aggressive pulmonary toilet was  encouraged as well as nebulization.  Following removal of Ms. Heacox's chest  tube, she was felt stable to transfer from the  step down unit 3300 to  telemetry unit 2000.  At the time of this dictation, she has remained stable  and afebrile with blood pressure 120/68 and heart rate in the 80s.  She has  been weaned from supplemental oxygen saturating 98% on room air.  She has  been advanced to a carbohydrate modified diet and restarted on her home  regimen of NovoLog insulin at 30 units each morning as well as NovoLog  insulin sliding scale with blood sugars ranging around 100 to 240 over the  last 24 hours.  Her pain has been controlled on oral medications although  she initially required morphine PCA postoperatively.  Her right thoracotomy  incision appears to be healing well with mild erythema but does not appear  to be acutely infected.  Her labs have been stable, although her most recent  liver function tests on January 31 show a mildly elevated AST and ALT of 45  and 41 respectively.  Sodium was also mildly increased at 129 down from 135.  Potassium was normal at 3.9, chloride 97, CO2 28, blood glucose 135, BUN 5,  creatinine 0.8, total bilirubin 0.8, alkaline phosphatase 54, total protein  4.5, blood albumin 1.7, calcium 7.6.  White blood cell count normal at 8.7,  hemoglobin 10.6, hematocrit 31.1, platelet count 234.  At the time of this  dictation follow up chest x-ray and labs including liver function tests have  been ordered.  If her liver function tests remain elevated, then it is  anticipated that she will have follow up labs on an outpatient basis with  either Dr. Edwyna Shell or her primary physician.  Otherwise, if there are no  significant changes of her health status, it is anticipated that she will be  discharged home March 20, 2005.   DISCHARGE MEDICATIONS:  1.  Aspirin 81 mg p.o. daily.  2.  Humulin N 30 units subcu each morning.  3.  Zetia 10 mg p.o. daily.  4.  Omeprazole 20 mg daily.  5.  Synthroid 50 mcg daily.  6.  Lipitor 40 mg daily.  7.  Folic acid 1 mg daily. 8.  Calcium and vitamin  D daily.  9.  Levsinex daily per her home regimen.  10. Fosamax 70 mg weekly.  11. She is instructed to hold her Mobic 7.5 mg daily for the next six weeks      or as instructed.  12. She was instructed to continue to hold her Methotrexate 2.5 mg weekly  until further instructed by Dr. Coral Spikes.  13. Ultram 50 mg 1-2 tablets p.o. q.6h. p.r.n. pain.   DISCHARGE INSTRUCTIONS:  She is instructed to avoid driving or heavy lifting  greater than 10 pounds.  She is to continue a diabetic appropriate diet with  routine monitoring of her blood sugars.  She may shower and clean her  incisions gently with soap and water.  She should call if she develops fever  greater than 101, redness or drainage from her incision site, or increasing  shortness of breath.  She is to follow up with Dr. Edwyna Shell at the [facility] office  on  Wednesday, April 01, 2005, at 1:50 p.m. with a chest x-ray at [facility] one hour before her appointment.  She is to follow up with Dr.  Si Gaul at the [facility] on March 23, 2005, at 9:30  a.m.      Jerold Coombe, P.A.    ______________________________  Ines Bloomer, M.D.    AWZ/MEDQ  D:  03/19/2005  T:  03/19/2005  Job:  [id number]   cc:   Lajuana Matte, MD  Fax: [phone]   Demetria Pore. Coral Spikes, M.D.  Fax: [phone]   Tera Mater. Evlyn Kanner, M.D.  Fax: [phone]   Marcene Duos, M.D.  Fax: [phone]

## 2010-07-04 NOTE — Op Note (Signed)
[facility]  Patient:    Marcia [name], Marcia [name] Visit Number: [id number] MRN: [id number]          Service Type: Attending:  Elisha Ponder, M.D. Proc. Date: 01/06/01                             Operative Report  PREOPERATIVE DIAGNOSIS:  Left ring finger A1 pulley tenosynovitis.  POSTOPERATIVE DIAGNOSIS:  Left ring finger A1 pulley tenosynovitis.  PROCEDURE PERFORMED: 1. Local tenosynovectomy left ring finger flexor digitorum profundus    and flexor digitorum sublimis tendons. 2. Left A1 pulley release at the ring finger.  SURGEON:  Elisha Ponder, M.D.  COMPLICATIONS:  None.  ANESTHESIA:  Bier block with IV sedation.  TOURNIQUET TIME:  Less than 30 minutes.  INDICATION:  [name] is a very pleasant 76 year old female who presents with the above mentioned diagnosis.  After counseling regards to risks and benefits of surgery including risk of infection, bleeding, and anesthesia, the patient was prepped and draped for surgery.  Above symptoms demonstrating function.  She desires to proceed.  All questions have been encouraged and answered preoperatively.  FINDINGS:  The patient had tenosynovitic changes about the A1 pulley region. The A1 pulley was released.  Nodularity was noted.  A tenosynovectomy was performed about the tendons without difficulty.  The patient had a good passive range of motion following this with no clicking or popping.  DESCRIPTION OF PROCEDURE:  The patient was seen by myself and anesthesia.  She was taken to the operative suite and underwent light IV sedation and Bier block anesthesia about the left upper extremity, which was then prepped and draped in the usual sterile fashion with Betadine scrub, followed by painting and draping.  Once the sterile field was secured, a longitudinal incision was made overlying the A1 pulley.  Dissection was carried down bluntly to the A1 sheath.  Radial, ulnar, and digital nerve vascular  bundles were retracted and protected at all times.  Following this, the patient underwent incision of the A1 pulley proximally and distally without difficulty.  Care was taken to perform this without trauma to the tendons.  Once this was released without difficulty under 4.0 magnification, a local tenosynovectomy was carried out about the FDP and FDS tendons without difficulty.  The patient did have tenosynovium which was removed.  It was encased around the tendons.  Following this, the tendons glided nicely without difficulty.  The patient then underwent copious irrigation of the wound followed by tourniquet deflation and placement of Marcaine without epinephrine into the wound.  Once this was done, hemostasis was secured.  The tourniquet was deflated and the wound was then closed with interrupted Prolene suture.  The patient had excellent reflow to the hand.  No complications.  No significant bleeding, etc.  She was placed in a sterile compressive dressing.  She will be asked to move her fingers frequently and follow up in seven days.  She was transferred to the recovery room in stable condition.  All sponge and instrument counts were reported as correct.  There were no complications. Attending:  Elisha Ponder, M.D. DD:  01/06/01 TD:  01/08/01 Job: [id number]

## 2010-07-04 NOTE — H&P (Signed)
NAMEUYEN, EICHHOLZ                  ACCOUNT NO.:  [id number]   MEDICAL RECORD NO.:  [id number]          PATIENT TYPE:  INP   LOCATION:  3013                         FACILITY:  [facility]   PHYSICIAN:  Mark A. Perini, M.D.   DATE OF BIRTH:  1934-03-28   DATE OF ADMISSION:  03/21/2006  DATE OF DISCHARGE:                              HISTORY & PHYSICAL   CHIEF COMPLAINT:  Altered mental status.   HISTORY OF PRESENT ILLNESS:  Katessa is a pleasant 76 year old female with  a history of diabetes that is longstanding, who was doing well until 2  or 3 days ago, when she apparently gave herself a large dose of her  short-acting insulin inadvertently.  She immediately drank a lot of Coca-  Cola and went to the emergency room.  There, her sugar was very high.  She was observed for 4-1/2 hours and then went home.  However, the  following day she had intermittent confusion and in fact could not  remember how to administer her insulin or check her own blood sugar.  She returned to the emergency room and was found to have a low-grade  temperature of 100.3, and her blood sugar was 350; however, it was a  very busy emergency room, and she did not wait to be treated and simply  left.  Today, she continued to have some intermittent confusion and  presented to an urgent care.  At the urgent care, she was slightly  confused, although exam was completely nonfocal.  Therefore, she is  directly admitted to the hospital for further evaluation.   PAST MEDICAL HISTORY:  1. Past tobacco use, quit in 2006.  2. Type 2 diabetes since age 30.  She took pills initially but has      been on insulin for many years, insulin only.  3. Osteoporosis.  4. Hypertension.  5. Hypothyroidism.  6. Hyperlipidemia.  7. Rheumatoid arthritis since at least 1989.  8. Chronic obstructive pulmonary disease.  9. Non-small cell lung cancer stage I-A in the right upper lobe,      resected in January 2007.  She states she had a normal CT  scan of      her chest just in the last month.  10.History of c-section, right hand surgery and right foot surgery x3.   ALLERGIES:  TYLOX, CECLOR, VICODIN, SULFA, AUGMENTIN.   MEDICATIONS:  1. Methotrexate 2.5 mg, 8 pills weekly.  2. Folic acid 1 mg daily.  3. Vitamin D 1 pill weekly.  4. NovoLog 2 units before each meal.  5. Lantus 25 units each morning.  6. Zetia 10 mg daily.  7. Fosamax 70 mg each week.  8. Calcium with D twice daily.  9. Synthroid 50 mcg daily.  10.Zocor 40 mg daily.  11.Omeprazole 20 mg daily.  12.Hyoscyamine 0.375 mg as needed.  13.HCTZ 1/2 of a 25-mg tablet daily.  14.Triazolam 1/2 of a 0.25 mg each evening.   SOCIAL HISTORY:  She is married.  She has 3 children.  She is a  [employer] trader.  She quit tobacco in 2006.  No  alcohol or drug use history.   FAMILY HISTORY:  Positive for cancer, tuberculosis and sarcoidosis.   REVIEW OF SYSTEMS:  As per the history of present illness.  She does  have somewhat of a headache, although not severe.  She has no fever now  but did have some low-grade temperatures and feels as though she may  have a cold.  There is no shortness of breath, no nausea or vomiting, no  diarrhea, no chest pains.  At the urgent care, she did not know who the  president was, and again she was unable to give herself her own insulin  at home, which is very unusual for her.  She denies any focal weakness.  She denies any visual changes.  She denies any dysarthria or dysphasia  or dysphagia.  She felt somewhat dizzy yesterday.   PHYSICAL EXAMINATION:  VITAL SIGNS:  Temperature 98.5, pulse 79,  respiratory rate 18, blood pressure 124/62, weight 139 pounds, height 65  inches.  GENERAL:  She is in no acute distress, age appropriate-appearing female.  She seems appropriate and is alert and oriented x4 currently.  HEENT:  She is normocephalic, atraumatic.  Pupils are equally round and  reactive to light.  There is no pallor, no icterus.  EXTREMITIES:   She has normal strength in the upper and lower extremities  bilaterally.  She has 2+ reflexes in the upper and lower extremities  symmetrically, which are symmetric.  LUNGS:  Clear to auscultation bilaterally with no wheezes, rales or  rhonchi.  HEART:  Regular rate and rhythm with no murmur, rub or gallop.  ABDOMEN:  Soft, flat and nontender with no masses or hepatosplenomegaly.  There is no edema.   LABORATORY DATA:  From the urgent care, white count 3.9 with a normal  differential, hemoglobin 12.3, platelet count 112,000.  EKG at the  Urgent Care shows normal sinus rhythm, right atrial enlargement but is  otherwise normal.   ASSESSMENT/PLAN:  Altered mental status, although the patient is not  confused currently.  She does state she has a mild headache.  We will  admit her to a regular bed.  We will control her sugars with Lantus and  sliding scale insulin.  We will check a cranial CT scan to rule out any  acute bleed or major problem, and we will also later check an MRI of the  brain to check for more subtle findings.  We will follow up a urinalysis  and other lab work.  She is a full code status.           ______________________________  Redge Gainer Waynard Edwards, M.D.    MAP/MEDQ  D:  03/21/2006  T:  03/21/2006  Job:  [id number]   cc:   Jeannett Senior A. Evlyn Kanner, M.D.  Demetria Pore. Coral Spikes, M.D.  Maximino Greenland, MD

## 2010-07-04 NOTE — Op Note (Signed)
NAME:  LEMMIE, STEINHAUS                            ACCOUNT NO.:  [id number]   MEDICAL RECORD NO.:  [id number]                   PATIENT TYPE:  AMB   LOCATION:  ENDO                                 FACILITY:  [facility]   PHYSICIAN:  Graylin Shiver, M.D.                DATE OF BIRTH:  [birth year]   DATE OF PROCEDURE:  10/04/2003  DATE OF DISCHARGE:                                 OPERATIVE REPORT   PROCEDURE:  Colonoscopy with biopsy.   INDICATIONS FOR PROCEDURE:  Screening.   Informed consent was obtained after explanation of the risks of bleeding,  infection and perforation.   PREMEDICATION:  Fentanyl 50 mcg IV and Versed 5 mg IV.   DESCRIPTION OF PROCEDURE:  With the patient in the left lateral decubitus  position, a rectal examination was performed.  No masses were felt.  The  Olympus colonoscope was inserted into the rectum and advanced around the  colon to the cecum.  Cecal landmarks were identified.  The cecum and  ascending colon were normal. The transverse colon normal.  The descending  colon and sigmoid were normal. The rectum showed two tiny polyps about 2 to  3 mm in size that were biopsied off with cold forceps.  The patient  tolerated the procedure well without complications.   IMPRESSION:  Two tiny rectal polyps.   PLAN:  The pathology will be checked.  Diagnosis code is 211.4.                                               Graylin Shiver, M.D.    Germain Osgood  D:  10/04/2003  T:  10/04/2003  Job:  [id number]   cc:   Marcene Duos, M.D.  [place]  [place]  [place] [address]  Fax: [phone]

## 2010-07-04 NOTE — Op Note (Signed)
[name], Marcia [name]                  ACCOUNT NO.:  [id number]   MEDICAL RECORD NO.:  [id number]          PATIENT TYPE:  INP   LOCATION:  2899                         FACILITY:  [facility]   PHYSICIAN:  Ines Bloomer, M.D. DATE OF BIRTH:  [birth year]   DATE OF PROCEDURE:  03/09/2005  DATE OF DISCHARGE:                                 OPERATIVE REPORT   PREOPERATIVE DIAGNOSIS:  Right upper lobe mass.   POSTOPERATIVE DIAGNOSIS:  Adenocarcinoma of posterior segment, right upper  lobe.   OPERATION PERFORMED:  Right video assisted thoracoscopic surgery, mini  thoracotomy, right upper lobe posterior segmentectomy with node sampling.   SURGEON:  Ines Bloomer, M.D.   ANESTHESIA:  General.   DESCRIPTION OF PROCEDURE:  After percutaneous insertion of all monitoring  lines, the patient underwent general anesthesia.  She was turned to the  right lateral thoracotomy position.  This patient had moderate to severe  chronic obstructive pulmonary disease and was found to have a right upper  lobe lesion that was 8 mm in size.  Nodes were negative on PET scan.  There  was thought that since the SUV was forward, it might be inflammatory versus  cancer.  So we decided to do a VATS wedge resection of this.  After general  anesthesia and percutaneous insertion of all monitoring lines, the patient  was turned to the right lateral thoracotomy position.  Two trocar sites were  made in the anterior and posterior axillary line at the 8th intercostal  space and at the 6th intercostal space anteriorly.  Two trocars were  inserted and the lesion could be seen in the posterior segment of the right  upper lobe.  A third 4 to 5 cm incision was made over the fifth intercostal  space posteriorly.  The latissimus was just partially divided and the  serrated triangle of auscultation was identified and the 5th intercostal  space was entered.  The lesion was palpated and then grasped with a Duval  lung clamp. Using the  EZ-45 stapler, it was resected with several  applications.  Frozen section revealed adenocarcinoma.  It was decided to do  a posterior segmentectomy of the right upper lobe.  Dissection was started  in the fissure using scope for guidance and the superior posterior branch of  the pulmonary artery to the right upper lobe was identified, doubly clipped  and divided.  Then the transverse branch from the pulmonary vein was divided  with 2-0 silk proximally and distally and divided.  The bronchus was  resected free __________ the EZ-45 stapler, the posterior segment was  resected, dissecting across the lung parenchyma and the bronchus.  CoSeal  was applied to the staple line.  No air leak was obtained.  Biopsy on 11R,  10R and 4R node were dissected free and biopsied or dissected out. Two chest  tubes were placed, a straight chest tube anteriorly and a right angle chest  tube posteriorly and tied in pace with 0 silk.  Marcaine block was done in  the usual fashion.  The On-Q catheter was placed in  the usual fashion.  Chest was closed with two pericostals, #1 Vicryl in the muscle layer, 2-0  Vicryl in the subcutaneous tissue and Dermabond for the skin.  The patient  was then transferred to the recovery room in stable condition.          ______________________________  Ines Bloomer, M.D.    DPB/MEDQ  D:  03/09/2005  T:  03/09/2005  Job:  [id number]

## 2010-07-04 NOTE — Op Note (Signed)
Marcia [name], Marcia [name]                  ACCOUNT NO.:  [id number]   MEDICAL RECORD NO.:  [id number]          PATIENT TYPE:  INP   LOCATION:  3312                         FACILITY:  [facility]   PHYSICIAN:  Ines Bloomer, M.D. DATE OF BIRTH:  [birth year]   DATE OF PROCEDURE:  03/16/2005  DATE OF DISCHARGE:                                 OPERATIVE REPORT   PREOPERATIVE DIAGNOSIS:  Status post right upper lobe posterior  segmentectomy with postoperative chylothorax.   POSTOPERATIVE DIAGNOSIS:  Status post right upper lobe posterior  segmentectomy with postoperative chylothorax.   OPERATION PERFORMED:  Right video assisted thoracoscopic surgery, right  thoracotomy, ligation thoracic duct.   SURGEON:  Ines Bloomer, M.D.   ASSISTANT:  Pecola Leisure, PA   ANESTHESIA:  General.   INDICATIONS FOR PROCEDURE:  This patient one week ago had had a right upper  lobe posterior segmentectomy with mediastinal node dissection and hilar node  dissection for stage I-A nonsmall cell lung cancer.  Second postoperative  day the patient developed what appeared to be chyle coming out of the chest  tubes and this continued at a rate of approximately 600 to 800 mL per day.  Her white count dropped down.  Because of this, she is brought to the  operating room for exploration and ligation of the thoracic duct.   DESCRIPTION OF PROCEDURE:  After general anesthesia and percutaneous  insertion of all monitoring lines, the patient turned to the right lateral  thoracotomy position.  A 30 degree scope was inserted through the median  chest tube site and you could see there were some adhesions of the lung to  the chest wall.  Previous thoracotomy incision was opened and all sutures  were cut and the pericostals were cut which allowed [place] to gain access and  the adhesions were taken down gently from the chest wall.  We then looked  posteriorly and we could see some chyle posteriorly that was along the  posterior  gutter.  This was evacuated.  It went from superiorly up at the  apex down to the diaphragm.  Anteriorly we did not see any chyle. The lung  was stuck to the mediastinum and we took that off with electrocautery.  One  area of the epicardial fat we stapled with No-Knife stapler.  We then  explored the posterior mediastinum and where we had done the node biopsy.  This appeared to be right where the azygous vein went into the superior vena  cava.  This we thought was the area where the lymph was draining.  We saw  some lymph in that area.  The mediastinal area was then oversewn with 2-0  Vicryl and a 3-0 Vicryl was used to ligate what we felt was the duct right  at that area.  Next to the esophagus.  We then went down to the diaphragm  and picked up the duct between the aorta and the esophagus and ligated it at  the diaphragm with 3-0 Vicryl and then about four or five  __________ and  ligated again between the aorta  and the esophagus and then finally right  below the azygous vein, we ligated a third time with 3-0 Vicryl.  We did not  see any further areas of leakage.  The area was irrigated copiously.  CoSeal  was applied again to the staple line and also to the areas of ligation.  Two  chest tubes were placed through the trocar site and tied in place with 0  silk. A Marcaine block was down in the usual fashion.  An On-  Q catheter was also inserted in the usual fashion.  Chest was closed with 4-  0 pericostals of #1 Vicryl in the muscle layer, 2-0 Vicryl in the  subcutaneous tissue and 3-0 Vicryl as a subcuticular stitch.  The patient  was returned to the recovery room in stable condition.           ______________________________  Ines Bloomer, M.D.     DPB/MEDQ  D:  03/16/2005  T:  03/17/2005  Job:  [id number]

## 2010-08-28 ENCOUNTER — Inpatient Hospital Stay (INDEPENDENT_AMBULATORY_CARE_PROVIDER_SITE_OTHER)
Admission: RE | Admit: 2010-08-28 | Discharge: 2010-08-28 | Disposition: A | Discharge status: Home / Self Care | Payer: Medicare Other | Source: Ambulatory Visit | Attending: Family Medicine | Admitting: Family Medicine

## 2010-08-28 DIAGNOSIS — R7989 Other specified abnormal findings of blood chemistry: Secondary | ICD-10-CM

## 2010-08-28 LAB — GLUCOSE, CAPILLARY

## 2010-09-01 ENCOUNTER — Ambulatory Visit (HOSPITAL_COMMUNITY)
Admission: RE | Admit: 2010-09-01 | Discharge: 2010-09-01 | Disposition: A | Discharge status: Home / Self Care | Payer: Medicare Other | Source: Ambulatory Visit | Attending: Internal Medicine | Admitting: Internal Medicine

## 2010-09-01 ENCOUNTER — Encounter (HOSPITAL_BASED_OUTPATIENT_CLINIC_OR_DEPARTMENT_OTHER): Payer: Medicare Other | Admitting: Internal Medicine

## 2010-09-01 ENCOUNTER — Other Ambulatory Visit: Payer: Self-pay | Admitting: Internal Medicine

## 2010-09-01 DIAGNOSIS — I2510 Atherosclerotic heart disease of native coronary artery without angina pectoris: Secondary | ICD-10-CM | POA: Insufficient documentation

## 2010-09-01 DIAGNOSIS — J438 Other emphysema: Secondary | ICD-10-CM | POA: Insufficient documentation

## 2010-09-01 DIAGNOSIS — C3410 Malignant neoplasm of upper lobe, unspecified bronchus or lung: Secondary | ICD-10-CM

## 2010-09-01 DIAGNOSIS — C3490 Malignant neoplasm of unspecified part of unspecified bronchus or lung: Secondary | ICD-10-CM | POA: Insufficient documentation

## 2010-09-01 LAB — CMP (CANCER CENTER ONLY)
Albumin: 3.2 g/dL — ABNORMAL LOW (ref 3.3–5.5)
BUN, Bld: 14 mg/dL (ref 7–22)
CO2: 28 mEq/L (ref 18–33)
Calcium: 9.2 mg/dL (ref 8.0–10.3)
Chloride: 98 mEq/L (ref 98–108)
Glucose, Bld: 247 mg/dL — ABNORMAL HIGH (ref 73–118)
Potassium: 4.6 mEq/L (ref 3.3–4.7)
Total Protein: 6.4 g/dL (ref 6.4–8.1)

## 2010-09-01 LAB — CBC WITH DIFFERENTIAL/PLATELET
Basophils Absolute: 0 10*3/uL (ref 0.0–0.1)
Eosinophils Absolute: 0 10*3/uL (ref 0.0–0.5)
HGB: 12.9 g/dL (ref 11.6–15.9)
MCV: 87.6 fL (ref 79.5–101.0)
MONO#: 0.3 10*3/uL (ref 0.1–0.9)
MONO%: 5.2 % (ref 0.0–14.0)
NEUT#: 5 10*3/uL (ref 1.5–6.5)
RDW: 13.9 % (ref 11.2–14.5)
WBC: 6.2 10*3/uL (ref 3.9–10.3)
lymph#: 0.8 10*3/uL — ABNORMAL LOW (ref 0.9–3.3)

## 2010-09-01 MED ORDER — IOHEXOL 300 MG/ML  SOLN
80.0000 mL | Freq: Once | INTRAMUSCULAR | Status: AC | PRN
  Administered 2010-09-01: 80 mL via INTRAVENOUS

## 2010-09-03 ENCOUNTER — Encounter (HOSPITAL_BASED_OUTPATIENT_CLINIC_OR_DEPARTMENT_OTHER): Payer: Medicare Other | Admitting: Internal Medicine

## 2010-09-03 DIAGNOSIS — Z85118 Personal history of other malignant neoplasm of bronchus and lung: Secondary | ICD-10-CM

## 2010-09-05 ENCOUNTER — Emergency Department (HOSPITAL_COMMUNITY)
Admission: EM | Admit: 2010-09-05 | Discharge: 2010-09-05 | Disposition: A | Discharge status: Home / Self Care | Payer: Medicare Other | Attending: Emergency Medicine | Admitting: Emergency Medicine

## 2010-09-05 DIAGNOSIS — Z85118 Personal history of other malignant neoplasm of bronchus and lung: Secondary | ICD-10-CM | POA: Insufficient documentation

## 2010-09-05 DIAGNOSIS — M069 Rheumatoid arthritis, unspecified: Secondary | ICD-10-CM | POA: Insufficient documentation

## 2010-09-05 DIAGNOSIS — E1169 Type 2 diabetes mellitus with other specified complication: Secondary | ICD-10-CM | POA: Insufficient documentation

## 2010-09-05 LAB — GLUCOSE, CAPILLARY: Glucose-Capillary: 137 mg/dL — ABNORMAL HIGH (ref 70–99)

## 2010-09-09 ENCOUNTER — Ambulatory Visit: Payer: Medicare Other | Admitting: Thoracic Surgery

## 2010-09-11 ENCOUNTER — Other Ambulatory Visit (HOSPITAL_COMMUNITY): Payer: Self-pay | Admitting: Neurology

## 2010-09-11 DIAGNOSIS — F079 Unspecified personality and behavioral disorder due to known physiological condition: Secondary | ICD-10-CM

## 2010-09-11 DIAGNOSIS — E1165 Type 2 diabetes mellitus with hyperglycemia: Secondary | ICD-10-CM

## 2010-09-11 DIAGNOSIS — G319 Degenerative disease of nervous system, unspecified: Secondary | ICD-10-CM

## 2010-09-11 DIAGNOSIS — IMO0002 Reserved for concepts with insufficient information to code with codable children: Secondary | ICD-10-CM

## 2010-09-16 ENCOUNTER — Ambulatory Visit (INDEPENDENT_AMBULATORY_CARE_PROVIDER_SITE_OTHER): Payer: Medicare Other | Admitting: Thoracic Surgery

## 2010-09-16 DIAGNOSIS — C3490 Malignant neoplasm of unspecified part of unspecified bronchus or lung: Secondary | ICD-10-CM

## 2010-09-26 ENCOUNTER — Ambulatory Visit (HOSPITAL_COMMUNITY)
Admission: RE | Admit: 2010-09-26 | Discharge: 2010-09-26 | Disposition: A | Discharge status: Home / Self Care | Payer: Medicare Other | Source: Ambulatory Visit | Attending: Neurology | Admitting: Neurology

## 2010-09-26 DIAGNOSIS — Z4789 Encounter for other orthopedic aftercare: Secondary | ICD-10-CM | POA: Insufficient documentation

## 2010-09-26 DIAGNOSIS — G319 Degenerative disease of nervous system, unspecified: Secondary | ICD-10-CM

## 2010-09-26 DIAGNOSIS — R413 Other amnesia: Secondary | ICD-10-CM | POA: Insufficient documentation

## 2010-09-26 DIAGNOSIS — F079 Unspecified personality and behavioral disorder due to known physiological condition: Secondary | ICD-10-CM

## 2010-09-26 DIAGNOSIS — E1165 Type 2 diabetes mellitus with hyperglycemia: Secondary | ICD-10-CM

## 2010-09-26 DIAGNOSIS — F0789 Other personality and behavioral disorders due to known physiological condition: Secondary | ICD-10-CM | POA: Insufficient documentation

## 2010-09-26 DIAGNOSIS — E118 Type 2 diabetes mellitus with unspecified complications: Secondary | ICD-10-CM | POA: Insufficient documentation

## 2010-09-26 LAB — GLUCOSE, CAPILLARY
Glucose-Capillary: 165 mg/dL — ABNORMAL HIGH (ref 70–99)
Glucose-Capillary: 141 mg/dL — ABNORMAL HIGH (ref 70–99)

## 2010-09-26 LAB — CSF CELL COUNT WITH DIFFERENTIAL
Tube #: 1
Tube #: 4

## 2010-09-26 LAB — GRAM STAIN

## 2010-09-26 LAB — PROTEIN AND GLUCOSE, CSF: Total  Protein, CSF: 42 mg/dL (ref 15–45)

## 2010-09-29 LAB — CSF CULTURE W GRAM STAIN: Culture: NO GROWTH

## 2010-10-16 NOTE — Assessment & Plan Note (Signed)
OFFICE VISIT  [name], Marcia [name] DOB:  03-16-1934                                        September 16, 2010 CHART #:  [id number]  The patient came today, and her CT scan was negative.  She had a cancer in 2007 which was adeno and then in 2009, I resected a right lower lobe superior segmental cancer.  She is doing well with no problems right now.  Her blood pressure is 139/77, pulse 76, respirations 18, sats were 97%.  I will see her back again.  I will let Dr. [name] follow her from now on since it has been 3 years since her last surgeries.  I discussed this with her and her husband, and they agree with this plan.  Ines Bloomer, M.D. Electronically Signed  DPB/MEDQ  D:  09/16/2010  [name]:  09/17/2010  Job:  [id number]

## 2010-10-23 ENCOUNTER — Other Ambulatory Visit (HOSPITAL_COMMUNITY): Payer: Self-pay | Admitting: Endocrinology

## 2010-10-23 DIAGNOSIS — R112 Nausea with vomiting, unspecified: Secondary | ICD-10-CM

## 2010-10-28 ENCOUNTER — Ambulatory Visit (HOSPITAL_COMMUNITY)
Admission: RE | Admit: 2010-10-28 | Discharge: 2010-10-28 | Disposition: A | Discharge status: Home / Self Care | Payer: Medicare Other | Source: Ambulatory Visit | Attending: Endocrinology | Admitting: Endocrinology

## 2010-10-28 DIAGNOSIS — R112 Nausea with vomiting, unspecified: Secondary | ICD-10-CM

## 2010-10-28 MED ORDER — TECHNETIUM TC 99M SULFUR COLLOID
2.0000 | Freq: Once | INTRAVENOUS | Status: AC | PRN
  Administered 2010-10-28: 2 via INTRAVENOUS

## 2010-11-11 LAB — BASIC METABOLIC PANEL
BUN: 16
CO2: 27
Calcium: 7.9 — ABNORMAL LOW
Calcium: 8 — ABNORMAL LOW
Chloride: 99
Creatinine, Ser: 0.82
GFR calc non Af Amer: >60
Glucose, Bld: 184 — ABNORMAL HIGH
Glucose, Bld: 202 — ABNORMAL HIGH
Sodium: 135
Sodium: 136

## 2010-11-11 LAB — COMPREHENSIVE METABOLIC PANEL
ALT: 15
AST: 19
Albumin: 2.2 — ABNORMAL LOW
Albumin: 3.3 — ABNORMAL LOW
Alkaline Phosphatase: 49
Calcium: 9.9
Creatinine, Ser: 0.6
GFR calc Af Amer: >60
GFR calc non Af Amer: >60
Potassium: 4
Sodium: 133 — ABNORMAL LOW
Total Protein: 4.9 — ABNORMAL LOW
Total Protein: 6.9

## 2010-11-11 LAB — CBC
HCT: 27.3 — ABNORMAL LOW
Hemoglobin: 8 — ABNORMAL LOW
Hemoglobin: 9.1 — ABNORMAL LOW
MCHC: 33.2
MCHC: 33.2
MCHC: 34.4
MCV: 88.4
MCV: 89.1
Platelets: 154
Platelets: 199
RDW: 13
RDW: 13.1
RDW: 13.4
RDW: 13.6

## 2010-11-11 LAB — BLOOD GAS, ARTERIAL
Acid-Base Excess: 5.6 — ABNORMAL HIGH
Drawn by: 274481
FIO2: 0.21
O2 Saturation: 98

## 2010-11-11 LAB — URINALYSIS, ROUTINE W REFLEX MICROSCOPIC
Ketones, ur: NEGATIVE
Nitrite: POSITIVE — AB
Specific Gravity, Urine: 1.018
pH: 7.5

## 2010-11-11 LAB — POCT I-STAT 3, ART BLOOD GAS (G3+)
Acid-base deficit: 1
O2 Saturation: 97
Patient temperature: 99.9

## 2010-11-11 LAB — TYPE AND SCREEN: Antibody Screen: NEGATIVE

## 2010-11-11 LAB — URINE MICROSCOPIC-ADD ON

## 2010-11-11 LAB — APTT: aPTT: 21 — ABNORMAL LOW

## 2011-02-11 ENCOUNTER — Other Ambulatory Visit: Payer: Self-pay | Admitting: Internal Medicine

## 2011-02-11 DIAGNOSIS — C3490 Malignant neoplasm of unspecified part of unspecified bronchus or lung: Secondary | ICD-10-CM

## 2011-02-19 ENCOUNTER — Telehealth: Payer: Self-pay | Admitting: Internal Medicine

## 2011-02-19 NOTE — Telephone Encounter (Signed)
S/w the pt and she is aware of the ct scan appt  And lab appt on 03/04/2011 and the md appt on 03/09/2011.

## 2011-03-04 ENCOUNTER — Other Ambulatory Visit (HOSPITAL_BASED_OUTPATIENT_CLINIC_OR_DEPARTMENT_OTHER): Payer: Medicare Other | Admitting: Lab

## 2011-03-04 ENCOUNTER — Ambulatory Visit (HOSPITAL_COMMUNITY)
Admission: RE | Admit: 2011-03-04 | Discharge: 2011-03-04 | Disposition: A | Discharge status: Home / Self Care | Payer: Medicare Other | Source: Ambulatory Visit | Attending: Internal Medicine | Admitting: Internal Medicine

## 2011-03-04 DIAGNOSIS — C3410 Malignant neoplasm of upper lobe, unspecified bronchus or lung: Secondary | ICD-10-CM

## 2011-03-04 DIAGNOSIS — C3490 Malignant neoplasm of unspecified part of unspecified bronchus or lung: Secondary | ICD-10-CM | POA: Diagnosis not present

## 2011-03-04 DIAGNOSIS — Q791 Other congenital malformations of diaphragm: Secondary | ICD-10-CM | POA: Diagnosis not present

## 2011-03-04 DIAGNOSIS — Z85118 Personal history of other malignant neoplasm of bronchus and lung: Secondary | ICD-10-CM | POA: Diagnosis not present

## 2011-03-04 DIAGNOSIS — J984 Other disorders of lung: Secondary | ICD-10-CM | POA: Diagnosis not present

## 2011-03-04 LAB — CBC WITH DIFFERENTIAL/PLATELET
BASO%: 0.2 % (ref 0.0–2.0)
EOS%: 0.6 % (ref 0.0–7.0)
Eosinophils Absolute: 0 10*3/uL (ref 0.0–0.5)
LYMPH%: 14.5 % (ref 14.0–49.7)
MCHC: 33.9 g/dL (ref 31.5–36.0)
MCV: 86.2 fL (ref 79.5–101.0)
MONO%: 5.5 % (ref 0.0–14.0)
NEUT#: 5.2 10*3/uL (ref 1.5–6.5)
RBC: 4.56 10*6/uL (ref 3.70–5.45)
RDW: 14.8 % — ABNORMAL HIGH (ref 11.2–14.5)

## 2011-03-04 LAB — CMP (CANCER CENTER ONLY)
ALT(SGPT): 15 U/L (ref 10–47)
AST: 16 U/L (ref 11–38)
Albumin: 3.3 g/dL (ref 3.3–5.5)
Alkaline Phosphatase: 90 U/L — ABNORMAL HIGH (ref 26–84)
Potassium: 3.5 mEq/L (ref 3.3–4.7)
Sodium: 146 mEq/L — ABNORMAL HIGH (ref 128–145)
Total Bilirubin: 0.5 mg/dl (ref 0.20–1.60)
Total Protein: 6.6 g/dL (ref 6.4–8.1)

## 2011-03-04 MED ORDER — IOHEXOL 300 MG/ML  SOLN
80.0000 mL | Freq: Once | INTRAMUSCULAR | Status: AC | PRN
  Administered 2011-03-04: 80 mL via INTRAVENOUS

## 2011-03-09 ENCOUNTER — Ambulatory Visit (HOSPITAL_BASED_OUTPATIENT_CLINIC_OR_DEPARTMENT_OTHER): Payer: Medicare Other | Admitting: Internal Medicine

## 2011-03-09 VITALS — BP 157/72 | HR 78 | Temp 96.8°F | Wt 142.8 lb

## 2011-03-09 DIAGNOSIS — Z9221 Personal history of antineoplastic chemotherapy: Secondary | ICD-10-CM

## 2011-03-09 DIAGNOSIS — Z85118 Personal history of other malignant neoplasm of bronchus and lung: Secondary | ICD-10-CM | POA: Diagnosis not present

## 2011-03-09 DIAGNOSIS — C3490 Malignant neoplasm of unspecified part of unspecified bronchus or lung: Secondary | ICD-10-CM

## 2011-03-10 NOTE — Progress Notes (Signed)
[facility] OFFICE PROGRESS NOTE  PRINCIPAL DIAGNOSIS:   1. 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 2. History of stage IA (T1, N0, MX) non-small cell lung cancer diagnosed in January 2007.  PRIOR THERAPY:   1. Status post right upper lobe superior segmentectomy as well as lymph node sampling under the care of Dr. Edwyna Shell on March 09, 2005. 2. Status post right lower lobe superior segmentectomy with lymph node dissection under the care of Dr. Edwyna Shell on June 13, 2007 and the pathology revealed mixed small cell and adenocarcinoma. 3. Status post 4 cycles of adjuvant chemotherapy with carboplatin and etoposide.  Last dose was given September 30, 2007.  CURRENT THERAPY:  Observation.  INTERVAL HISTORY: Marcia [name] 77 y.o. female returns to the clinic today for followup visit accompanied by her husband. The patient is doing fine with no specific complaints today. She denied having any significant chest pain or shortness of breath, no cough or hemoptysis. She has no weight loss or night sweats, no nausea or vomiting. She has repeat CT scan of the chest performed recently and she is here today for evaluation and discussion of her scan results.  ALLERGIES:  is allergic to amoxicillin; cefaclor; hydrocodone; and sulfa antibiotics.  MEDICATIONS:  Current Outpatient Prescriptions  Medication Sig Dispense Refill  . alendronate (FOSAMAX) 10 MG tablet Take 10 mg by mouth daily before breakfast. Take with a full glass of water on an empty stomach.      Marland Kitchen aspirin 81 MG tablet Take 160 mg by mouth daily.      . calcium carbonate (OS-CAL) 600 MG TABS Take 600 mg by mouth 2 (two) times daily with a meal.      . ezetimibe (ZETIA) 10 MG tablet Take 10 mg by mouth daily.      . folic acid (FOLVITE) 1 MG tablet Take 1 mg by mouth daily.      Marland Kitchen levothyroxine (SYNTHROID, LEVOTHROID) 50 MCG tablet Take 50 mcg by mouth daily.      . predniSONE (DELTASONE) 5  MG tablet Take 5 mg by mouth daily.        REVIEW OF SYSTEMS:  A comprehensive review of systems was negative.   PHYSICAL EXAMINATION: General appearance: alert, cooperative and no distress Resp: clear to auscultation bilaterally Cardio: regular rate and rhythm, S1, S2 normal, no murmur, click, rub or gallop GI: soft, non-tender; bowel sounds normal; no masses,  no organomegaly Extremities: extremities normal, atraumatic, no cyanosis or edema Neurologic: Alert and oriented X 3, normal strength and tone. Normal symmetric reflexes. Normal coordination and gait  ECOG PERFORMANCE STATUS: 0 - Asymptomatic  Blood pressure 157/72, pulse 78, temperature 96.8 F (36 C), temperature source Oral, weight 142 lb 12.8 oz (64.774 kg).  LABORATORY DATA: Lab Results  Component Value Date   WBC 6.6 03/04/2011   HGB 13.3 03/04/2011   HCT 39.3 03/04/2011   MCV 86.2 03/04/2011   PLT 170 03/04/2011      Chemistry      Component Value Date/Time   NA 146* 03/04/2011 1105   NA 139 09/02/2009 1202   K 3.5 03/04/2011 1105   K 4.2 09/02/2009 1202   CL 105 03/04/2011 1105   CL 103 09/02/2009 1202   CO2 30 03/04/2011 1105   CO2 27 09/02/2009 1202   BUN 19 03/04/2011 1105   BUN 17 09/02/2009 1202   CREATININE 0.7 03/04/2011 1105   CREATININE 0.80 09/02/2009  1202      Component Value Date/Time   CALCIUM 9.0 03/04/2011 1105   CALCIUM 9.3 09/02/2009 1202   ALKPHOS 90* 03/04/2011 1105   ALKPHOS 81 09/02/2009 1202   AST 16 03/04/2011 1105   AST 23 09/02/2009 1202   ALT 23 09/02/2009 1202   BILITOT 0.50 03/04/2011 1105   BILITOT 0.4 09/02/2009 1202       RADIOGRAPHIC STUDIES: Ct Chest W Contrast  03/04/2011  *RADIOLOGY REPORT*  Clinical Data: Lung cancer diagnosed in 2006.  Chemotherapy complete.  CT CHEST WITH CONTRAST  Technique:  Multidetector CT imaging of the chest was performed following the standard protocol during bolus administration of intravenous contrast.  Contrast: 80mL OMNIPAQUE IOHEXOL 300 MG/ML IV SOLN   Comparison: CT thorax 09/01/2010  Findings: No axillary or supraclavicular lymphadenopathy.  No mediastinal or hilar lymphadenopathy.  No pericardial fluid.  Postsurgical change is again demonstrated in the right upper lobe. There is a thin-walled cavitary focus along the surgical margin without increased nodularity or size.  There is pleural parenchymal thickening which is stable compared to prior.  No new nodularity is present.  [place]k  hernia is noted at the right lung base.  The left lung is clear.  IMPRESSION:  1.  No evidence of lung cancer recurrence. 2.  Stable postsurgical change in the right upper lobe.  Original Report Authenticated By: Genevive Bi, M.D.    ASSESSMENT: This is a very pleasant 77 years old white female with recurrent non-small cell lung cancer, currently on observation since August of 2009 with no evidence for disease recurrence. The patient is doing fine. I discussed the scan results with the patient and her husband.  PLAN: I recommended for her continuous observation for now with repeat CT scan of the chest in 6 months and the patient would come back for followup visit at that time. She was advised to call me immediately if she has any concerning symptoms in the interval.  All questions were answered. The patient knows to call the clinic with any problems, questions or concerns. We can certainly see the patient much sooner if necessary.

## 2011-03-19 DIAGNOSIS — Z79899 Other long term (current) drug therapy: Secondary | ICD-10-CM | POA: Diagnosis not present

## 2011-03-19 DIAGNOSIS — M069 Rheumatoid arthritis, unspecified: Secondary | ICD-10-CM | POA: Diagnosis not present

## 2011-03-19 DIAGNOSIS — M810 Age-related osteoporosis without current pathological fracture: Secondary | ICD-10-CM | POA: Diagnosis not present

## 2011-03-19 DIAGNOSIS — M2550 Pain in unspecified joint: Secondary | ICD-10-CM | POA: Diagnosis not present

## 2011-05-04 DIAGNOSIS — E1139 Type 2 diabetes mellitus with other diabetic ophthalmic complication: Secondary | ICD-10-CM | POA: Diagnosis not present

## 2011-05-04 DIAGNOSIS — H43819 Vitreous degeneration, unspecified eye: Secondary | ICD-10-CM | POA: Diagnosis not present

## 2011-05-04 DIAGNOSIS — E11339 Type 2 diabetes mellitus with moderate nonproliferative diabetic retinopathy without macular edema: Secondary | ICD-10-CM | POA: Diagnosis not present

## 2011-06-15 DIAGNOSIS — C3490 Malignant neoplasm of unspecified part of unspecified bronchus or lung: Secondary | ICD-10-CM | POA: Diagnosis not present

## 2011-06-15 DIAGNOSIS — M069 Rheumatoid arthritis, unspecified: Secondary | ICD-10-CM | POA: Diagnosis not present

## 2011-06-15 DIAGNOSIS — M2550 Pain in unspecified joint: Secondary | ICD-10-CM | POA: Diagnosis not present

## 2011-06-15 DIAGNOSIS — Z79899 Other long term (current) drug therapy: Secondary | ICD-10-CM | POA: Diagnosis not present

## 2011-09-01 ENCOUNTER — Emergency Department (HOSPITAL_COMMUNITY)
Admission: EM | Admit: 2011-09-01 | Discharge: 2011-09-01 | Disposition: A | Discharge status: Home / Self Care | Payer: Medicare Other | Attending: Emergency Medicine | Admitting: Emergency Medicine

## 2011-09-01 ENCOUNTER — Encounter (HOSPITAL_COMMUNITY): Payer: Self-pay | Admitting: *Deleted

## 2011-09-01 DIAGNOSIS — R209 Unspecified disturbances of skin sensation: Secondary | ICD-10-CM | POA: Diagnosis not present

## 2011-09-01 DIAGNOSIS — E1169 Type 2 diabetes mellitus with other specified complication: Secondary | ICD-10-CM | POA: Insufficient documentation

## 2011-09-01 DIAGNOSIS — M7989 Other specified soft tissue disorders: Secondary | ICD-10-CM | POA: Diagnosis not present

## 2011-09-01 DIAGNOSIS — R739 Hyperglycemia, unspecified: Secondary | ICD-10-CM

## 2011-09-01 DIAGNOSIS — R609 Edema, unspecified: Secondary | ICD-10-CM | POA: Insufficient documentation

## 2011-09-01 DIAGNOSIS — Z7982 Long term (current) use of aspirin: Secondary | ICD-10-CM | POA: Diagnosis not present

## 2011-09-01 DIAGNOSIS — E119 Type 2 diabetes mellitus without complications: Secondary | ICD-10-CM | POA: Diagnosis not present

## 2011-09-01 DIAGNOSIS — Z859 Personal history of malignant neoplasm, unspecified: Secondary | ICD-10-CM | POA: Insufficient documentation

## 2011-09-01 DIAGNOSIS — R600 Localized edema: Secondary | ICD-10-CM

## 2011-09-01 DIAGNOSIS — Z79899 Other long term (current) drug therapy: Secondary | ICD-10-CM | POA: Insufficient documentation

## 2011-09-01 DIAGNOSIS — M069 Rheumatoid arthritis, unspecified: Secondary | ICD-10-CM | POA: Insufficient documentation

## 2011-09-01 HISTORY — DX: Malignant (primary) neoplasm, unspecified: C80.1

## 2011-09-01 HISTORY — DX: Rheumatoid arthritis, unspecified: M06.9

## 2011-09-01 LAB — GLUCOSE, CAPILLARY
Glucose-Capillary: 327 mg/dL — ABNORMAL HIGH (ref 70–99)
Glucose-Capillary: 162 mg/dL — ABNORMAL HIGH (ref 70–99)
Glucose-Capillary: 77 mg/dL (ref 70–99)

## 2011-09-01 LAB — COMPREHENSIVE METABOLIC PANEL
ALT: 8 U/L (ref 0–35)
Albumin: 3.1 g/dL — ABNORMAL LOW (ref 3.5–5.2)
Alkaline Phosphatase: 123 U/L — ABNORMAL HIGH (ref 39–117)
Calcium: 9.4 mg/dL (ref 8.4–10.5)
Potassium: 3.5 mEq/L (ref 3.5–5.1)
Sodium: 134 mEq/L — ABNORMAL LOW (ref 135–145)
Total Protein: 6.5 g/dL (ref 6.0–8.3)

## 2011-09-01 LAB — URINALYSIS, ROUTINE W REFLEX MICROSCOPIC
Bilirubin Urine: NEGATIVE
Glucose, UA: >1000 mg/dL — AB
Ketones, ur: NEGATIVE mg/dL
Leukocytes, UA: NEGATIVE
Nitrite: NEGATIVE
Specific Gravity, Urine: 1.025 (ref 1.005–1.030)
pH: 6 (ref 5.0–8.0)

## 2011-09-01 LAB — CBC WITH DIFFERENTIAL/PLATELET
Basophils Absolute: 0 10*3/uL (ref 0.0–0.1)
Basophils Relative: 1 % (ref 0–1)
Eosinophils Absolute: 0.1 10*3/uL (ref 0.0–0.7)
Eosinophils Relative: 2 % (ref 0–5)
MCH: 29 pg (ref 26.0–34.0)
MCHC: 34.5 g/dL (ref 30.0–36.0)
Neutrophils Relative %: 70 % (ref 43–77)
Platelets: 174 10*3/uL (ref 150–400)
RBC: 4.76 MIL/uL (ref 3.87–5.11)
RDW: 13.2 % (ref 11.5–15.5)

## 2011-09-01 LAB — URINE MICROSCOPIC-ADD ON

## 2011-09-01 MED ORDER — SODIUM CHLORIDE 0.9 % IV SOLN
Freq: Once | INTRAVENOUS | Status: AC
  Administered 2011-09-01: 18:00:00 via INTRAVENOUS

## 2011-09-01 MED ORDER — INSULIN ASPART 100 UNIT/ML ~~LOC~~ SOLN
10.0000 [IU] | Freq: Once | SUBCUTANEOUS | Status: AC
  Administered 2011-09-01: 10 [IU] via INTRAVENOUS
  Filled 2011-09-01: qty 1

## 2011-09-01 NOTE — ED Provider Notes (Signed)
History     CSN: [id number]  Arrival date & time 09/01/11  1702   First MD Initiated Contact with Patient 09/01/11 1729      Chief Complaint  Patient presents with  . Hyperglycemia    (Consider location/radiation/quality/duration/timing/severity/associated sxs/prior treatment) HPI Comments: Also states her blood sugar has been elevated today up to 350 without improvement with lantus.  Pt states she has been on her diabetic diet and denies other sx except for the leg pain and swelling in both feet and lower legs.  Patient is a 77 y.o. female presenting with leg pain. The history is provided by the patient and the spouse.  Leg Pain  The incident occurred more than 1 week ago. The incident occurred at home (since she has been home from the beach for a week slowly worsening leg swelling). There was no injury mechanism. Pain location: bilateral legs up to the mid shin. The quality of the pain is described as aching. The pain is at a severity of 3/10. The pain is mild. The pain has been constant since onset. Associated symptoms include tingling. Pertinent negatives include no inability to bear weight. Nothing aggravates the symptoms. She has tried nothing for the symptoms. The treatment provided no relief.    Past Medical History  Diagnosis Date  . Diabetes mellitus   . Rheumatoid arthritis   . Cancer     Past Surgical History  Procedure Date  . Cesarean section   . Lung surgery     No family history on file.  History  Substance Use Topics  . Smoking status: Never Smoker   . Smokeless tobacco: Not on file  . Alcohol Use: No    OB History    Grav Para Term Preterm Abortions TAB SAB Ect Mult Living                  Review of Systems  Constitutional: Negative for fever.  Respiratory: Negative for cough and shortness of breath.   Cardiovascular: Positive for leg swelling. Negative for chest pain.  Genitourinary: Negative for dysuria.  Neurological: Positive for tingling.    All other systems reviewed and are negative.    Allergies  Amoxicillin; Cefaclor; Hydrocodone; and Sulfa antibiotics  Home Medications   Current Outpatient Rx  Name Route Sig Dispense Refill  . ASPIRIN 81 MG PO TABS Oral Take 81 mg by mouth daily.     Marland Kitchen CALCIUM CARBONATE 600 MG PO TABS Oral Take 600 mg by mouth 2 (two) times daily with a meal.    . DONEPEZIL HCL 10 MG PO TABS Oral Take 10 mg by mouth at bedtime.    Marland Kitchen EZETIMIBE 10 MG PO TABS Oral Take 10 mg by mouth daily.    Marland Kitchen HYDROXYCHLOROQUINE SULFATE 200 MG PO TABS Oral Take 200 mg by mouth 2 (two) times daily.    Marland Kitchen OMEPRAZOLE 20 MG PO CPDR Oral Take 20 mg by mouth daily.      BP 143/46  Pulse 79  Temp 97.4 F (36.3 C) (Oral)  Resp 16  SpO2 96%  Physical Exam  Nursing note and vitals reviewed. Constitutional: She is oriented to person, place, and time. She appears well-developed and well-nourished. No distress.  HENT:  Head: Normocephalic and atraumatic.  Mouth/Throat: Oropharynx is clear and moist.  Eyes: Conjunctivae and EOM are normal. Pupils are equal, round, and reactive to light.  Neck: Normal range of motion. Neck supple.  Cardiovascular: Normal rate, regular rhythm and intact distal pulses.  No murmur heard. Pulmonary/Chest: Effort normal and breath sounds normal. No respiratory distress. She has no wheezes. She has no rales.  Abdominal: Soft. She exhibits no distension. There is no tenderness. There is no rebound and no guarding.  Musculoskeletal: Normal range of motion. She exhibits edema. She exhibits no tenderness.       2+ pitting edema in the bilateral lower extremities  Neurological: She is alert and oriented to person, place, and time.  Skin: Skin is warm and dry. No rash noted. No erythema.  Psychiatric: She has a normal mood and affect. Her behavior is normal.    ED Course  Procedures (including critical care time)  Labs Reviewed  GLUCOSE, CAPILLARY - Abnormal; Notable for the following:     Glucose-Capillary 327 (*)     All other components within normal limits  COMPREHENSIVE METABOLIC PANEL - Abnormal; Notable for the following:    Sodium 134 (*)     Glucose, Bld 325 (*)     Albumin 3.1 (*)     Alkaline Phosphatase 123 (*)     Total Bilirubin 0.2 (*)     GFR calc non Af Amer 87 (*)     All other components within normal limits  URINALYSIS, ROUTINE W REFLEX MICROSCOPIC - Abnormal; Notable for the following:    Glucose, UA >1000 (*)     All other components within normal limits  PRO B NATRIURETIC PEPTIDE - Abnormal; Notable for the following:    Pro B Natriuretic peptide (BNP) 285.4 (*)     All other components within normal limits  URINE MICROSCOPIC-ADD ON - Abnormal; Notable for the following:    Squamous Epithelial / LPF FEW (*)     Bacteria, UA FEW (*)     All other components within normal limits  GLUCOSE, CAPILLARY - Abnormal; Notable for the following:    Glucose-Capillary 162 (*)     All other components within normal limits  CBC WITH DIFFERENTIAL   No results found.   No diagnosis found.    MDM   Patient with elevated blood sugar today despite taking Lantus. She also complains of bilateral lower chimney edema for the last weeks and she's returned from the beach. She has 2+ pitting edema up to mid shin on both feet without signs of cellulitis. Low concern for DVT and feel most likely is due to her travel and venous insufficiency. CBC, CMP, UA to check for infection or other reasons for hyperglycemia revealed few bacteria on her urine with 3-6 white blood cells but otherwise within normal limits. Blood sugar 325 and a normal creatinine.  BNP wnl.   Pt treated with 10units of insulin.   7:34 PM Pt with normal labs and do not feel UTI at this time.  No signs of CHF.  Will d/c home and she has f/u with Dr. Evlyn Kanner on Thursday.     Gwyneth Sprout, MD 09/01/11 [phone]

## 2011-09-01 NOTE — ED Notes (Signed)
MD at bedside.  EDP Plunkett at bs to evaluate this pt

## 2011-09-01 NOTE — ED Notes (Signed)
CBG 162  RN acknowledge

## 2011-09-01 NOTE — ED Notes (Signed)
Pt denies N/V/d and fever.  Pt could not recall checking sugar this am- Pt c/o of aching to legs for 1 week.  Pt took 30 units of Lantus taken at 3pm

## 2011-09-01 NOTE — ED Notes (Signed)
Pt reports hyperglycemic since today. No nausea/vomiting/diarrhea. Chronic leg pain, no other pain. BG at home 369- took 30 units of lantus at 1500, husband states sugar still elevated.

## 2011-09-01 NOTE — ED Notes (Signed)
Mr. Sloan requesting to speak with EDP Plunkett privaetly.  Family and EDP speaking in conference room

## 2011-09-01 NOTE — ED Notes (Signed)
Insulin verified by [place] RN

## 2011-09-03 DIAGNOSIS — M25569 Pain in unspecified knee: Secondary | ICD-10-CM | POA: Diagnosis not present

## 2011-09-03 DIAGNOSIS — E785 Hyperlipidemia, unspecified: Secondary | ICD-10-CM | POA: Diagnosis not present

## 2011-09-03 DIAGNOSIS — M949 Disorder of cartilage, unspecified: Secondary | ICD-10-CM | POA: Diagnosis not present

## 2011-09-03 DIAGNOSIS — M899 Disorder of bone, unspecified: Secondary | ICD-10-CM | POA: Diagnosis not present

## 2011-09-03 DIAGNOSIS — E7800 Pure hypercholesterolemia, unspecified: Secondary | ICD-10-CM | POA: Diagnosis not present

## 2011-09-03 DIAGNOSIS — E1049 Type 1 diabetes mellitus with other diabetic neurological complication: Secondary | ICD-10-CM | POA: Diagnosis not present

## 2011-09-03 DIAGNOSIS — E119 Type 2 diabetes mellitus without complications: Secondary | ICD-10-CM | POA: Diagnosis not present

## 2011-09-03 DIAGNOSIS — E538 Deficiency of other specified B group vitamins: Secondary | ICD-10-CM | POA: Diagnosis not present

## 2011-09-03 DIAGNOSIS — E049 Nontoxic goiter, unspecified: Secondary | ICD-10-CM | POA: Diagnosis not present

## 2011-09-03 DIAGNOSIS — E1065 Type 1 diabetes mellitus with hyperglycemia: Secondary | ICD-10-CM | POA: Diagnosis not present

## 2011-09-03 DIAGNOSIS — R609 Edema, unspecified: Secondary | ICD-10-CM | POA: Diagnosis not present

## 2011-09-03 DIAGNOSIS — R413 Other amnesia: Secondary | ICD-10-CM | POA: Diagnosis not present

## 2011-09-03 DIAGNOSIS — Z23 Encounter for immunization: Secondary | ICD-10-CM | POA: Diagnosis not present

## 2011-09-03 DIAGNOSIS — E039 Hypothyroidism, unspecified: Secondary | ICD-10-CM | POA: Diagnosis not present

## 2011-09-03 LAB — URINE CULTURE

## 2011-09-04 ENCOUNTER — Ambulatory Visit (HOSPITAL_COMMUNITY)
Admission: RE | Admit: 2011-09-04 | Discharge: 2011-09-04 | Disposition: A | Discharge status: Home / Self Care | Payer: Medicare Other | Source: Ambulatory Visit | Attending: Internal Medicine | Admitting: Internal Medicine

## 2011-09-04 ENCOUNTER — Other Ambulatory Visit (HOSPITAL_BASED_OUTPATIENT_CLINIC_OR_DEPARTMENT_OTHER): Payer: Medicare Other | Admitting: Lab

## 2011-09-04 DIAGNOSIS — C3490 Malignant neoplasm of unspecified part of unspecified bronchus or lung: Secondary | ICD-10-CM

## 2011-09-04 DIAGNOSIS — J984 Other disorders of lung: Secondary | ICD-10-CM | POA: Diagnosis not present

## 2011-09-04 DIAGNOSIS — J438 Other emphysema: Secondary | ICD-10-CM | POA: Insufficient documentation

## 2011-09-04 DIAGNOSIS — Z4789 Encounter for other orthopedic aftercare: Secondary | ICD-10-CM | POA: Insufficient documentation

## 2011-09-04 LAB — CMP (CANCER CENTER ONLY)
ALT(SGPT): 23 U/L (ref 10–47)
AST: 21 U/L (ref 11–38)
Albumin: 3.1 g/dL — ABNORMAL LOW (ref 3.3–5.5)
CO2: 28 mEq/L (ref 18–33)
Calcium: 9.1 mg/dL (ref 8.0–10.3)
Chloride: 99 mEq/L (ref 98–108)
Creat: 0.7 mg/dl (ref 0.6–1.2)
Potassium: 4.4 mEq/L (ref 3.3–4.7)
Sodium: 141 mEq/L (ref 128–145)
Total Protein: 6.4 g/dL (ref 6.4–8.1)

## 2011-09-04 LAB — CBC WITH DIFFERENTIAL/PLATELET
BASO%: 0.4 % (ref 0.0–2.0)
EOS%: 1.3 % (ref 0.0–7.0)
HCT: 37.9 % (ref 34.8–46.6)
MCH: 29.3 pg (ref 25.1–34.0)
MCHC: 33.7 g/dL (ref 31.5–36.0)
MONO#: 0.5 10*3/uL (ref 0.1–0.9)
RBC: 4.35 10*6/uL (ref 3.70–5.45)
RDW: 14.1 % (ref 11.2–14.5)
WBC: 7.2 10*3/uL (ref 3.9–10.3)
lymph#: 0.8 10*3/uL — ABNORMAL LOW (ref 0.9–3.3)

## 2011-09-04 MED ORDER — IOHEXOL 300 MG/ML  SOLN
80.0000 mL | Freq: Once | INTRAMUSCULAR | Status: AC | PRN
  Administered 2011-09-04: 80 mL via INTRAVENOUS

## 2011-09-07 ENCOUNTER — Telehealth: Payer: Self-pay | Admitting: Medical Oncology

## 2011-09-07 ENCOUNTER — Telehealth: Payer: Self-pay | Admitting: *Deleted

## 2011-09-07 ENCOUNTER — Ambulatory Visit: Payer: Medicare Other | Admitting: Internal Medicine

## 2011-09-07 NOTE — Telephone Encounter (Signed)
Per voicemail from the patient's husband, patient is sick and needs to cancel appt for today. They want to reschedule, I have forwarded the call to the desk RN. JMW

## 2011-09-07 NOTE — Telephone Encounter (Signed)
Pt sick today . R/s appointment from today for tomorrow. Schedule request sent

## 2011-09-10 ENCOUNTER — Telehealth: Payer: Self-pay | Admitting: Internal Medicine

## 2011-09-10 NOTE — Telephone Encounter (Signed)
Returned call re r/s appt. S/w pt and she would like to have her husband call back to take care of this.

## 2011-09-18 DIAGNOSIS — M810 Age-related osteoporosis without current pathological fracture: Secondary | ICD-10-CM | POA: Diagnosis not present

## 2011-09-18 DIAGNOSIS — F0390 Unspecified dementia without behavioral disturbance: Secondary | ICD-10-CM | POA: Diagnosis not present

## 2011-09-18 DIAGNOSIS — M069 Rheumatoid arthritis, unspecified: Secondary | ICD-10-CM | POA: Diagnosis not present

## 2011-09-18 DIAGNOSIS — M2550 Pain in unspecified joint: Secondary | ICD-10-CM | POA: Diagnosis not present

## 2011-09-18 DIAGNOSIS — Z79899 Other long term (current) drug therapy: Secondary | ICD-10-CM | POA: Diagnosis not present

## 2011-09-21 ENCOUNTER — Telehealth: Payer: Self-pay | Admitting: Internal Medicine

## 2011-09-21 NOTE — Telephone Encounter (Signed)
lester called to r/s appt,done °

## 2011-09-22 ENCOUNTER — Ambulatory Visit: Payer: Medicare Other | Admitting: Internal Medicine

## 2011-10-05 ENCOUNTER — Telehealth: Payer: Self-pay | Admitting: Internal Medicine

## 2011-10-05 NOTE — Telephone Encounter (Signed)
spouse called to r/s appt,called him back and r/s to 8/22

## 2011-10-08 ENCOUNTER — Ambulatory Visit (HOSPITAL_BASED_OUTPATIENT_CLINIC_OR_DEPARTMENT_OTHER): Payer: Medicare Other | Admitting: Internal Medicine

## 2011-10-08 ENCOUNTER — Telehealth: Payer: Self-pay | Admitting: Internal Medicine

## 2011-10-08 VITALS — BP 141/64 | HR 77 | Temp 98.2°F | Resp 18 | Ht 64.0 in | Wt 133.2 lb

## 2011-10-08 DIAGNOSIS — C3430 Malignant neoplasm of lower lobe, unspecified bronchus or lung: Secondary | ICD-10-CM | POA: Diagnosis not present

## 2011-10-08 DIAGNOSIS — C3491 Malignant neoplasm of unspecified part of right bronchus or lung: Secondary | ICD-10-CM | POA: Insufficient documentation

## 2011-10-08 DIAGNOSIS — C3410 Malignant neoplasm of upper lobe, unspecified bronchus or lung: Secondary | ICD-10-CM | POA: Diagnosis not present

## 2011-10-08 DIAGNOSIS — C3490 Malignant neoplasm of unspecified part of unspecified bronchus or lung: Secondary | ICD-10-CM

## 2011-10-08 NOTE — Progress Notes (Signed)
[facility] [phone]   Fax:([phone]  OFFICE PROGRESS NOTE  PRINCIPAL DIAGNOSIS:  1. 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 2. History of stage IA (T1, N0, MX) non-small cell lung cancer diagnosed in January 2007.  PRIOR THERAPY:  1. Status post right upper lobe superior segmentectomy as well as lymph node sampling under the care of Dr. Edwyna Shell on March 09, 2005. 2. Status post right lower lobe superior segmentectomy with lymph node dissection under the care of Dr. Edwyna Shell on June 13, 2007 and the pathology revealed mixed small cell and adenocarcinoma. 3. Status post 4 cycles of adjuvant chemotherapy with carboplatin and etoposide. Last dose was given September 30, 2007.  CURRENT THERAPY: Observation.  INTERVAL HISTORY: Marcia [name] 77 y.o. female returns to the clinic today for followup visit accompanied by her husband. The patient is feeling fine today with no specific complaints. She denied having any significant chest pain, shortness breath, cough or hemoptysis. She denied having any significant weight loss or night sweats. The patient has repeat CT scan of the chest performed recently and she is here today for evaluation and discussion of her scan results.  MEDICAL HISTORY: Past Medical History  Diagnosis Date  . Diabetes mellitus   . Rheumatoid arthritis   . Cancer     ALLERGIES:  is allergic to amoxicillin; cefaclor; hydrocodone; and sulfa antibiotics.  MEDICATIONS:  Current Outpatient Prescriptions  Medication Sig Dispense Refill  . aspirin 81 MG tablet Take 81 mg by mouth daily.       . calcium carbonate (OS-CAL) 600 MG TABS Take 600 mg by mouth 2 (two) times daily with a meal.      . donepezil (ARICEPT) 10 MG tablet Take 10 mg by mouth at bedtime.      Marland Kitchen ezetimibe (ZETIA) 10 MG tablet Take 10 mg by mouth daily.      . hydroxychloroquine (PLAQUENIL) 200 MG tablet Take 200 mg by mouth 2  (two) times daily.      Marland Kitchen omeprazole (PRILOSEC) 20 MG capsule Take 20 mg by mouth daily.        SURGICAL HISTORY:  Past Surgical History  Procedure Date  . Cesarean section   . Lung surgery     REVIEW OF SYSTEMS:  A comprehensive review of systems was negative.   PHYSICAL EXAMINATION: General appearance: alert, cooperative and no distress Head: Normocephalic, without obvious abnormality, atraumatic Neck: no adenopathy Resp: clear to auscultation bilaterally Back: negative, symmetric, no curvature. ROM normal. No CVA tenderness. Cardio: regular rate and rhythm, S1, S2 normal, no murmur, click, rub or gallop GI: soft, non-tender; bowel sounds normal; no masses,  no organomegaly Extremities: extremities normal, atraumatic, no cyanosis or edema Neurologic: Alert and oriented X 3, normal strength and tone. Normal symmetric reflexes. Normal coordination and gait  ECOG PERFORMANCE STATUS: 1 - Symptomatic but completely ambulatory  Blood pressure 141/64, pulse 77, temperature 98.2 F (36.8 C), temperature source Oral, resp. rate 18, height 5\' 4"  (1.626 m), weight 133 lb 3.2 oz (60.419 kg).  LABORATORY DATA: Lab Results  Component Value Date   WBC 7.2 09/04/2011   HGB 12.7 09/04/2011   HCT 37.9 09/04/2011   MCV 87.0 09/04/2011   PLT 164 09/04/2011      Chemistry      Component Value Date/Time   NA 141 09/04/2011 1011   NA 134* 09/01/2011 1758   K 4.4 09/04/2011 1011  K 3.5 09/01/2011 1758   CL 99 09/04/2011 1011   CL 98 09/01/2011 1758   CO2 28 09/04/2011 1011   CO2 27 09/01/2011 1758   BUN 17 09/04/2011 1011   BUN 12 09/01/2011 1758   CREATININE 0.7 09/04/2011 1011   CREATININE 0.62 09/01/2011 1758      Component Value Date/Time   CALCIUM 9.1 09/04/2011 1011   CALCIUM 9.4 09/01/2011 1758   ALKPHOS 140* 09/04/2011 1011   ALKPHOS 123* 09/01/2011 1758   AST 21 09/04/2011 1011   AST 11 09/01/2011 1758   ALT 8 09/01/2011 1758   BILITOT 0.50 09/04/2011 1011   BILITOT 0.2* 09/01/2011 1758         RADIOGRAPHIC STUDIES: No results found.  ASSESSMENT: This is a very pleasant 77 years old white female with history of mixed small cell as well as adenocarcinoma of the lung status post surgical resection and adjuvant treatment. She has been observation since August of 2009 with no evidence for disease recurrence.   PLAN: I discussed the scan results with the patient and her husband. I recommended for her to continue on observation for now with repeat CT scan of the chest in 6 months. The patient would come back for followup visit at that time. She was advised to call me immediately if she has any concerning symptoms in the interval.  All questions were answered. The patient knows to call the clinic with any problems, questions or concerns. We can certainly see the patient much sooner if necessary.

## 2011-10-08 NOTE — Patient Instructions (Signed)
Followup in 6 months with repeat CT scan of the chest. 

## 2011-10-08 NOTE — Telephone Encounter (Signed)
gve the pt her  Feb 2014 appt calendar along with the ct scan appt

## 2011-11-03 DIAGNOSIS — E11339 Type 2 diabetes mellitus with moderate nonproliferative diabetic retinopathy without macular edema: Secondary | ICD-10-CM | POA: Diagnosis not present

## 2011-11-03 DIAGNOSIS — H35049 Retinal micro-aneurysms, unspecified, unspecified eye: Secondary | ICD-10-CM | POA: Diagnosis not present

## 2011-11-03 DIAGNOSIS — E1139 Type 2 diabetes mellitus with other diabetic ophthalmic complication: Secondary | ICD-10-CM | POA: Diagnosis not present

## 2011-11-24 DIAGNOSIS — Z23 Encounter for immunization: Secondary | ICD-10-CM | POA: Diagnosis not present

## 2011-12-03 ENCOUNTER — Other Ambulatory Visit: Payer: Self-pay | Admitting: Dermatology

## 2011-12-03 DIAGNOSIS — D044 Carcinoma in situ of skin of scalp and neck: Secondary | ICD-10-CM | POA: Diagnosis not present

## 2011-12-03 DIAGNOSIS — D485 Neoplasm of uncertain behavior of skin: Secondary | ICD-10-CM | POA: Diagnosis not present

## 2011-12-03 DIAGNOSIS — Z85828 Personal history of other malignant neoplasm of skin: Secondary | ICD-10-CM | POA: Diagnosis not present

## 2011-12-03 DIAGNOSIS — L821 Other seborrheic keratosis: Secondary | ICD-10-CM | POA: Diagnosis not present

## 2011-12-03 DIAGNOSIS — L570 Actinic keratosis: Secondary | ICD-10-CM | POA: Diagnosis not present

## 2011-12-03 DIAGNOSIS — D049 Carcinoma in situ of skin, unspecified: Secondary | ICD-10-CM | POA: Diagnosis not present

## 2011-12-07 DIAGNOSIS — R0989 Other specified symptoms and signs involving the circulatory and respiratory systems: Secondary | ICD-10-CM | POA: Diagnosis not present

## 2011-12-07 DIAGNOSIS — E538 Deficiency of other specified B group vitamins: Secondary | ICD-10-CM | POA: Diagnosis not present

## 2011-12-07 DIAGNOSIS — F068 Other specified mental disorders due to known physiological condition: Secondary | ICD-10-CM | POA: Diagnosis not present

## 2011-12-07 DIAGNOSIS — E1065 Type 1 diabetes mellitus with hyperglycemia: Secondary | ICD-10-CM | POA: Diagnosis not present

## 2011-12-07 DIAGNOSIS — E1049 Type 1 diabetes mellitus with other diabetic neurological complication: Secondary | ICD-10-CM | POA: Diagnosis not present

## 2011-12-18 DIAGNOSIS — R9409 Abnormal results of other function studies of central nervous system: Secondary | ICD-10-CM | POA: Diagnosis not present

## 2011-12-18 DIAGNOSIS — G319 Degenerative disease of nervous system, unspecified: Secondary | ICD-10-CM | POA: Diagnosis not present

## 2011-12-18 DIAGNOSIS — E1169 Type 2 diabetes mellitus with other specified complication: Secondary | ICD-10-CM | POA: Diagnosis not present

## 2011-12-18 DIAGNOSIS — F079 Unspecified personality and behavioral disorder due to known physiological condition: Secondary | ICD-10-CM | POA: Diagnosis not present

## 2011-12-18 DIAGNOSIS — E1165 Type 2 diabetes mellitus with hyperglycemia: Secondary | ICD-10-CM | POA: Diagnosis not present

## 2012-02-02 DIAGNOSIS — L259 Unspecified contact dermatitis, unspecified cause: Secondary | ICD-10-CM | POA: Diagnosis not present

## 2012-03-15 DIAGNOSIS — L821 Other seborrheic keratosis: Secondary | ICD-10-CM | POA: Diagnosis not present

## 2012-03-15 DIAGNOSIS — L570 Actinic keratosis: Secondary | ICD-10-CM | POA: Diagnosis not present

## 2012-03-21 DIAGNOSIS — M810 Age-related osteoporosis without current pathological fracture: Secondary | ICD-10-CM | POA: Diagnosis not present

## 2012-03-21 DIAGNOSIS — M069 Rheumatoid arthritis, unspecified: Secondary | ICD-10-CM | POA: Diagnosis not present

## 2012-03-21 DIAGNOSIS — M2550 Pain in unspecified joint: Secondary | ICD-10-CM | POA: Diagnosis not present

## 2012-03-21 DIAGNOSIS — Z79899 Other long term (current) drug therapy: Secondary | ICD-10-CM | POA: Diagnosis not present

## 2012-04-04 ENCOUNTER — Ambulatory Visit (HOSPITAL_COMMUNITY): Payer: Medicare Other

## 2012-04-04 ENCOUNTER — Encounter (HOSPITAL_COMMUNITY): Payer: Self-pay

## 2012-04-04 ENCOUNTER — Other Ambulatory Visit (HOSPITAL_BASED_OUTPATIENT_CLINIC_OR_DEPARTMENT_OTHER): Payer: Medicare Other | Admitting: Lab

## 2012-04-04 ENCOUNTER — Ambulatory Visit (HOSPITAL_COMMUNITY)
Admission: RE | Admit: 2012-04-04 | Discharge: 2012-04-04 | Disposition: A | Discharge status: Home / Self Care | Payer: Medicare Other | Source: Ambulatory Visit | Attending: Internal Medicine | Admitting: Internal Medicine

## 2012-04-04 DIAGNOSIS — I2510 Atherosclerotic heart disease of native coronary artery without angina pectoris: Secondary | ICD-10-CM | POA: Insufficient documentation

## 2012-04-04 DIAGNOSIS — C3410 Malignant neoplasm of upper lobe, unspecified bronchus or lung: Secondary | ICD-10-CM | POA: Diagnosis not present

## 2012-04-04 DIAGNOSIS — C3490 Malignant neoplasm of unspecified part of unspecified bronchus or lung: Secondary | ICD-10-CM

## 2012-04-04 DIAGNOSIS — J438 Other emphysema: Secondary | ICD-10-CM | POA: Diagnosis not present

## 2012-04-04 DIAGNOSIS — Z85118 Personal history of other malignant neoplasm of bronchus and lung: Secondary | ICD-10-CM | POA: Diagnosis not present

## 2012-04-04 DIAGNOSIS — Z09 Encounter for follow-up examination after completed treatment for conditions other than malignant neoplasm: Secondary | ICD-10-CM | POA: Insufficient documentation

## 2012-04-04 LAB — CBC WITH DIFFERENTIAL/PLATELET
BASO%: 0.4 % (ref 0.0–2.0)
Eosinophils Absolute: 0 10*3/uL (ref 0.0–0.5)
HCT: 41.4 % (ref 34.8–46.6)
LYMPH%: 19.1 % (ref 14.0–49.7)
MCHC: 34.2 g/dL (ref 31.5–36.0)
MCV: 88.8 fL (ref 79.5–101.0)
MONO#: 0.4 10*3/uL (ref 0.1–0.9)
MONO%: 7.3 % (ref 0.0–14.0)
NEUT%: 72.6 % (ref 38.4–76.8)
Platelets: 125 10*3/uL — ABNORMAL LOW (ref 145–400)
WBC: 4.9 10*3/uL (ref 3.9–10.3)

## 2012-04-04 LAB — COMPREHENSIVE METABOLIC PANEL (CC13)
ALT: 81 U/L — ABNORMAL HIGH (ref 0–55)
Alkaline Phosphatase: 104 U/L (ref 40–150)
CO2: 31 mEq/L — ABNORMAL HIGH (ref 22–29)
Creatinine: 0.9 mg/dL (ref 0.6–1.1)
Sodium: 139 mEq/L (ref 136–145)
Total Bilirubin: 0.7 mg/dL (ref 0.20–1.20)

## 2012-04-04 MED ORDER — IOHEXOL 300 MG/ML  SOLN
80.0000 mL | Freq: Once | INTRAMUSCULAR | Status: AC | PRN
  Administered 2012-04-04: 80 mL via INTRAVENOUS

## 2012-04-07 ENCOUNTER — Telehealth: Payer: Self-pay | Admitting: Internal Medicine

## 2012-04-07 ENCOUNTER — Ambulatory Visit (HOSPITAL_BASED_OUTPATIENT_CLINIC_OR_DEPARTMENT_OTHER): Payer: Medicare Other | Admitting: Internal Medicine

## 2012-04-07 VITALS — BP 137/66 | HR 69 | Temp 98.2°F | Resp 18 | Ht 64.0 in | Wt 137.9 lb

## 2012-04-07 DIAGNOSIS — C3410 Malignant neoplasm of upper lobe, unspecified bronchus or lung: Secondary | ICD-10-CM | POA: Diagnosis not present

## 2012-04-07 NOTE — Telephone Encounter (Signed)
gv and printed appt schedule for pt for Aug....pt aware cs will contact with d/t of ct

## 2012-04-07 NOTE — Progress Notes (Signed)
[facility] [phone]   Fax:([phone]  OFFICE PROGRESS NOTE  Allean Found, MD [address] [place] [address]  PRINCIPAL DIAGNOSIS:  1. 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 2. History of stage IA (T1, N0, MX) non-small cell lung cancer diagnosed in January 2007.  PRIOR THERAPY:  1. Status post right upper lobe superior segmentectomy as well as lymph node sampling under the care of Dr. Edwyna Shell on March 09, 2005. 2. Status post right lower lobe superior segmentectomy with lymph node dissection under the care of Dr. Edwyna Shell on June 13, 2007 and the pathology revealed mixed small cell and adenocarcinoma. 3. Status post 4 cycles of adjuvant chemotherapy with carboplatin and etoposide. Last dose was given September 30, 2007.  CURRENT THERAPY: Observation.  INTERVAL HISTORY: Marcia [name] 78 y.o. female returns to the clinic today for six-month followup visit accompanied her husband. The patient is feeling fine today with no specific complaints except for arthritis from her history of rheumatoid arthritis. The patient denied having any significant weight loss or night sweats. She has no chest pain, shortness breath, cough or hemoptysis. The patient denied having any significant weight loss or night sweats. She had repeat CT scan of the chest performed recently and she is here for evaluation and discussion of her scan results.  MEDICAL HISTORY: Past Medical History  Diagnosis Date  . Diabetes mellitus   . Rheumatoid arthritis   . Cancer     ALLERGIES:  is allergic to amoxicillin; cefaclor; hydrocodone; and sulfa antibiotics.  MEDICATIONS:  Current Outpatient Prescriptions  Medication Sig Dispense Refill  . aspirin 81 MG tablet Take 81 mg by mouth daily.       Marland Kitchen atorvastatin (LIPITOR) 40 MG tablet Take 40 mg by mouth daily.      . calcium-vitamin D (OSCAL) 250-125 MG-UNIT per tablet Take 1  tablet by mouth daily.      Marland Kitchen donepezil (ARICEPT) 10 MG tablet Take 10 mg by mouth at bedtime.      Marland Kitchen ezetimibe (ZETIA) 10 MG tablet Take 10 mg by mouth daily.      . folic acid (FOLVITE) 1 MG tablet Take 1 mg by mouth daily.      . hydroxychloroquine (PLAQUENIL) 200 MG tablet Take 200 mg by mouth 2 (two) times daily.      Marland Kitchen omeprazole (PRILOSEC) 20 MG capsule Take 20 mg by mouth daily.      . predniSONE (DELTASONE) 5 MG tablet Take 5 mg by mouth daily.      . calcium carbonate (OS-CAL) 600 MG TABS Take 600 mg by mouth 2 (two) times daily with a meal.      . HUMALOG MIX 75/25 KWIKPEN (75-25) 100 UNIT/ML SUSP 26 Units every morning. And take 16 units in pm       No current facility-administered medications for this visit.    SURGICAL HISTORY:  Past Surgical History  Procedure Laterality Date  . Cesarean section    . Lung surgery      REVIEW OF SYSTEMS:  A comprehensive review of systems was negative except for: Musculoskeletal: positive for arthralgias   PHYSICAL EXAMINATION: General appearance: alert, cooperative and no distress Head: Normocephalic, without obvious abnormality, atraumatic Neck: no adenopathy Resp: clear to auscultation bilaterally Cardio: regular rate and rhythm, S1, S2 normal, no murmur, click, rub or gallop GI: soft, non-tender; bowel sounds normal; no masses,  no  organomegaly Extremities: extremities normal, atraumatic, no cyanosis or edema  ECOG PERFORMANCE STATUS: 1 - Symptomatic but completely ambulatory  There were no vitals taken for this visit.  LABORATORY DATA: Lab Results  Component Value Date   WBC 4.9 04/04/2012   HGB 14.2 04/04/2012   HCT 41.4 04/04/2012   MCV 88.8 04/04/2012   PLT 125* 04/04/2012      Chemistry      Component Value Date/Time   NA 139 04/04/2012 0921   NA 141 09/04/2011 1011   NA 134* 09/01/2011 1758   K 3.8 04/04/2012 0921   K 4.4 09/04/2011 1011   K 3.5 09/01/2011 1758   CL 99 04/04/2012 0921   CL 99 09/04/2011 1011   CL 98  09/01/2011 1758   CO2 31* 04/04/2012 0921   CO2 28 09/04/2011 1011   CO2 27 09/01/2011 1758   BUN 21.3 04/04/2012 0921   BUN 17 09/04/2011 1011   BUN 12 09/01/2011 1758   CREATININE 0.9 04/04/2012 0921   CREATININE 0.7 09/04/2011 1011   CREATININE 0.62 09/01/2011 1758      Component Value Date/Time   CALCIUM 9.7 04/04/2012 0921   CALCIUM 9.1 09/04/2011 1011   CALCIUM 9.4 09/01/2011 1758   ALKPHOS 104 04/04/2012 0921   ALKPHOS 140* 09/04/2011 1011   ALKPHOS 123* 09/01/2011 1758   AST 43* 04/04/2012 0921   AST 21 09/04/2011 1011   AST 11 09/01/2011 1758   ALT 81* 04/04/2012 0921   ALT 8 09/01/2011 1758   BILITOT 0.70 04/04/2012 0921   BILITOT 0.50 09/04/2011 1011   BILITOT 0.2* 09/01/2011 1758       RADIOGRAPHIC STUDIES: Ct Chest W Contrast  04/04/2012  *RADIOLOGY REPORT*  Clinical Data: Follow-up lung cancer.  Chemotherapy complete.  CT CHEST WITH CONTRAST  Technique:  Multidetector CT imaging of the chest was performed following the standard protocol during bolus administration of intravenous contrast.  Contrast: 80mL OMNIPAQUE IOHEXOL 300 MG/ML  SOLN  Comparison: 09/04/2011.  Findings: No pathologically enlarged mediastinal, hilar or axillary lymph nodes.  Heart size normal.  Extensive coronary artery calcification.  No pericardial effusion.  Postoperative changes and volume loss in the right hemithorax with a loculated collection of air in the posterior right hemithorax, as before.  Mild centrilobular emphysema.  No pleural fluid.  Airway is unremarkable.  Incidental imaging of the upper abdomen shows a 10 mm low attenuation lesion off the right kidney, as before and likely a cyst.  No worrisome lytic or sclerotic lesions.  IMPRESSION:  1.  Postoperative changes in the right hemithorax without evidence of recurrent or metastatic disease. 2.  Extensive coronary artery calcification.   Original Report Authenticated By: Leanna Battles, M.D.     ASSESSMENT: this is a very pleasant 78 years old white female  with recurrent lung cancer presented with mixed small as well as adenocarcinoma of the lung diagnosed in February of 2009 status post resection. She has been observation since that time was no evidence for disease recurrence.  PLAN: I discussed the scan results with the patient and and her husband. I recommended for her to continue on observation with repeat CT scan of the chest in 6 months. She was advised to call me immediately if she has any concerning symptoms in the interval.  All questions were answered. The patient knows to call the clinic with any problems, questions or concerns. We can certainly see the patient much sooner if necessary.

## 2012-04-07 NOTE — Patient Instructions (Addendum)
No evidence for disease recurrence on the recent scan. Followup in 6 months with repeat CT scan of the chest.  

## 2012-04-08 ENCOUNTER — Encounter: Payer: Self-pay | Admitting: Internal Medicine

## 2012-04-12 DIAGNOSIS — R0989 Other specified symptoms and signs involving the circulatory and respiratory systems: Secondary | ICD-10-CM | POA: Diagnosis not present

## 2012-04-12 DIAGNOSIS — E1049 Type 1 diabetes mellitus with other diabetic neurological complication: Secondary | ICD-10-CM | POA: Diagnosis not present

## 2012-05-10 DIAGNOSIS — E11339 Type 2 diabetes mellitus with moderate nonproliferative diabetic retinopathy without macular edema: Secondary | ICD-10-CM | POA: Diagnosis not present

## 2012-05-10 DIAGNOSIS — E1039 Type 1 diabetes mellitus with other diabetic ophthalmic complication: Secondary | ICD-10-CM | POA: Diagnosis not present

## 2012-06-14 ENCOUNTER — Encounter: Payer: Self-pay | Admitting: Neurology

## 2012-06-14 ENCOUNTER — Ambulatory Visit (INDEPENDENT_AMBULATORY_CARE_PROVIDER_SITE_OTHER): Payer: Medicare Other | Admitting: Neurology

## 2012-06-14 VITALS — BP 146/61 | HR 70 | Temp 97.6°F | Ht 65.5 in | Wt 142.0 lb

## 2012-06-14 DIAGNOSIS — F0150 Vascular dementia without behavioral disturbance: Secondary | ICD-10-CM | POA: Diagnosis not present

## 2012-06-14 DIAGNOSIS — E1165 Type 2 diabetes mellitus with hyperglycemia: Secondary | ICD-10-CM

## 2012-06-14 DIAGNOSIS — E118 Type 2 diabetes mellitus with unspecified complications: Secondary | ICD-10-CM | POA: Diagnosis not present

## 2012-06-14 MED ORDER — DONEPEZIL HCL 10 MG PO TABS: 10.0000 mg | ORAL_TABLET | Freq: Every day | ORAL | Status: DC

## 2012-06-14 NOTE — Patient Instructions (Signed)
Diabetes and Exercise  Regular exercise is important and can help:   · Control blood glucose (sugar).  · Decrease blood pressure.  ·   · Control blood lipids (cholesterol, triglycerides).  · Improve overall health.  BENEFITS FROM EXERCISE  · Improved fitness.  · Improved flexibility.  · Improved endurance.  · Increased bone density.  · Weight control.  · Increased muscle strength.  · Decreased body fat.  · Improvement of the body's use of insulin, a hormone.  · Increased insulin sensitivity.  · Reduction of insulin needs.  · Reduced stress and tension.  · Helps you feel better.  People with diabetes who add exercise to their lifestyle gain additional benefits, including:  · Weight loss.  · Reduced appetite.  · Improvement of the body's use of blood glucose.  · Decreased risk factors for heart disease:  · Lowering of cholesterol and triglycerides.  · Raising the level of good cholesterol (high-density lipoproteins, HDL).  · Lowering blood sugar.  · Decreased blood pressure.  TYPE 1 DIABETES AND EXERCISE  · Exercise will usually lower your blood glucose.  · If blood glucose is greater than 240 mg/dl, check urine ketones. If ketones are present, do not exercise.  · Location of the insulin injection sites may need to be adjusted with exercise. Avoid injecting insulin into areas of the body that will be exercised. For example, avoid injecting insulin into:  · The arms when playing tennis.  · The legs when jogging. For more information, discuss this with your caregiver.  · Keep a record of:  · Food intake.  · Type and amount of exercise.  · Expected peak times of insulin action.  · Blood glucose levels.  Do this before, during, and after exercise. Review your records with your caregiver. This will help you to develop guidelines for adjusting food intake and insulin amounts.   TYPE 2 DIABETES AND EXERCISE  · Regular physical activity can help control blood glucose.  · Exercise is important because it may:  · Increase the  body's sensitivity to insulin.  · Improve blood glucose control.  · Exercise reduces the risk of heart disease. It decreases serum cholesterol and triglycerides. It also lowers blood pressure.  · Those who take insulin or oral hypoglycemic agents should watch for signs of hypoglycemia. These signs include dizziness, shaking, sweating, chills, and confusion.  · Body water is lost during exercise. It must be replaced. This will help to avoid loss of body fluids (dehydration) or heat stroke.  Be sure to talk to your caregiver before starting an exercise program to make sure it is safe for you. Remember, any activity is better than none.   Document Released: 04/25/2003 Document Revised: 04/27/2011 Document Reviewed: 08/09/2008  [employer]® Patient Information ©2013 [employer].

## 2012-06-14 NOTE — Progress Notes (Signed)
[facility]  Provider:  Dr Vickey Huger Referring Provider:  Primary Care Physician:  Allean Found, MD  Chief Complaint  Patient presents with  . Memory Loss    HPI:  Marcia [name] is a 78 y.o. female here as a referral from Dr. Katrinka Blazing for memory loss.  The patient was evaluated by spinal tap for a possible NPH, but showed no improvement after the procedure. See 2012 notes.  At that time her husband , an Airline pilot , had to be in hospital and her diabetes  became uncontrolled . The patient has been followed for over 40 years by Dr. Evlyn Kanner for her diabetes mellitus.  She had originally presented at 78 years of age was confusional spells were possibly attributable to a variable blood glucose levels. Per M.M.S.E test in November 2013 B. 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  Review of Systems: Out of a complete 14 system review, the patient complains of only the following symptoms, and all other reviewed systems are negative.  Memory loss, MMSE today at 19 points AFT only 3 points.  "no goofy spells over the last 12 month ' blood sugar is better controlled since her husband gives her the medication.  She cooks still some, not driving.    History   Social History  . Marital Status: Married    Spouse Name: N/A    Number of Children: 3  . Years of Education: College   Occupational History  . Not on file.   Social History Main Topics  . Smoking status: Former Games developer  . Smokeless tobacco: Former Neurosurgeon    Quit date: 02/17/2004  . Alcohol Use: No  . Drug Use: No  . Sexually Active: Not on file   Other Topics Concern  . Not on file   Social History Narrative   Pt lives at home with her spouse.   Caffeine Use-2 cups daily    No family history on file.  Past Medical History  Diagnosis Date  . Diabetes mellitus   . Rheumatoid arthritis   . Cancer   .  Carcinoma     lung cell-stage1  . Colonic polyp   . Neuropathy     peripheral  . Goiter     with hypothyrodism  . Osteopenia   . Hyperlipemia   . Lymph node disorder 10R,11R    excision    Past Surgical History  Procedure Laterality Date  . Cesarean section    . Lung surgery Right 2006    recurrance 2009,lower lobe superior segment,resection:2007  . Cataract extraction Bilateral   . Wrist surgery Right   . Foot surgery Right     x3  . Lung surgery Right     secondary lower lobe margin, excision,no tumor indentified      Allergies as of 06/14/2012 - Review Complete 06/14/2012  Allergen Reaction Noted  . Amoxicillin Nausea And Vomiting 03/09/2011  . Cefaclor Nausea And Vomiting 03/09/2011  . Hydrocodone Nausea And Vomiting 03/09/2011  . Sulfa antibiotics  03/09/2011    Vitals: BP 146/61  Pulse 70  Temp(Src) 97.6 F (36.4 C) (Oral)  Ht 5' 5.5" (1.664 m)  Wt 142 lb (64.411 kg)  BMI 23.26 kg/m2 Last Weight:  Wt Readings from Last 1 Encounters:  06/14/12 142 lb (64.411 kg)   Last Height:   Ht Readings from Last 1 Encounters:  06/14/12 5' 5.5" (1.664  m)    Physical exam:  General: The patient is awake, alert and appears not in acute distress. The patient is well groomed. Head: Normocephalic, atraumatic. Neck is supple.  Cardiovascular:  Regular rate and rhythm, without  murmurs or carotid bruit, and without distended neck veins. Respiratory: Lungs are clear to auscultation. Skin:  Without evidence of edema, or rash Trunk: BMI is normal and patient  has normal posture.  Neurologic exam : The patient is awake and alert, oriented to place and time.  Memory subjective  described as declined . MMSE 19 points a out of 30/  There is abnormal attention span & concentration ability.  Speech is fluent with mild dysarthria, not  dysphonia  Not ,aphasia.  Mood and affect are appropriate.  Cranial nerves: Pupils are sluggishly reactive to light. Funduscopic exam status  post cataract(  2011 )  evidence of pallor or edema.  Extraocular movements  in vertical and horizontal planes intact and without nystagmus. Visual fields by finger perimetry are intact. Hearing to finger rub intact.  Facial sensation intact to fine touch. Facial motor strength is symmetric and tongue and uvula move midline.  Motor exam:   Normal tone and normal muscle bulk and symmetric normal strength in all extremities.  Sensory:  Fine touch, pinprick and vibration were tested in all extremities. Proprioception is tested in the upper extremities only.  Coordination: Rapid alternating movements in the fingers/hands is tested and normal. Finger-to-nose maneuver tested and normal without evidence of ataxia, dysmetria or tremor.  Gait and station: Patient walks without assistive device and is able and assisted stool climb up to the exam table. Strength within normal limits. Stance is stable and normal.  Tandem gait is significantly impaired  ,  turns with 5 Steps- turs are  are   Slightly fragmented. Romberg testing is  Normal. Fall risk at 10 points.   Deep tendon reflexes: in the  upper and lower extremities are symmetric, patella and achilles reflexes are attenuated.  She lost one toe.   Babinski maneuver response is downgoing.   Assessment:  Dementia; microvascular with  Exacerbation from DM .  After having better control of the blood glucose she has been declining much slower.   physical and neurologic examination, review of laboratory studies, imaging, neurophysiology testing and pre-existing records, assessment will be reviewed on the problem list.  Plan:  Treatment plan and additional workup will be reviewed under Problem List. Continue  Aricept.

## 2012-07-19 DIAGNOSIS — E039 Hypothyroidism, unspecified: Secondary | ICD-10-CM | POA: Diagnosis not present

## 2012-07-19 DIAGNOSIS — D696 Thrombocytopenia, unspecified: Secondary | ICD-10-CM | POA: Diagnosis not present

## 2012-07-19 DIAGNOSIS — F0390 Unspecified dementia without behavioral disturbance: Secondary | ICD-10-CM | POA: Diagnosis not present

## 2012-07-19 DIAGNOSIS — E7800 Pure hypercholesterolemia, unspecified: Secondary | ICD-10-CM | POA: Diagnosis not present

## 2012-07-19 DIAGNOSIS — E119 Type 2 diabetes mellitus without complications: Secondary | ICD-10-CM | POA: Diagnosis not present

## 2012-08-25 DIAGNOSIS — D696 Thrombocytopenia, unspecified: Secondary | ICD-10-CM | POA: Diagnosis not present

## 2012-09-12 DIAGNOSIS — L570 Actinic keratosis: Secondary | ICD-10-CM | POA: Diagnosis not present

## 2012-09-12 DIAGNOSIS — Z85828 Personal history of other malignant neoplasm of skin: Secondary | ICD-10-CM | POA: Diagnosis not present

## 2012-09-12 DIAGNOSIS — D692 Other nonthrombocytopenic purpura: Secondary | ICD-10-CM | POA: Diagnosis not present

## 2012-09-19 DIAGNOSIS — Z79899 Other long term (current) drug therapy: Secondary | ICD-10-CM | POA: Diagnosis not present

## 2012-09-19 DIAGNOSIS — M2550 Pain in unspecified joint: Secondary | ICD-10-CM | POA: Diagnosis not present

## 2012-09-19 DIAGNOSIS — M069 Rheumatoid arthritis, unspecified: Secondary | ICD-10-CM | POA: Diagnosis not present

## 2012-10-04 DIAGNOSIS — R7989 Other specified abnormal findings of blood chemistry: Secondary | ICD-10-CM | POA: Diagnosis not present

## 2012-10-05 ENCOUNTER — Ambulatory Visit (HOSPITAL_COMMUNITY)
Admission: RE | Admit: 2012-10-05 | Discharge: 2012-10-05 | Disposition: A | Discharge status: Home / Self Care | Payer: Medicare Other | Source: Ambulatory Visit | Attending: Internal Medicine | Admitting: Internal Medicine

## 2012-10-05 ENCOUNTER — Other Ambulatory Visit (HOSPITAL_BASED_OUTPATIENT_CLINIC_OR_DEPARTMENT_OTHER): Payer: Medicare Other | Admitting: Lab

## 2012-10-05 DIAGNOSIS — C3490 Malignant neoplasm of unspecified part of unspecified bronchus or lung: Secondary | ICD-10-CM

## 2012-10-05 DIAGNOSIS — Z9221 Personal history of antineoplastic chemotherapy: Secondary | ICD-10-CM | POA: Insufficient documentation

## 2012-10-05 DIAGNOSIS — C3410 Malignant neoplasm of upper lobe, unspecified bronchus or lung: Secondary | ICD-10-CM | POA: Diagnosis not present

## 2012-10-05 LAB — CBC WITH DIFFERENTIAL/PLATELET
BASO%: 0.5 % (ref 0.0–2.0)
EOS%: 0.7 % (ref 0.0–7.0)
LYMPH%: 17.3 % (ref 14.0–49.7)
MCHC: 33.6 g/dL (ref 31.5–36.0)
MCV: 89.4 fL (ref 79.5–101.0)
MONO%: 8.4 % (ref 0.0–14.0)
Platelets: 121 10*3/uL — ABNORMAL LOW (ref 145–400)
RBC: 4.49 10*6/uL (ref 3.70–5.45)
RDW: 14 % (ref 11.2–14.5)

## 2012-10-05 LAB — COMPREHENSIVE METABOLIC PANEL (CC13)
AST: 47 U/L — ABNORMAL HIGH (ref 5–34)
Albumin: 3.2 g/dL — ABNORMAL LOW (ref 3.5–5.0)
BUN: 20.6 mg/dL (ref 7.0–26.0)
CO2: 28 mEq/L (ref 22–29)
Calcium: 9 mg/dL (ref 8.4–10.4)
Chloride: 103 mEq/L (ref 98–109)
Glucose: 388 mg/dl — ABNORMAL HIGH (ref 70–140)
Potassium: 4.8 mEq/L (ref 3.5–5.1)

## 2012-10-05 MED ORDER — IOHEXOL 300 MG/ML  SOLN
80.0000 mL | Freq: Once | INTRAMUSCULAR | Status: AC | PRN
  Administered 2012-10-05: 80 mL via INTRAVENOUS

## 2012-10-06 ENCOUNTER — Telehealth: Payer: Self-pay | Admitting: Internal Medicine

## 2012-10-06 ENCOUNTER — Ambulatory Visit (HOSPITAL_BASED_OUTPATIENT_CLINIC_OR_DEPARTMENT_OTHER): Payer: Medicare Other | Admitting: Internal Medicine

## 2012-10-06 ENCOUNTER — Encounter: Payer: Self-pay | Admitting: Internal Medicine

## 2012-10-06 VITALS — BP 148/55 | HR 72 | Temp 97.7°F | Resp 18 | Ht 65.5 in | Wt 145.1 lb

## 2012-10-06 DIAGNOSIS — C3490 Malignant neoplasm of unspecified part of unspecified bronchus or lung: Secondary | ICD-10-CM

## 2012-10-06 NOTE — Progress Notes (Signed)
[facility] Telephone:(336) [phone]   Fax:([phone]  OFFICE PROGRESS NOTE  Allean Found, MD [address] [place] [address]  PRINCIPAL DIAGNOSIS:  1. 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 2. History of stage IA (T1, N0, MX) non-small cell lung cancer diagnosed in January 2007.  PRIOR THERAPY:  1. Status post right upper lobe superior segmentectomy as well as lymph node sampling under the care of Dr. Edwyna Shell on March 09, 2005. 2. Status post right lower lobe superior segmentectomy with lymph node dissection under the care of Dr. Edwyna Shell on June 13, 2007 and the pathology revealed mixed small cell and adenocarcinoma. 3. Status post 4 cycles of adjuvant chemotherapy with carboplatin and etoposide. Last dose was given September 30, 2007.  CURRENT THERAPY: Observation.   INTERVAL HISTORY: Marcia [name] 79 y.o. female returns to the clinic today for six-month follow up visit accompanied her husband. The patient is feeling fine today with no specific complaints except for mild fatigue. She denied having any significant nausea or vomiting. She has no chest pain, shortness breath, cough or hemoptysis.she had repeat CT scan of the chest performed recently and she is here for evaluation and discussion of her scan results.  MEDICAL HISTORY: Past Medical History  Diagnosis Date  . Diabetes mellitus   . Rheumatoid arthritis(714.0)   . Cancer   . Carcinoma     lung cell-stage1  . Colonic polyp   . Neuropathy     peripheral  . Goiter     with hypothyrodism  . Osteopenia   . Hyperlipemia   . Lymph node disorder 10R,11R    excision  . Memory loss     ALLERGIES:  is allergic to amoxicillin; cefaclor; hydrocodone; and sulfa antibiotics.  MEDICATIONS:  Current Outpatient Prescriptions  Medication Sig Dispense Refill  . aspirin 81 MG tablet Take 81 mg by mouth daily.       Marland Kitchen atorvastatin (LIPITOR) 40 MG  tablet Take 40 mg by mouth daily.      Marland Kitchen donepezil (ARICEPT) 10 MG tablet Take 1 tablet (10 mg total) by mouth at bedtime. Patient needs to take medication daily at the same time.  30 tablet  11  . donepezil (ARICEPT) 10 MG tablet Take 1 tablet (10 mg total) by mouth at bedtime. Patient needs to take medication daily at the same time.  30 tablet  11  . ezetimibe (ZETIA) 10 MG tablet Take 10 mg by mouth daily.      Marland Kitchen HUMALOG MIX 75/25 KWIKPEN (75-25) 100 UNIT/ML SUSP 26 Units every morning. And take 16 units in pm      . hydroxychloroquine (PLAQUENIL) 200 MG tablet Take 200 mg by mouth 2 (two) times daily.      Marland Kitchen omeprazole (PRILOSEC) 20 MG capsule Take 20 mg by mouth daily.      . predniSONE (DELTASONE) 5 MG tablet Take 5 mg by mouth daily.       No current facility-administered medications for this visit.    SURGICAL HISTORY:  Past Surgical History  Procedure Laterality Date  . Cesarean section    . Lung surgery Right 2006    recurrance 2009,lower lobe superior segment,resection:2007  . Cataract extraction Bilateral   . Wrist surgery Right   . Foot surgery Right     x3  . Lung surgery Right     secondary lower lobe margin, excision,no tumor indentified  REVIEW OF SYSTEMS:  A comprehensive review of systems was negative.   PHYSICAL EXAMINATION: General appearance: alert, cooperative and no distress Head: Normocephalic, without obvious abnormality, atraumatic Neck: no adenopathy Lymph nodes: Cervical, supraclavicular, and axillary nodes normal. Resp: clear to auscultation bilaterally Cardio: regular rate and rhythm, S1, S2 normal, no murmur, click, rub or gallop GI: soft, non-tender; bowel sounds normal; no masses,  no organomegaly Extremities: extremities normal, atraumatic, no cyanosis or edema  ECOG PERFORMANCE STATUS: 1 - Symptomatic but completely ambulatory  Blood pressure 148/55, pulse 72, temperature 97.7 F (36.5 C), temperature source Oral, resp. rate 18, height 5'  5.5" (1.664 m), weight 145 lb 1.6 oz (65.817 kg).  LABORATORY DATA: Lab Results  Component Value Date   WBC 5.3 10/05/2012   HGB 13.5 10/05/2012   HCT 40.2 10/05/2012   MCV 89.4 10/05/2012   PLT 121* 10/05/2012      Chemistry      Component Value Date/Time   NA 139 10/05/2012 0908   NA 141 09/04/2011 1011   NA 134* 09/01/2011 1758   K 4.8 10/05/2012 0908   K 4.4 09/04/2011 1011   K 3.5 09/01/2011 1758   CL 99 04/04/2012 0921   CL 99 09/04/2011 1011   CL 98 09/01/2011 1758   CO2 28 10/05/2012 0908   CO2 28 09/04/2011 1011   CO2 27 09/01/2011 1758   BUN 20.6 10/05/2012 0908   BUN 17 09/04/2011 1011   BUN 12 09/01/2011 1758   CREATININE 1.0 10/05/2012 0908   CREATININE 0.7 09/04/2011 1011   CREATININE 0.62 09/01/2011 1758      Component Value Date/Time   CALCIUM 9.0 10/05/2012 0908   CALCIUM 9.1 09/04/2011 1011   CALCIUM 9.4 09/01/2011 1758   ALKPHOS 121 10/05/2012 0908   ALKPHOS 140* 09/04/2011 1011   ALKPHOS 123* 09/01/2011 1758   AST 47* 10/05/2012 0908   AST 21 09/04/2011 1011   AST 11 09/01/2011 1758   ALT 93* 10/05/2012 0908   ALT 23 09/04/2011 1011   ALT 8 09/01/2011 1758   BILITOT 0.41 10/05/2012 0908   BILITOT 0.50 09/04/2011 1011   BILITOT 0.2* 09/01/2011 1758       RADIOGRAPHIC STUDIES: Ct Chest W Contrast  10/05/2012   *RADIOLOGY REPORT*  Clinical Data: Lung cancer diagnosed 2009.  Right lung resection. Chemotherapy complete.  CT CHEST WITH CONTRAST  Technique:  Multidetector CT imaging of the chest was performed following the standard protocol during bolus administration of intravenous contrast.  Contrast: 80mL OMNIPAQUE IOHEXOL 300 MG/ML  SOLN  Comparison: CT 04/04/2012  Findings: There is postoperative change in the right upper hemithorax consistent with partial lobectomy.  There is linear scarring and resection sutures in the upper lobe.  No new nodularity.  Left lung is clear.  No axillary or supraclavicular lymphadenopathy.  No mediastinal or hilar lymphadenopathy.  No pericardial  fluid.  Esophagus is normal.  Limited view of the upper abdomen demonstrates normal adrenal glands.  No focal hepatic lesion on limited view of the liver.  Degenerative osteophytosis of the thoracic spine.  IMPRESSION:  1.  No evidence of lung cancer recurrence. 2.  Stable postoperative change in the right upper hemithorax.   Original Report Authenticated By: Genevive Bi, M.D.    ASSESSMENT AND PLAN: this is a very pleasant 78 years old white female with recurrent non-small cell lung cancer. She is currently on observation with no evidence for disease recurrence on the recent scan. I recommended for the patient to  continue on observation for now with repeat CT scan of the chest in 6 months. She was advised to call immediately if she has any concerning symptoms in the interval.  The patient voices understanding of current disease status and treatment options and is in agreement with the current care plan.  All questions were answered. The patient knows to call the clinic with any problems, questions or concerns. We can certainly see the patient much sooner if necessary.

## 2012-10-06 NOTE — Telephone Encounter (Signed)
Gave pt appt for lab and MD on February 2015 °

## 2012-10-11 DIAGNOSIS — E11319 Type 2 diabetes mellitus with unspecified diabetic retinopathy without macular edema: Secondary | ICD-10-CM | POA: Diagnosis not present

## 2012-10-11 DIAGNOSIS — F068 Other specified mental disorders due to known physiological condition: Secondary | ICD-10-CM | POA: Diagnosis not present

## 2012-10-11 DIAGNOSIS — C3490 Malignant neoplasm of unspecified part of unspecified bronchus or lung: Secondary | ICD-10-CM | POA: Diagnosis not present

## 2012-10-11 DIAGNOSIS — E1049 Type 1 diabetes mellitus with other diabetic neurological complication: Secondary | ICD-10-CM | POA: Diagnosis not present

## 2012-10-11 DIAGNOSIS — E785 Hyperlipidemia, unspecified: Secondary | ICD-10-CM | POA: Diagnosis not present

## 2012-10-11 DIAGNOSIS — E1142 Type 2 diabetes mellitus with diabetic polyneuropathy: Secondary | ICD-10-CM | POA: Diagnosis not present

## 2012-10-11 DIAGNOSIS — E538 Deficiency of other specified B group vitamins: Secondary | ICD-10-CM | POA: Diagnosis not present

## 2012-10-11 DIAGNOSIS — M069 Rheumatoid arthritis, unspecified: Secondary | ICD-10-CM | POA: Diagnosis not present

## 2012-10-11 DIAGNOSIS — E049 Nontoxic goiter, unspecified: Secondary | ICD-10-CM | POA: Diagnosis not present

## 2012-10-20 DIAGNOSIS — R945 Abnormal results of liver function studies: Secondary | ICD-10-CM | POA: Diagnosis not present

## 2012-10-27 DIAGNOSIS — R748 Abnormal levels of other serum enzymes: Secondary | ICD-10-CM | POA: Diagnosis not present

## 2012-10-27 DIAGNOSIS — G4700 Insomnia, unspecified: Secondary | ICD-10-CM | POA: Diagnosis not present

## 2012-10-27 DIAGNOSIS — Z23 Encounter for immunization: Secondary | ICD-10-CM | POA: Diagnosis not present

## 2012-11-06 ENCOUNTER — Ambulatory Visit (INDEPENDENT_AMBULATORY_CARE_PROVIDER_SITE_OTHER): Payer: Medicare Other | Admitting: Family Medicine

## 2012-11-06 VITALS — BP 128/82 | HR 86 | Temp 99.6°F | Resp 17 | Ht 66.0 in | Wt 147.0 lb

## 2012-11-06 DIAGNOSIS — L03115 Cellulitis of right lower limb: Secondary | ICD-10-CM

## 2012-11-06 DIAGNOSIS — L02419 Cutaneous abscess of limb, unspecified: Secondary | ICD-10-CM

## 2012-11-06 LAB — POCT CBC
Granulocyte percent: 82.7 %G — AB (ref 37–80)
HCT, POC: 45.2 % (ref 37.7–47.9)
MCH, POC: 30.1 pg (ref 27–31.2)
MCV: 94.3 fL (ref 80–97)
POC LYMPH PERCENT: 11.1 %L (ref 10–50)
RDW, POC: 14.4 %
WBC: 10.1 10*3/uL (ref 4.6–10.2)

## 2012-11-06 MED ORDER — CEFTRIAXONE SODIUM 1 G IJ SOLR
1.0000 g | INTRAMUSCULAR | Status: DC
  Administered 2012-11-06 – 2012-11-08 (×2): 1 g via INTRAMUSCULAR

## 2012-11-06 MED ORDER — CEPHALEXIN 500 MG PO CAPS: 500.0000 mg | ORAL_CAPSULE | Freq: Three times a day (TID) | ORAL | Status: DC

## 2012-11-06 MED ORDER — TRAMADOL HCL 50 MG PO TABS: 50.0000 mg | ORAL_TABLET | Freq: Three times a day (TID) | ORAL | Status: DC | PRN

## 2012-11-06 MED ORDER — DOXYCYCLINE HYCLATE 100 MG PO CAPS: 100.0000 mg | ORAL_CAPSULE | Freq: Two times a day (BID) | ORAL | Status: DC

## 2012-11-06 NOTE — Patient Instructions (Addendum)
Start the doxycycline with your first dose tonight. Start the cephalexin tomorrow night.  That way if you develop nausea and vomiting we can tell which one you are more likely to be having the adverse reaction to (you have nausea and vomiting to several antibiotics that are related to the cephalexin.)  Cellulitis Cellulitis is an infection of the skin and the tissue beneath it. The infected area is usually red and tender. Cellulitis occurs most often in the arms and lower legs.  CAUSES  Cellulitis is caused by bacteria that enter the skin through cracks or cuts in the skin. The most common types of bacteria that cause cellulitis are Staphylococcus and Streptococcus. SYMPTOMS   Redness and warmth.  Swelling.  Tenderness or pain.  Fever. DIAGNOSIS  Your caregiver can usually determine what is wrong based on a physical exam. Blood tests may also be done. TREATMENT  Treatment usually involves taking an antibiotic medicine. HOME CARE INSTRUCTIONS   Take your antibiotics as directed. Finish them even if you start to feel better.  Keep the infected arm or leg elevated to reduce swelling.  Apply a warm cloth to the affected area up to 4 times per day to relieve pain.  Only take over-the-counter or prescription medicines for pain, discomfort, or fever as directed by your caregiver.  Keep all follow-up appointments as directed by your caregiver. SEEK MEDICAL CARE IF:   You notice red streaks coming from the infected area.  Your red area gets larger or turns dark in color.  Your bone or joint underneath the infected area becomes painful after the skin has healed.  Your infection returns in the same area or another area.  You notice a swollen bump in the infected area.  You develop new symptoms. SEEK IMMEDIATE MEDICAL CARE IF:   You have a fever.  You feel very sleepy.  You develop vomiting or diarrhea.  You have a general ill feeling (malaise) with muscle aches and  pains. MAKE SURE YOU:   Understand these instructions.  Will watch your condition.  Will get help right away if you are not doing well or get worse. Document Released: 11/12/2004 Document Revised: 08/04/2011 Document Reviewed: 04/20/2011 [facility] Patient Information 2014 [place], [place].

## 2012-11-06 NOTE — Progress Notes (Signed)
Subjective:    Patient ID: Marcia [name], female    DOB: 1934-03-26, 78 y.o.   MRN: [id number] Chief Complaint  Patient presents with  . Leg Pain    of unknown origin     HPI All day yesterday was out of it like she had low blood sugar.  Past Medical History  Diagnosis Date  . Diabetes mellitus   . Rheumatoid arthritis(714.0)   . Cancer   . Carcinoma     lung cell-stage1  . Colonic polyp   . Neuropathy     peripheral  . Goiter     with hypothyrodism  . Osteopenia   . Hyperlipemia   . Lymph node disorder 10R,11R    excision  . Memory loss    Current Outpatient Prescriptions on File Prior to Visit  Medication Sig Dispense Refill  . aspirin 81 MG tablet Take 81 mg by mouth daily.       Marland Kitchen atorvastatin (LIPITOR) 40 MG tablet Take 40 mg by mouth daily.      Marland Kitchen ezetimibe (ZETIA) 10 MG tablet Take 10 mg by mouth daily.      Marland Kitchen HUMALOG MIX 75/25 KWIKPEN (75-25) 100 UNIT/ML SUSP 26 Units every morning. And take 16 units in pm      . predniSONE (DELTASONE) 5 MG tablet Take 5 mg by mouth daily.       No current facility-administered medications on file prior to visit.   Allergies  Allergen Reactions  . Amoxicillin Nausea And Vomiting  . Cefaclor Nausea And Vomiting  . Hydrocodone Nausea And Vomiting  . Sulfa Antibiotics     " legs give out"    Review of Systems  Constitutional: Positive for activity change, appetite change and fatigue. Negative for fever, chills and diaphoresis.  Musculoskeletal: Positive for myalgias. Negative for arthralgias and joint swelling.  Skin: Positive for color change and rash. Negative for pallor and wound.  Neurological: Positive for dizziness, tremors, weakness and light-headedness. Negative for syncope, numbness and headaches.  Hematological: Negative for adenopathy. Does not bruise/bleed easily.  Psychiatric/Behavioral: Positive for sleep disturbance.      BP 128/82  Pulse 86  Temp(Src) 99.6 F (37.6 C) (Oral)  Resp 17  Ht 5\' 6"  (1.676  m)  Wt 147 lb (66.679 kg)  BMI 23.74 kg/m2  SpO2 93% Objective:   Physical Exam  Constitutional: She is oriented to person, place, and time. She appears well-developed and well-nourished. No distress.  HENT:  Head: Normocephalic and atraumatic.  Right Ear: External ear normal.  Left Ear: External ear normal.  Eyes: Conjunctivae are normal. No scleral icterus.  Neck: Normal range of motion. Neck supple. No thyromegaly present.  Cardiovascular: Normal rate, regular rhythm, normal heart sounds and intact distal pulses.   Pulmonary/Chest: Effort normal and breath sounds normal. No respiratory distress.  Musculoskeletal: She exhibits no edema.  Lymphadenopathy:    She has no cervical adenopathy.  Neurological: She is alert and oriented to person, place, and time.  Skin: Skin is warm and dry. Rash noted. Rash is macular. She is not diaphoretic. There is erythema.  Macular erythematous indurated tender blanchable rash over right leg  Psychiatric: She has a normal mood and affect. Her behavior is normal.          Results for orders placed in visit on 11/06/12  POCT CBC      Result Value Range   WBC 10.1  4.6 - 10.2 K/uL   Lymph, poc 1.1  0.6 -  3.4   POC LYMPH PERCENT 11.1  10 - 50 %L   MID (cbc) 0.6  0 - 0.9   POC MID % 6.2  0 - 12 %M   POC Granulocyte 8.4 (*) 2 - 6.9   Granulocyte percent 82.7 (*) 37 - 80 %G   RBC 4.79  4.04 - 5.48 M/uL   Hemoglobin 14.4  12.2 - 16.2 g/dL   HCT, POC 52.8  41.3 - 47.9 %   MCV 94.3  80 - 97 fL   MCH, POC 30.1  27 - 31.2 pg   MCHC 31.9  31.8 - 35.4 g/dL   RDW, POC 24.4     Platelet Count, POC 144  142 - 424 K/uL   MPV 9.7  0 - 99.8 fL    Assessment & Plan:   Cellulitis of leg, right - Plan: POCT CBC, DISCONTINUED: cefTRIAXone (ROCEPHIN) injection 1 g  Meds ordered this encounter  Medications  . doxycycline (VIBRAMYCIN) 100 MG capsule    Sig: Take 1 capsule (100 mg total) by mouth 2 (two) times daily.    Dispense:  20 capsule    Refill:   0  . cephALEXin (KEFLEX) 500 MG capsule    Sig: Take 1 capsule (500 mg total) by mouth 3 (three) times daily.    Dispense:  30 capsule    Refill:  0  . cefTRIAXone (ROCEPHIN) injection 1 g    Sig:   . traMADol (ULTRAM) 50 MG tablet    Sig: Take 1 tablet (50 mg total) by mouth every 8 (eight) hours as needed for pain.    Dispense:  30 tablet    Refill:  0   Norberto Sorenson, MD MPH

## 2012-11-08 ENCOUNTER — Ambulatory Visit (INDEPENDENT_AMBULATORY_CARE_PROVIDER_SITE_OTHER): Payer: Medicare Other | Admitting: Internal Medicine

## 2012-11-08 VITALS — BP 130/70 | HR 71 | Temp 97.6°F | Resp 18 | Ht 65.5 in | Wt 148.2 lb

## 2012-11-08 DIAGNOSIS — L03115 Cellulitis of right lower limb: Secondary | ICD-10-CM

## 2012-11-08 DIAGNOSIS — L02419 Cutaneous abscess of limb, unspecified: Secondary | ICD-10-CM

## 2012-11-08 LAB — POCT CBC
HCT, POC: 43.1 % (ref 37.7–47.9)
Hemoglobin: 13.8 g/dL (ref 12.2–16.2)
Lymph, poc: 1.3 (ref 0.6–3.4)
MCH, POC: 30.1 pg (ref 27–31.2)
MCHC: 32 g/dL (ref 31.8–35.4)
MCV: 94.2 fL (ref 80–97)
MID (cbc): 0.5 (ref 0–0.9)
POC LYMPH PERCENT: 17.9 %L (ref 10–50)
RBC: 4.58 M/uL (ref 4.04–5.48)
RDW, POC: 14.9 %
WBC: 7.1 10*3/uL (ref 4.6–10.2)

## 2012-11-08 LAB — GLUCOSE, POCT (MANUAL RESULT ENTRY): POC Glucose: 74 mg/dl (ref 70–99)

## 2012-11-08 NOTE — Patient Instructions (Signed)
Cellulitis Cellulitis is an infection of the skin and the tissue beneath it. The infected area is usually red and tender. Cellulitis occurs most often in the arms and lower legs.  CAUSES  Cellulitis is caused by bacteria that enter the skin through cracks or cuts in the skin. The most common types of bacteria that cause cellulitis are Staphylococcus and Streptococcus. SYMPTOMS   Redness and warmth.  Swelling.  Tenderness or pain.  Fever. DIAGNOSIS  Your caregiver can usually determine what is wrong based on a physical exam. Blood tests may also be done. TREATMENT  Treatment usually involves taking an antibiotic medicine. HOME CARE INSTRUCTIONS   Take your antibiotics as directed. Finish them even if you start to feel better.  Keep the infected arm or leg elevated to reduce swelling.  Apply a warm cloth to the affected area up to 4 times per day to relieve pain.  Only take over-the-counter or prescription medicines for pain, discomfort, or fever as directed by your caregiver.  Keep all follow-up appointments as directed by your caregiver. SEEK MEDICAL CARE IF:   You notice red streaks coming from the infected area.  Your red area gets larger or turns dark in color.  Your bone or joint underneath the infected area becomes painful after the skin has healed.  Your infection returns in the same area or another area.  You notice a swollen bump in the infected area.  You develop new symptoms. SEEK IMMEDIATE MEDICAL CARE IF:   You have a fever.  You feel very sleepy.  You develop vomiting or diarrhea.  You have a general ill feeling (malaise) with muscle aches and pains. MAKE SURE YOU:   Understand these instructions.  Will watch your condition.  Will get help right away if you are not doing well or get worse. Document Released: 11/12/2004 Document Revised: 08/04/2011 Document Reviewed: 04/20/2011 [employer] Patient Information 2014 [employer].  

## 2012-11-08 NOTE — Progress Notes (Signed)
  Subjective:    Patient ID: Marcia [name], female    DOB: 1934/07/20, 78 y.o.   MRN: [id number]  HPI Patient presents for follow up right leg cellulitis. Has improved. Has been fuzzy headed Less painful  Review of Systems     Objective:   Physical Exam  Vitals reviewed. Constitutional: She is oriented to person, place, and time. She appears well-developed and well-nourished. No distress.  HENT:  Head: Normocephalic.  Eyes: EOM are normal. No scleral icterus.  Cardiovascular: Normal rate.   Pulmonary/Chest: Effort normal.  Musculoskeletal: Normal range of motion.  Neurological: She is alert and oriented to person, place, and time. She exhibits normal muscle tone. Coordination normal.  Skin: There is erythema.  Psychiatric: She has a normal mood and affect.   Red marked cellulitis still contained Results for orders placed in visit on 11/08/12  POCT CBC      Result Value Range   WBC 7.1  4.6 - 10.2 K/uL   Lymph, poc 1.3  0.6 - 3.4   POC LYMPH PERCENT 17.9  10 - 50 %L   MID (cbc) 0.5  0 - 0.9   POC MID % 6.9  0 - 12 %M   POC Granulocyte 5.3  2 - 6.9   Granulocyte percent 75.2  37 - 80 %G   RBC 4.58  4.04 - 5.48 M/uL   Hemoglobin 13.8  12.2 - 16.2 g/dL   HCT, POC 57.8  46.9 - 47.9 %   MCV 94.2  80 - 97 fL   MCH, POC 30.1  27 - 31.2 pg   MCHC 32.0  31.8 - 35.4 g/dL   RDW, POC 62.9     Platelet Count, POC 173  142 - 424 K/uL   MPV 12.0  0 - 99.8 fL  GLUCOSE, POCT (MANUAL RESULT ENTRY)      Result Value Range   POC Glucose 74  70 - 99 mg/dl   Stat apple juice po now      Assessment & Plan:  Diabetes/Cellulitis Hold tramadol   Rocephen 1g RTC 1pm tomorrow see Ms. Debbra Riding PAc

## 2012-11-08 NOTE — Progress Notes (Signed)
  Subjective:    Patient ID: Marcia [name], female    DOB: 1934/02/25, 78 y.o.   MRN: [id number]  HPI    Review of Systems     Objective:   Physical Exam        Assessment & Plan:

## 2012-11-09 ENCOUNTER — Ambulatory Visit (INDEPENDENT_AMBULATORY_CARE_PROVIDER_SITE_OTHER): Payer: Medicare Other | Admitting: Emergency Medicine

## 2012-11-09 VITALS — BP 120/70 | HR 76 | Temp 97.7°F | Resp 16 | Ht 65.5 in | Wt 148.0 lb

## 2012-11-09 DIAGNOSIS — L03115 Cellulitis of right lower limb: Secondary | ICD-10-CM

## 2012-11-09 DIAGNOSIS — L02419 Cutaneous abscess of limb, unspecified: Secondary | ICD-10-CM

## 2012-11-09 DIAGNOSIS — E119 Type 2 diabetes mellitus without complications: Secondary | ICD-10-CM

## 2012-11-09 LAB — POCT CBC
Granulocyte percent: 71.7 %G (ref 37–80)
HCT, POC: 42 % (ref 37.7–47.9)
Lymph, poc: 1.4 (ref 0.6–3.4)
MCHC: 31.9 g/dL (ref 31.8–35.4)
MCV: 94.7 fL (ref 80–97)
MID (cbc): 0.4 (ref 0–0.9)
MPV: 10.1 fL (ref 0–99.8)
POC Granulocyte: 4.7 (ref 2–6.9)
POC LYMPH PERCENT: 21.5 %L (ref 10–50)
POC MID %: 6.8 %M (ref 0–12)
Platelet Count, POC: 179 10*3/uL (ref 142–424)
RDW, POC: 14.7 %

## 2012-11-09 LAB — GLUCOSE, POCT (MANUAL RESULT ENTRY): POC Glucose: 195 mg/dl — AB (ref 70–99)

## 2012-11-09 NOTE — Progress Notes (Signed)
Subjective:    Patient ID: Marcia [name], female    DOB: 01-18-1935, 78 y.o.   MRN: [id number]  HPI  Marcia [name] is a pleasant 78 yr old female here for follow up on cellulitis of right lower leg.  Pt reports she is better able to walk on the leg today.  It is less tender.  Pt and husband both feel like it still looks very red.  Pt denies fever, chills.  One episode of vomiting yesterday on the way home from clinic.  Tolerating the doxy well.  Keflex and tramadol d/c'd by Dr. Perrin Maltese yesterday secondary to GI upset.  Pt with poorly controlled DM.  Husband reports sugars run 200s-500s.  Marcia [name] reports that Dr. Evlyn Kanner, pt's endocrinologist, has indicated he is ok with this.  Pt's husband administers all meds to pt.  When he leaves for work, he is never sure if she will eat enough.  He is constantly worried about finding her on the floor with a low blood sugar.  Yesterday in clinic pt's glucose 74, husband unaware if she had eaten that afternoon.  Husband reports glucose in low 200s this AM.  Review of Systems  Constitutional: Negative for fever and chills.  HENT: Negative.   Respiratory: Negative.   Cardiovascular: Negative.   Gastrointestinal: Negative.   Musculoskeletal: Positive for myalgias and arthralgias.  Skin: Positive for color change.  Neurological: Negative.        Objective:   Physical Exam  Vitals reviewed. Constitutional: She is oriented to person, place, and time. She appears well-developed and well-nourished. No distress.  HENT:  Head: Normocephalic and atraumatic.  Eyes: Conjunctivae are normal. No scleral icterus.  Pulmonary/Chest: Effort normal.  Neurological: She is alert and oriented to person, place, and time.  Skin: Skin is warm and dry. There is erythema.     Right lower leg continues to be erythematous and mildly swollen; erythema seems less intense today; erythema appears to be receding from initial outline 11/06/12; still with TTP but less so than yesterday    Psychiatric: She has a normal mood and affect. Her behavior is normal.     Results for orders placed in visit on 11/09/12  POCT CBC      Result Value Range   WBC 6.5  4.6 - 10.2 K/uL   Lymph, poc 1.4  0.6 - 3.4   POC LYMPH PERCENT 21.5  10 - 50 %L   MID (cbc) 0.4  0 - 0.9   POC MID % 6.8  0 - 12 %M   POC Granulocyte 4.7  2 - 6.9   Granulocyte percent 71.7  37 - 80 %G   RBC 4.43  4.04 - 5.48 M/uL   Hemoglobin 13.4  12.2 - 16.2 g/dL   HCT, POC 40.9  81.1 - 47.9 %   MCV 94.7  80 - 97 fL   MCH, POC 30.2  27 - 31.2 pg   MCHC 31.9  31.8 - 35.4 g/dL   RDW, POC 91.4     Platelet Count, POC 179  142 - 424 K/uL   MPV 10.1  0 - 99.8 fL  GLUCOSE, POCT (MANUAL RESULT ENTRY)      Result Value Range   POC Glucose 195 (*) 70 - 99 mg/dl        Assessment & Plan:  Cellulitis of leg, right - Plan: POCT CBC  -- Ms. Lamb is a very pleasant 78 yr old female here for follow up on cellulitis  of right leg.  Cellulitis appears to be improving - she is much less tender and erythema is receding from initial outline.  CBC is normal today.  Will continue doxy.  Will restart Keflex - if this causes significant GI distress, pt to call and we will reassess.  Pt will follow up with Dr. Dareen Piano is 48 hours.  RTC sooner if concerns.  Type 2 diabetes mellitus - Plan: POCT glucose (manual entry)  -- Discussed with pt and husband that poorly controlled blood sugars will likely hinder the healing process and leave her susceptible to repeat infections.  Pt's husband agrees that glycemic control is not ideal, but is unsure if they are able to do any better than this at this point.  Pt with multiple comorbidities including lung ca and dementia, so tight glycemic control may not necessarily be a priority in this case.  As pt is established with endo, will defer management of DM to Dr. Evlyn Kanner  Case discussed with Dr. Dareen Piano, who will see the patient for follow up in 48 hours

## 2012-11-11 ENCOUNTER — Ambulatory Visit (INDEPENDENT_AMBULATORY_CARE_PROVIDER_SITE_OTHER): Payer: Medicare Other | Admitting: Emergency Medicine

## 2012-11-11 VITALS — BP 120/54 | HR 76 | Temp 98.1°F | Resp 16 | Ht 65.5 in | Wt 149.0 lb

## 2012-11-11 DIAGNOSIS — L02419 Cutaneous abscess of limb, unspecified: Secondary | ICD-10-CM | POA: Diagnosis not present

## 2012-11-11 DIAGNOSIS — L03115 Cellulitis of right lower limb: Secondary | ICD-10-CM

## 2012-11-11 DIAGNOSIS — E119 Type 2 diabetes mellitus without complications: Secondary | ICD-10-CM

## 2012-11-11 MED ORDER — CLINDAMYCIN HCL 300 MG PO CAPS: 300.0000 mg | ORAL_CAPSULE | Freq: Three times a day (TID) | ORAL | Status: DC

## 2012-11-11 MED ORDER — CIPROFLOXACIN HCL 500 MG PO TABS: 500.0000 mg | ORAL_TABLET | Freq: Two times a day (BID) | ORAL | Status: DC

## 2012-11-11 NOTE — Patient Instructions (Addendum)
Stop doxycycline  Continue cephalexin  Follow up Sunday  Cellulitis Cellulitis is an infection of the skin and the tissue beneath it. The infected area is usually red and tender. Cellulitis occurs most often in the arms and lower legs.  CAUSES  Cellulitis is caused by bacteria that enter the skin through cracks or cuts in the skin. The most common types of bacteria that cause cellulitis are Staphylococcus and Streptococcus. SYMPTOMS   Redness and warmth.  Swelling.  Tenderness or pain.  Fever. DIAGNOSIS  Your caregiver can usually determine what is wrong based on a physical exam. Blood tests may also be done. TREATMENT  Treatment usually involves taking an antibiotic medicine. HOME CARE INSTRUCTIONS   Take your antibiotics as directed. Finish them even if you start to feel better.  Keep the infected arm or leg elevated to reduce swelling.  Apply a warm cloth to the affected area up to 4 times per day to relieve pain.  Only take over-the-counter or prescription medicines for pain, discomfort, or fever as directed by your caregiver.  Keep all follow-up appointments as directed by your caregiver. SEEK MEDICAL CARE IF:   You notice red streaks coming from the infected area.  Your red area gets larger or turns dark in color.  Your bone or joint underneath the infected area becomes painful after the skin has healed.  Your infection returns in the same area or another area.  You notice a swollen bump in the infected area.  You develop new symptoms. SEEK IMMEDIATE MEDICAL CARE IF:   You have a fever.  You feel very sleepy.  You develop vomiting or diarrhea.  You have a general ill feeling (malaise) with muscle aches and pains. MAKE SURE YOU:   Understand these instructions.  Will watch your condition.  Will get help right away if you are not doing well or get worse. Document Released: 11/12/2004 Document Revised: 08/04/2011 Document Reviewed:  04/20/2011 [facility] Patient Information 2014 [place], [place].

## 2012-11-11 NOTE — Progress Notes (Signed)
[facility] [address], [place] [address] [phone]  Date:  11/11/2012   Name:  Marcia [name]   DOB:  [birth year]   MRN:  [id number]  PCP:  Allean Found, MD    Chief Complaint: Follow-up   History of Present Illness:  Marcia [name] is a 78 y.o. very pleasant female patient who presents with the following:  Poorly controlled with IDDM FBS today in 300's.  Under treatment for cellulitis of the foot and continues to have pain in foot and ankle.  No visible wounds or drainage.  No fever or chills.  No improvement with over the counter medications or other home remedies. Denies other complaint or health concern today.   Patient Active Problem List   Diagnosis Date Noted  . Type II or unspecified type diabetes mellitus with unspecified complication, uncontrolled 06/14/2012  . Vascular dementia, uncomplicated 06/14/2012  . Malignant neoplasm of bronchus and lung, unspecified site 10/08/2011    Past Medical History  Diagnosis Date  . Diabetes mellitus   . Rheumatoid arthritis(714.0)   . Cancer   . Carcinoma     lung cell-stage1  . Colonic polyp   . Neuropathy     peripheral  . Goiter     with hypothyrodism  . Osteopenia   . Hyperlipemia   . Lymph node disorder 10R,11R    excision  . Memory loss     Past Surgical History  Procedure Laterality Date  . Cesarean section    . Lung surgery Right 2006    recurrance 2009,lower lobe superior segment,resection:2007  . Cataract extraction Bilateral   . Wrist surgery Right   . Foot surgery Right     x3  . Lung surgery Right     secondary lower lobe margin, excision,no tumor indentified    History  Substance Use Topics  . Smoking status: Former Smoker    Quit date: 01/16/2005  . Smokeless tobacco: Former Neurosurgeon    Quit date: 02/17/2004  . Alcohol Use: No    No family history on file.  Allergies  Allergen Reactions  . Amoxicillin Nausea And Vomiting  . Cefaclor Nausea And Vomiting  .  Hydrocodone Nausea And Vomiting  . Sulfa Antibiotics     " legs give out"    Medication list has been reviewed and updated.  Current Outpatient Prescriptions on File Prior to Visit  Medication Sig Dispense Refill  . aspirin 81 MG tablet Take 81 mg by mouth daily.       Marland Kitchen atorvastatin (LIPITOR) 40 MG tablet Take 40 mg by mouth daily.      . cephALEXin (KEFLEX) 500 MG capsule Take 1 capsule (500 mg total) by mouth 3 (three) times daily.  30 capsule  0  . donepezil (ARICEPT) 10 MG tablet Take 1 tablet (10 mg total) by mouth at bedtime. Patient needs to take medication daily at the same time.  30 tablet  11  . donepezil (ARICEPT) 10 MG tablet Take 1 tablet (10 mg total) by mouth at bedtime. Patient needs to take medication daily at the same time.  30 tablet  11  . doxycycline (VIBRAMYCIN) 100 MG capsule Take 1 capsule (100 mg total) by mouth 2 (two) times daily.  20 capsule  0  . ezetimibe (ZETIA) 10 MG tablet Take 10 mg by mouth daily.      Marland Kitchen HUMALOG MIX 75/25 KWIKPEN (75-25) 100 UNIT/ML SUSP 26 Units every morning. And take 16 units in  pm      . hydroxychloroquine (PLAQUENIL) 200 MG tablet Take 200 mg by mouth 2 (two) times daily.      Marland Kitchen omeprazole (PRILOSEC) 20 MG capsule Take 20 mg by mouth daily.      . predniSONE (DELTASONE) 5 MG tablet Take 5 mg by mouth daily.      . traMADol (ULTRAM) 50 MG tablet Take 1 tablet (50 mg total) by mouth every 8 (eight) hours as needed for pain.  30 tablet  0   Current Facility-Administered Medications on File Prior to Visit  Medication Dose Route Frequency Provider Last Rate Last Dose  . cefTRIAXone (ROCEPHIN) injection 1 g  1 g Intramuscular Q24H Sherren Mocha, MD   1 g at 11/08/12 1518    Review of Systems:  As per HPI, otherwise negative.    Physical Examination: Filed Vitals:   11/11/12 0956  BP: 120/54  Pulse: 76  Temp: 98.1 F (36.7 C)  Resp: 16   Filed Vitals:   11/11/12 0956  Height: 5' 5.5" (1.664 m)  Weight: 149 lb (67.586 kg)    Body mass index is 24.41 kg/(m^2). Ideal Body Weight: Weight in (lb) to have BMI = 25: 152.2   GEN: WDWN, NAD, Non-toxic, Alert & Oriented x 3 HEENT: Atraumatic, Normocephalic.  Ears and Nose: No external deformity. EXTR: No clubbing/cyanosis/edema NEURO: Normal gait.  PSYCH: Normally interactive. Conversant. Not depressed or anxious appearing.  Calm demeanor.  RIght foot:  Cellulitis with little improvement since last visit.     Assessment and Plan: Will add clindamycin and stop doxicycline Start cipro Follow up Sunday  Encouraged to tighten up her blood sugar control   Signed,  Phillips Odor, MD

## 2012-11-13 ENCOUNTER — Ambulatory Visit (INDEPENDENT_AMBULATORY_CARE_PROVIDER_SITE_OTHER): Payer: Medicare Other | Admitting: Emergency Medicine

## 2012-11-13 VITALS — BP 122/64 | HR 80 | Temp 98.8°F | Resp 18 | Ht 65.5 in | Wt 149.6 lb

## 2012-11-13 DIAGNOSIS — L03115 Cellulitis of right lower limb: Secondary | ICD-10-CM

## 2012-11-13 DIAGNOSIS — E119 Type 2 diabetes mellitus without complications: Secondary | ICD-10-CM | POA: Diagnosis not present

## 2012-11-13 DIAGNOSIS — L02419 Cutaneous abscess of limb, unspecified: Secondary | ICD-10-CM

## 2012-11-13 NOTE — Progress Notes (Signed)
[facility] [address], [place] [address] [phone]  Date:  11/13/2012   Name:  Marcia [name]   DOB:  [birth year]   MRN:  [id number]  PCP:  Allean Found, MD    Chief Complaint: Recurrent Skin Infections   History of Present Illness:  Marcia [name] is a 78 y.o. very pleasant female patient who presents with the following:  Follow up for cellulitis of the foot.  Had a change in antibiotics last visit.  Added clinda and cipro and stopped doxy.  Has improved in interval.  Less pain and swelling.  Less reddness.  No fever or chills.  No nausea or vomiting.  No stool change with antibiotics. Denies other complaint or health concern today.   Patient Active Problem List   Diagnosis Date Noted  . Type II or unspecified type diabetes mellitus with unspecified complication, uncontrolled 06/14/2012  . Vascular dementia, uncomplicated 06/14/2012  . Malignant neoplasm of bronchus and lung, unspecified site 10/08/2011    Past Medical History  Diagnosis Date  . Diabetes mellitus   . Rheumatoid arthritis(714.0)   . Cancer   . Carcinoma     lung cell-stage1  . Colonic polyp   . Neuropathy     peripheral  . Goiter     with hypothyrodism  . Osteopenia   . Hyperlipemia   . Lymph node disorder 10R,11R    excision  . Memory loss     Past Surgical History  Procedure Laterality Date  . Cesarean section    . Lung surgery Right 2006    recurrance 2009,lower lobe superior segment,resection:2007  . Cataract extraction Bilateral   . Wrist surgery Right   . Foot surgery Right     x3  . Lung surgery Right     secondary lower lobe margin, excision,no tumor indentified    History  Substance Use Topics  . Smoking status: Former Smoker    Quit date: 01/16/2005  . Smokeless tobacco: Former Neurosurgeon    Quit date: 02/17/2004  . Alcohol Use: No    History reviewed. No pertinent family history.  Allergies  Allergen Reactions  . Amoxicillin Nausea And  Vomiting  . Cefaclor Nausea And Vomiting  . Hydrocodone Nausea And Vomiting  . Sulfa Antibiotics     " legs give out"    Medication list has been reviewed and updated.  Current Outpatient Prescriptions on File Prior to Visit  Medication Sig Dispense Refill  . aspirin 81 MG tablet Take 81 mg by mouth daily.       Marland Kitchen atorvastatin (LIPITOR) 40 MG tablet Take 40 mg by mouth daily.      . cephALEXin (KEFLEX) 500 MG capsule Take 1 capsule (500 mg total) by mouth 3 (three) times daily.  30 capsule  0  . ciprofloxacin (CIPRO) 500 MG tablet Take 1 tablet (500 mg total) by mouth 2 (two) times daily.  20 tablet  0  . clindamycin (CLEOCIN) 300 MG capsule Take 1 capsule (300 mg total) by mouth 3 (three) times daily.  30 capsule  0  . donepezil (ARICEPT) 10 MG tablet Take 1 tablet (10 mg total) by mouth at bedtime. Patient needs to take medication daily at the same time.  30 tablet  11  . donepezil (ARICEPT) 10 MG tablet Take 1 tablet (10 mg total) by mouth at bedtime. Patient needs to take medication daily at the same time.  30 tablet  11  . doxycycline (VIBRAMYCIN) 100  MG capsule Take 1 capsule (100 mg total) by mouth 2 (two) times daily.  20 capsule  0  . ezetimibe (ZETIA) 10 MG tablet Take 10 mg by mouth daily.      Marland Kitchen HUMALOG MIX 75/25 KWIKPEN (75-25) 100 UNIT/ML SUSP 26 Units every morning. And take 16 units in pm      . hydroxychloroquine (PLAQUENIL) 200 MG tablet Take 200 mg by mouth 2 (two) times daily.      Marland Kitchen omeprazole (PRILOSEC) 20 MG capsule Take 20 mg by mouth daily.      . predniSONE (DELTASONE) 5 MG tablet Take 5 mg by mouth daily.      . traMADol (ULTRAM) 50 MG tablet Take 1 tablet (50 mg total) by mouth every 8 (eight) hours as needed for pain.  30 tablet  0   Current Facility-Administered Medications on File Prior to Visit  Medication Dose Route Frequency Provider Last Rate Last Dose  . cefTRIAXone (ROCEPHIN) injection 1 g  1 g Intramuscular Q24H Sherren Mocha, MD   1 g at 11/08/12 1518     Review of Systems:  As per HPI, otherwise negative.    Physical Examination: Filed Vitals:   11/13/12 1101  BP: 122/64  Pulse: 80  Temp: 98.8 F (37.1 C)  Resp: 18   Filed Vitals:   11/13/12 1101  Height: 5' 5.5" (1.664 m)  Weight: 149 lb 9.6 oz (67.858 kg)   Body mass index is 24.51 kg/(m^2). Ideal Body Weight: Weight in (lb) to have BMI = 25: 152.2   GEN: WDWN, NAD, Non-toxic, Alert & Oriented x 3 HEENT: Atraumatic, Normocephalic.  Ears and Nose: No external deformity. EXTR: No clubbing/cyanosis/edema NEURO: Normal gait.  PSYCH: Normally interactive. Conversant. Not depressed or anxious appearing.  Calm demeanor.  RIGHT foot:  Poor pulses.  Improved overall, less erythema, tenderness and swelling.    Assessment and Plan: Cellulitis Follow up in one week unless  worsens Signed,  Phillips Odor, MD

## 2012-11-13 NOTE — Patient Instructions (Addendum)
Cellulitis Cellulitis is an infection of the skin and the tissue beneath it. The infected area is usually red and tender. Cellulitis occurs most often in the arms and lower legs.  CAUSES  Cellulitis is caused by bacteria that enter the skin through cracks or cuts in the skin. The most common types of bacteria that cause cellulitis are Staphylococcus and Streptococcus. SYMPTOMS   Redness and warmth.  Swelling.  Tenderness or pain.  Fever. DIAGNOSIS  Your caregiver can usually determine what is wrong based on a physical exam. Blood tests may also be done. TREATMENT  Treatment usually involves taking an antibiotic medicine. HOME CARE INSTRUCTIONS   Take your antibiotics as directed. Finish them even if you start to feel better.  Keep the infected arm or leg elevated to reduce swelling.  Apply a warm cloth to the affected area up to 4 times per day to relieve pain.  Only take over-the-counter or prescription medicines for pain, discomfort, or fever as directed by your caregiver.  Keep all follow-up appointments as directed by your caregiver. SEEK MEDICAL CARE IF:   You notice red streaks coming from the infected area.  Your red area gets larger or turns dark in color.  Your bone or joint underneath the infected area becomes painful after the skin has healed.  Your infection returns in the same area or another area.  You notice a swollen bump in the infected area.  You develop new symptoms. SEEK IMMEDIATE MEDICAL CARE IF:   You have a fever.  You feel very sleepy.  You develop vomiting or diarrhea.  You have a general ill feeling (malaise) with muscle aches and pains. MAKE SURE YOU:   Understand these instructions.  Will watch your condition.  Will get help right away if you are not doing well or get worse. Document Released: 11/12/2004 Document Revised: 08/04/2011 Document Reviewed: 04/20/2011 [employer] Patient Information 2014 [employer].  

## 2012-11-15 DIAGNOSIS — E11339 Type 2 diabetes mellitus with moderate nonproliferative diabetic retinopathy without macular edema: Secondary | ICD-10-CM | POA: Diagnosis not present

## 2012-11-15 DIAGNOSIS — H35049 Retinal micro-aneurysms, unspecified, unspecified eye: Secondary | ICD-10-CM | POA: Diagnosis not present

## 2012-11-15 DIAGNOSIS — H43819 Vitreous degeneration, unspecified eye: Secondary | ICD-10-CM | POA: Diagnosis not present

## 2012-11-15 DIAGNOSIS — E1139 Type 2 diabetes mellitus with other diabetic ophthalmic complication: Secondary | ICD-10-CM | POA: Diagnosis not present

## 2012-11-16 ENCOUNTER — Other Ambulatory Visit: Payer: Self-pay | Admitting: Family Medicine

## 2012-11-17 ENCOUNTER — Ambulatory Visit (INDEPENDENT_AMBULATORY_CARE_PROVIDER_SITE_OTHER): Payer: Medicare Other | Admitting: Emergency Medicine

## 2012-11-17 ENCOUNTER — Telehealth: Payer: Self-pay

## 2012-11-17 ENCOUNTER — Telehealth: Payer: Self-pay | Admitting: Radiology

## 2012-11-17 VITALS — BP 132/52 | HR 82 | Temp 98.1°F | Resp 20 | Ht 65.5 in | Wt 146.8 lb

## 2012-11-17 DIAGNOSIS — R609 Edema, unspecified: Secondary | ICD-10-CM | POA: Diagnosis not present

## 2012-11-17 DIAGNOSIS — L03115 Cellulitis of right lower limb: Secondary | ICD-10-CM

## 2012-11-17 DIAGNOSIS — L02419 Cutaneous abscess of limb, unspecified: Secondary | ICD-10-CM | POA: Diagnosis not present

## 2012-11-17 MED ORDER — HYDROCHLOROTHIAZIDE 25 MG PO TABS: 25.0000 mg | ORAL_TABLET | Freq: Every day | ORAL | Status: DC

## 2012-11-17 NOTE — Telephone Encounter (Signed)
   Disp Refills Start End   clindamycin (CLEOCIN) 300 MG capsule 30 capsule 0 11/11/2012    Take 1 capsule (300 mg total) by mouth 3 (three) times daily. - Oral   ciprofloxacin (CIPRO) 500 MG tablet 20 tablet 0 11/11/2012    Take 1 tablet (500 mg total) by mouth 2 (two) times daily. - Oral      Keflex, Clindamycin and Cipro  Patient is out of keflex please advise if she need renewal of this, she is not improving much. She is not getting worse, plan is to see Dr Dareen Piano on Monday. Please advise.

## 2012-11-17 NOTE — Telephone Encounter (Signed)
Just take what she has.  She was instructed to stop the keflex previously

## 2012-11-17 NOTE — Telephone Encounter (Signed)
Patients husband called back, he is bringing her in today to see you. Advised him we can not fast track in front of people in the lobby, but when he gets to the back will advise Dr Dareen Piano, and try to expedite this for her.

## 2012-11-17 NOTE — Telephone Encounter (Signed)
Patient's husband is calling to check on a refill request for his wife. An antibiotic. States that it was Physicist, medical written by Dr. Clelia Croft however his wife has seen Dr. Dareen Piano during her last three visits.  [phone]

## 2012-11-17 NOTE — Progress Notes (Signed)
[facility] [address], [place] [address] [phone]  Date:  11/17/2012   Name:  Marcia [name]   DOB:  11-29-1934   MRN:  [id number]  PCP:  Allean Found, MD    Chief Complaint: wound care   History of Present Illness:  Marcia [name] is a 78 y.o. very pleasant female patient who presents with the following:  Being followed for cellulitis of the right leg.  Still having pain in ankle with some pitting edema but has improved significantly regarding the cellulitis.  No fever or chills.  Denies other complaint or health concern today.   Patient Active Problem List   Diagnosis Date Noted  . Type II or unspecified type diabetes mellitus with unspecified complication, uncontrolled 06/14/2012  . Vascular dementia, uncomplicated 06/14/2012  . Malignant neoplasm of bronchus and lung, unspecified site 10/08/2011    Past Medical History  Diagnosis Date  . Diabetes mellitus   . Rheumatoid arthritis(714.0)   . Cancer   . Carcinoma     lung cell-stage1  . Colonic polyp   . Neuropathy     peripheral  . Goiter     with hypothyrodism  . Osteopenia   . Hyperlipemia   . Lymph node disorder 10R,11R    excision  . Memory loss     Past Surgical History  Procedure Laterality Date  . Cesarean section    . Lung surgery Right 2006    recurrance 2009,lower lobe superior segment,resection:2007  . Cataract extraction Bilateral   . Wrist surgery Right   . Foot surgery Right     x3  . Lung surgery Right     secondary lower lobe margin, excision,no tumor indentified    History  Substance Use Topics  . Smoking status: Former Smoker    Quit date: 01/16/2005  . Smokeless tobacco: Former Neurosurgeon    Quit date: 02/17/2004  . Alcohol Use: No    History reviewed. No pertinent family history.  Allergies  Allergen Reactions  . Amoxicillin Nausea And Vomiting  . Cefaclor Nausea And Vomiting  . Hydrocodone Nausea And Vomiting  . Sulfa Antibiotics     " legs  give out"    Medication list has been reviewed and updated.  Current Outpatient Prescriptions on File Prior to Visit  Medication Sig Dispense Refill  . aspirin 81 MG tablet Take 81 mg by mouth daily.       Marland Kitchen atorvastatin (LIPITOR) 40 MG tablet Take 40 mg by mouth daily.      . ciprofloxacin (CIPRO) 500 MG tablet Take 1 tablet (500 mg total) by mouth 2 (two) times daily.  20 tablet  0  . clindamycin (CLEOCIN) 300 MG capsule Take 1 capsule (300 mg total) by mouth 3 (three) times daily.  30 capsule  0  . donepezil (ARICEPT) 10 MG tablet Take 1 tablet (10 mg total) by mouth at bedtime. Patient needs to take medication daily at the same time.  30 tablet  11  . donepezil (ARICEPT) 10 MG tablet Take 1 tablet (10 mg total) by mouth at bedtime. Patient needs to take medication daily at the same time.  30 tablet  11  . ezetimibe (ZETIA) 10 MG tablet Take 10 mg by mouth daily.      Marland Kitchen HUMALOG MIX 75/25 KWIKPEN (75-25) 100 UNIT/ML SUSP 26 Units every morning. And take 16 units in pm      . omeprazole (PRILOSEC) 20 MG capsule Take 20 mg  by mouth daily.      . predniSONE (DELTASONE) 5 MG tablet Take 5 mg by mouth daily.       Current Facility-Administered Medications on File Prior to Visit  Medication Dose Route Frequency Provider Last Rate Last Dose  . cefTRIAXone (ROCEPHIN) injection 1 g  1 g Intramuscular Q24H Sherren Mocha, MD   1 g at 11/08/12 1518    Review of Systems:  As per HPI, otherwise negative.    Physical Examination: Filed Vitals:   11/17/12 1548  BP: 132/52  Pulse: 82  Temp: 98.1 F (36.7 C)  Resp: 20   Filed Vitals:   11/17/12 1548  Height: 5' 5.5" (1.664 m)  Weight: 146 lb 12.8 oz (66.588 kg)   Body mass index is 24.05 kg/(m^2). Ideal Body Weight: Weight in (lb) to have BMI = 25: 152.2   GEN: WDWN, NAD, Non-toxic, Alert & Oriented x 3 HEENT: Atraumatic, Normocephalic.  Ears and Nose: No external deformity. EXTR: No clubbing/cyanosis  Has 1+ edema right ankle   Cellulitis markedly improved NEURO: Normal gait.  PSYCH: Normally interactive. Conversant. Not depressed or anxious appearing.  Calm demeanor.    Assessment and Plan: Improving cellulitis Edema HCTZ Follow up in one week  Signed,  Phillips Odor, MD

## 2012-11-17 NOTE — Telephone Encounter (Signed)
Called him, left message to advise.

## 2012-11-17 NOTE — Patient Instructions (Signed)
Edema Edema is an abnormal build-up of fluids in tissues. Because this is partly dependent on gravity (water flows to the lowest place), it is more common in the legs and thighs (lower extremities). It is also common in the looser tissues, like around the eyes. Painless swelling of the feet and ankles is common and increases as a person ages. It may affect both legs and may include the calves or even thighs. When squeezed, the fluid may move out of the affected area and may leave a dent for a few moments. CAUSES   Prolonged standing or sitting in one place for extended periods of time. Movement helps pump tissue fluid into the veins, and absence of movement prevents this, resulting in edema.  Varicose veins. The valves in the veins do not work as well as they should. This causes fluid to leak into the tissues.  Fluid and salt overload.  Injury, burn, or surgery to the leg, ankle, or foot, may damage veins and allow fluid to leak out.  Sunburn damages vessels. Leaky vessels allow fluid to go out into the sunburned tissues.  Allergies (from insect bites or stings, medications or chemicals) cause swelling by allowing vessels to become leaky.  Protein in the blood helps keep fluid in your vessels. Low protein, as in malnutrition, allows fluid to leak out.  Hormonal changes, including pregnancy and menstruation, cause fluid retention. This fluid may leak out of vessels and cause edema.  Medications that cause fluid retention. Examples are sex hormones, blood pressure medications, steroid treatment, or anti-depressants.  Some illnesses cause edema, especially heart failure, kidney disease, or liver disease.  Surgery that cuts veins or lymph nodes, such as surgery done for the heart or for breast cancer, may result in edema. DIAGNOSIS  Your caregiver is usually easily able to determine what is causing your swelling (edema) by simply asking what is wrong (getting a history) and examining you (doing  a physical). Sometimes x-rays, EKG (electrocardiogram or heart tracing), and blood work may be done to evaluate for underlying medical illness. TREATMENT  General treatment includes:  Leg elevation (or elevation of the affected body part).  Restriction of fluid intake.  Prevention of fluid overload.  Compression of the affected body part. Compression with elastic bandages or support stockings squeezes the tissues, preventing fluid from entering and forcing it back into the blood vessels.  Diuretics (also called water pills or fluid pills) pull fluid out of your body in the form of increased urination. These are effective in reducing the swelling, but can have side effects and must be used only under your caregiver's supervision. Diuretics are appropriate only for some types of edema. The specific treatment can be directed at any underlying causes discovered. Heart, liver, or kidney disease should be treated appropriately. HOME CARE INSTRUCTIONS   Elevate the legs (or affected body part) above the level of the heart, while lying down.  Avoid sitting or standing still for prolonged periods of time.  Avoid putting anything directly under the knees when lying down, and do not wear constricting clothing or garters on the upper legs.  Exercising the legs causes the fluid to work back into the veins and lymphatic channels. This may help the swelling go down.  The pressure applied by elastic bandages or support stockings can help reduce ankle swelling.  A low-salt diet may help reduce fluid retention and decrease the ankle swelling.  Take any medications exactly as prescribed. SEEK MEDICAL CARE IF:  Your edema is   not responding to recommended treatments. SEEK IMMEDIATE MEDICAL CARE IF:   You develop shortness of breath or chest pain.  You cannot breathe when you lay down; or if, while lying down, you have to get up and go to the window to get your breath.  You are having increasing  swelling without relief from treatment.  You develop a fever over 102 F (38.9 C).  You develop pain or redness in the areas that are swollen.  Tell your caregiver right away if you have gained 3 lb/1.4 kg in 1 day or 5 lb/2.3 kg in a week. MAKE SURE YOU:   Understand these instructions.  Will watch your condition.  Will get help right away if you are not doing well or get worse. Document Released: 02/02/2005 Document Revised: 08/04/2011 Document Reviewed: 09/21/2007 [facility] Patient Information 2014 [place], [place]. Cellulitis Cellulitis is an infection of the skin and the tissue beneath it. The infected area is usually red and tender. Cellulitis occurs most often in the arms and lower legs.  CAUSES  Cellulitis is caused by bacteria that enter the skin through cracks or cuts in the skin. The most common types of bacteria that cause cellulitis are Staphylococcus and Streptococcus. SYMPTOMS   Redness and warmth.  Swelling.  Tenderness or pain.  Fever. DIAGNOSIS  Your caregiver can usually determine what is wrong based on a physical exam. Blood tests may also be done. TREATMENT  Treatment usually involves taking an antibiotic medicine. HOME CARE INSTRUCTIONS   Take your antibiotics as directed. Finish them even if you start to feel better.  Keep the infected arm or leg elevated to reduce swelling.  Apply a warm cloth to the affected area up to 4 times per day to relieve pain.  Only take over-the-counter or prescription medicines for pain, discomfort, or fever as directed by your caregiver.  Keep all follow-up appointments as directed by your caregiver. SEEK MEDICAL CARE IF:   You notice red streaks coming from the infected area.  Your red area gets larger or turns dark in color.  Your bone or joint underneath the infected area becomes painful after the skin has healed.  Your infection returns in the same area or another area.  You notice a swollen bump in the  infected area.  You develop new symptoms. SEEK IMMEDIATE MEDICAL CARE IF:   You have a fever.  You feel very sleepy.  You develop vomiting or diarrhea.  You have a general ill feeling (malaise) with muscle aches and pains. MAKE SURE YOU:   Understand these instructions.  Will watch your condition.  Will get help right away if you are not doing well or get worse. Document Released: 11/12/2004 Document Revised: 08/04/2011 Document Reviewed: 04/20/2011 [facility] Patient Information 2014 [place], [place].

## 2012-11-24 ENCOUNTER — Ambulatory Visit (INDEPENDENT_AMBULATORY_CARE_PROVIDER_SITE_OTHER): Payer: Medicare Other | Admitting: Emergency Medicine

## 2012-11-24 VITALS — BP 120/60 | HR 73 | Temp 98.1°F

## 2012-11-24 DIAGNOSIS — L03115 Cellulitis of right lower limb: Secondary | ICD-10-CM

## 2012-11-24 DIAGNOSIS — L02419 Cutaneous abscess of limb, unspecified: Secondary | ICD-10-CM

## 2012-11-24 NOTE — Progress Notes (Signed)
[facility] [address] [place] [address] [phone]  Date:  11/24/2012   Name:  Marcia [name]   DOB:  [birth year]   MRN:  [id number]  PCP:  Allean Found, MD    Chief Complaint: Wound Check   History of Present Illness:  Marcia [name] is a 78 y.o. very pleasant female patient who presents with the following:  Follow up for cellulitis and edema of the right leg.  Has interval improvement.  No fever or chills.  Walks daily.  No improvement with over the counter medications or other home remedies. Denies other complaint or health concern today.   Patient Active Problem List   Diagnosis Date Noted  . Type II or unspecified type diabetes mellitus with unspecified complication, uncontrolled 06/14/2012  . Vascular dementia, uncomplicated 06/14/2012  . Malignant neoplasm of bronchus and lung, unspecified site 10/08/2011    Past Medical History  Diagnosis Date  . Diabetes mellitus   . Rheumatoid arthritis(714.0)   . Cancer   . Carcinoma     lung cell-stage1  . Colonic polyp   . Neuropathy     peripheral  . Goiter     with hypothyrodism  . Osteopenia   . Hyperlipemia   . Lymph node disorder 10R,11R    excision  . Memory loss     Past Surgical History  Procedure Laterality Date  . Cesarean section    . Lung surgery Right 2006    recurrance 2009,lower lobe superior segment,resection:2007  . Cataract extraction Bilateral   . Wrist surgery Right   . Foot surgery Right     x3  . Lung surgery Right     secondary lower lobe margin, excision,no tumor indentified    History  Substance Use Topics  . Smoking status: Former Smoker    Quit date: 01/16/2005  . Smokeless tobacco: Former Neurosurgeon    Quit date: 02/17/2004  . Alcohol Use: No    No family history on file.  Allergies  Allergen Reactions  . Amoxicillin Nausea And Vomiting  . Cefaclor Nausea And Vomiting  . Hydrocodone Nausea And Vomiting  . Sulfa Antibiotics     " legs give  out"    Medication list has been reviewed and updated.  Current Outpatient Prescriptions on File Prior to Visit  Medication Sig Dispense Refill  . aspirin 81 MG tablet Take 81 mg by mouth daily.       Marland Kitchen atorvastatin (LIPITOR) 40 MG tablet Take 40 mg by mouth daily.      . ciprofloxacin (CIPRO) 500 MG tablet Take 1 tablet (500 mg total) by mouth 2 (two) times daily.  20 tablet  0  . clindamycin (CLEOCIN) 300 MG capsule Take 1 capsule (300 mg total) by mouth 3 (three) times daily.  30 capsule  0  . donepezil (ARICEPT) 10 MG tablet Take 1 tablet (10 mg total) by mouth at bedtime. Patient needs to take medication daily at the same time.  30 tablet  11  . donepezil (ARICEPT) 10 MG tablet Take 1 tablet (10 mg total) by mouth at bedtime. Patient needs to take medication daily at the same time.  30 tablet  11  . ezetimibe (ZETIA) 10 MG tablet Take 10 mg by mouth daily.      Marland Kitchen HUMALOG MIX 75/25 KWIKPEN (75-25) 100 UNIT/ML SUSP 26 Units every morning. And take 16 units in pm      . hydrochlorothiazide (HYDRODIURIL) 25 MG tablet Take  1 tablet (25 mg total) by mouth daily.  30 tablet  0  . predniSONE (DELTASONE) 5 MG tablet Take 5 mg by mouth daily.      Marland Kitchen omeprazole (PRILOSEC) 20 MG capsule Take 20 mg by mouth daily.       Current Facility-Administered Medications on File Prior to Visit  Medication Dose Route Frequency Provider Last Rate Last Dose  . cefTRIAXone (ROCEPHIN) injection 1 g  1 g Intramuscular Q24H Sherren Mocha, MD   1 g at 11/08/12 1518    Review of Systems:  As per HPI, otherwise negative.    Physical Examination: Filed Vitals:   11/24/12 1629  BP: 120/60  Pulse: 73  Temp: 98.1 F (36.7 C)   There were no vitals filed for this visit. There is no weight on file to calculate BMI. Ideal Body Weight:     GEN: WDWN, NAD, Non-toxic, Alert & Oriented x 3 HEENT: Atraumatic, Normocephalic.  Ears and Nose: No external deformity. EXTR: No clubbing/cyanosis/edema NEURO: Normal  gait.  PSYCH: Normally interactive. Conversant. Not depressed or anxious appearing.  Calm demeanor.  RIGHT leg  Improved. Not tender, no edema some discoloration  Assessment and Plan: Cellulitis right leg resolving   Signed,  Phillips Odor, MD

## 2012-12-08 DIAGNOSIS — L02419 Cutaneous abscess of limb, unspecified: Secondary | ICD-10-CM | POA: Diagnosis not present

## 2012-12-08 DIAGNOSIS — L97909 Non-pressure chronic ulcer of unspecified part of unspecified lower leg with unspecified severity: Secondary | ICD-10-CM | POA: Diagnosis not present

## 2012-12-12 DIAGNOSIS — L02419 Cutaneous abscess of limb, unspecified: Secondary | ICD-10-CM | POA: Diagnosis not present

## 2012-12-14 ENCOUNTER — Ambulatory Visit (INDEPENDENT_AMBULATORY_CARE_PROVIDER_SITE_OTHER): Payer: Medicare Other | Admitting: Neurology

## 2012-12-14 ENCOUNTER — Ambulatory Visit (INDEPENDENT_AMBULATORY_CARE_PROVIDER_SITE_OTHER): Payer: Medicare Other | Admitting: Family Medicine

## 2012-12-14 ENCOUNTER — Encounter: Payer: Self-pay | Admitting: Neurology

## 2012-12-14 ENCOUNTER — Encounter (INDEPENDENT_AMBULATORY_CARE_PROVIDER_SITE_OTHER): Payer: Self-pay

## 2012-12-14 VITALS — BP 125/65 | HR 80 | Resp 16 | Ht 65.0 in | Wt 156.0 lb

## 2012-12-14 VITALS — BP 158/62 | HR 81 | Temp 98.6°F | Resp 18 | Wt 156.0 lb

## 2012-12-14 DIAGNOSIS — F0391 Unspecified dementia with behavioral disturbance: Secondary | ICD-10-CM

## 2012-12-14 DIAGNOSIS — M79609 Pain in unspecified limb: Secondary | ICD-10-CM

## 2012-12-14 DIAGNOSIS — M79605 Pain in left leg: Secondary | ICD-10-CM

## 2012-12-14 DIAGNOSIS — L02419 Cutaneous abscess of limb, unspecified: Secondary | ICD-10-CM

## 2012-12-14 DIAGNOSIS — L03116 Cellulitis of left lower limb: Secondary | ICD-10-CM

## 2012-12-14 DIAGNOSIS — IMO0001 Reserved for inherently not codable concepts without codable children: Secondary | ICD-10-CM | POA: Diagnosis not present

## 2012-12-14 LAB — POCT CBC
HCT, POC: 43.7 % (ref 37.7–47.9)
Hemoglobin: 13.9 g/dL (ref 12.2–16.2)
Lymph, poc: 1.4 (ref 0.6–3.4)
MCHC: 31.8 g/dL (ref 31.8–35.4)
MCV: 94.5 fL (ref 80–97)
POC Granulocyte: 5.7 (ref 2–6.9)
POC LYMPH PERCENT: 17.6 %L (ref 10–50)
RBC: 4.62 M/uL (ref 4.04–5.48)
RDW, POC: 15.3 %
WBC: 7.7 10*3/uL (ref 4.6–10.2)

## 2012-12-14 LAB — GLUCOSE, POCT (MANUAL RESULT ENTRY): POC Glucose: 204 mg/dl — AB (ref 70–99)

## 2012-12-14 MED ORDER — LEVOFLOXACIN 500 MG PO TABS: 500.0000 mg | ORAL_TABLET | Freq: Every day | ORAL | Status: DC

## 2012-12-14 MED ORDER — CEFTRIAXONE SODIUM 1 G IJ SOLR: 1.0000 g | INTRAMUSCULAR | Status: DC

## 2012-12-14 MED ORDER — DONEPEZIL HCL 10 MG PO TABS: 10.0000 mg | ORAL_TABLET | Freq: Every day | ORAL | Status: DC

## 2012-12-14 MED ORDER — DOXYCYCLINE HYCLATE 100 MG PO CAPS: 100.0000 mg | ORAL_CAPSULE | Freq: Two times a day (BID) | ORAL | Status: DC

## 2012-12-14 MED ORDER — CEFTRIAXONE SODIUM 1 G IJ SOLR
1.0000 g | Freq: Once | INTRAMUSCULAR | Status: AC
  Administered 2012-12-14: 1 g via INTRAMUSCULAR

## 2012-12-14 NOTE — Patient Instructions (Signed)
Dementia Dementia is a general term for problems with brain function. A person with dementia has memory loss and a hard time with at least one other brain function such as thinking, speaking, or problem solving. Dementia can affect social functioning, how you do your job, your mood, or your personality. The changes may be hidden for a long time. The earliest forms of this disease are usually not detected by family or friends. Dementia can be:  Irreversible.  Potentially reversible.  Partially reversible.  Progressive. This means it can get worse over time. CAUSES  Irreversible dementia causes may include:  Degeneration of brain cells (Alzheimer's disease or lewy body dementia).  Multiple small strokes (vascular dementia).  Infection (chronic meningitis or Creutzfelt-Jakob disease).  Frontotemporal dementia. This affects younger people, age 40 to 70, compared to those who have Alzheimer's disease.  Dementia associated with other disorders like Parkinson's disease, Huntington's disease, or HIV-associated dementia. Potentially or partially reversible dementia causes may include:  Medicines.  Metabolic causes such as excessive alcohol intake, vitamin B12 deficiency, or thyroid disease.  Masses or pressure in the brain such as a tumor, blood clot, or hydrocephalus. SYMPTOMS  Symptoms are often hard to detect. Family members or coworkers may not notice them early in the disease process. Different people with dementia may have different symptoms. Symptoms can include:  A hard time with memory, especially recent memory. Long-term memory may not be impaired.  Asking the same question multiple times or forgetting something someone just said.  A hard time speaking your thoughts or finding certain words.  A hard time solving problems or performing familiar tasks (such as how to use a telephone).  Sudden changes in mood.  Changes in personality, especially increasing moodiness or  mistrust.  Depression.  A hard time understanding complex ideas that were never a problem in the past. DIAGNOSIS  There are no specific tests for dementia.   Your caregiver may recommend a thorough evaluation. This is because some forms of dementia can be reversible. The evaluation will likely include a physical exam and getting a detailed history from you and a family member. The history often gives the best clues and suggestions for a diagnosis.  Memory testing may be done. A detailed brain function evaluation called neuropsychologic testing may be helpful.  Lab tests and brain imaging (such as a CT scan or MRI scan) are sometimes important.  Sometimes observation and re-evaluation over time is very helpful. TREATMENT  Treatment depends on the cause.   If the problem is a vitamin deficiency, it may be helped or cured with supplements.  For dementias such as Alzheimer's disease, medicines are available to stabilize or slow the course of the disease. There are no cures for this type of dementia.  Your caregiver can help direct you to groups, organizations, and other caregivers to help with decisions in the care of you or your loved one. HOME CARE INSTRUCTIONS The care of individuals with dementia is varied and dependent upon the progression of the dementia. The following suggestions are intended for the person living with, or caring for, the person with dementia.  Create a safe environment.  Remove the locks on bathroom doors to prevent the person from accidentally locking himself or herself in.  Use childproof latches on kitchen cabinets and any place where cleaning supplies, chemicals, or alcohol are kept.  Use childproof covers in unused electrical outlets.  Install childproof devices to keep doors and windows secured.  Remove stove knobs or install safety   knobs and an automatic shut-off on the stove.  Lower the temperature on water heaters.  Label medicines and keep them  locked up.  Secure knives, lighters, matches, power tools, and guns, and keep these items out of reach.  Keep the house free from clutter. Remove rugs or anything that might contribute to a fall.  Remove objects that might break and hurt the person.  Make sure lighting is good, both inside and outside.  Install grab rails as needed.  Use a monitoring device to alert you to falls or other needs for help.  Reduce confusion.  Keep familiar objects and people around.  Use night lights or dim lights at night.  Label items or areas.  Use reminders, notes, or directions for daily activities or tasks.  Keep a simple, consistent routine for waking, meals, bathing, dressing, and bedtime.  Create a calm, quiet environment.  Place large clocks and calendars prominently.  Display emergency numbers and home address near all telephones.  Use cues to establish different times of the day. An example is to open curtains to let the natural light in during the day.   Use effective communication.  Choose simple words and short sentences.  Use a gentle, calm tone of voice.  Be careful not to interrupt.  If the person is struggling to find a word or communicate a thought, try to provide the word or thought.  Ask one question at a time. Allow the person ample time to answer questions. Repeat the question again if the person does not respond.  Reduce nighttime restlessness.  Provide a comfortable bed.  Have a consistent nighttime routine.  Ensure a regular walking or physical activity schedule. Involve the person in daily activities as much as possible.  Limit napping during the day.  Limit caffeine.  Attend social events that stimulate rather than overwhelm the senses.  Encourage good nutrition and hydration.  Reduce distractions during meal times and snacks.  Avoid foods that are too hot or too cold.  Monitor chewing and swallowing ability.  Continue with routine vision,  hearing, dental, and medical screenings.  Only give over-the-counter or prescription medicines as directed by the caregiver.  Monitor driving abilities. Do not allow the person to drive when safe driving is no longer possible.  Register with an identification program which could provide location assistance in the event of a missing person situation. SEEK MEDICAL CARE IF:   New behavioral problems start such as moodiness, aggressiveness, or seeing things that are not there (hallucinations).  Any new problem with brain function happens. This includes problems with balance, speech, or falling a lot.  Problems with swallowing develop.  Any symptoms of other illness happen. Small changes or worsening in any aspect of brain function can be a sign that the illness is getting worse. It can also be a sign of another medical illness such as infection. Seeing a caregiver right away is important. SEEK IMMEDIATE MEDICAL CARE IF:   A fever develops.  New or worsened confusion develops.  New or worsened sleepiness develops.  Staying awake becomes hard to do. Document Released: 07/29/2000 Document Revised: 04/27/2011 Document Reviewed: 06/30/2010 [employer] Patient Information 2014 [employer].  

## 2012-12-14 NOTE — Progress Notes (Signed)
[facility]  Provider:  Dr Marcia [name] Referring Provider:  Primary Care Physician:  Marcia Found, MD  Chief Complaint  Patient presents with  . 6 mo f/u    Memory issues/ MOCA was given today at visit    HPI:  Marcia [name] is a 78 y.o. female here as a referral from Dr. Katrinka [name] for memory loss.   [name] is seen today in a 6 month revisit she had recently been with her husband at the beach apparently she injured her leg the skin of her left lower extremity about 14 days ago during the time at the beach the skin became more reddened and inflamed she now has a wet ulcer there is some scar tissue formation but it is concerning that her skin is very red and shiny and sore.. A culture of the wound was taken by Dr. Katrinka [name] office and the results have not yet been available to [place]. She has been prophylactically started on cephalexin. She feels cold all the time she may have some chills but she does not measurably have a feverish infection. She also feels no pain related to her leg ulcer  unless touched.  MMSE : 19 out of 30 , MOCA was 17-30 points. Clearly progressive dementia.     History:  The patient was evaluated by spinal tap for a possible NPH, but showed no improvement after the procedure. See 2012 notes.  At that time her husband , an Airline pilot , had to be in hospital and her diabetes became uncontrolled . The patient has been followed for over 40 years by Dr. Evlyn [name] for her diabetes mellitus.She had originally presented at 78 years of age was confusional spells were possibly attributable to a variable blood glucose levels. Per M.M.S.E test,  In November 2013 - 20 points. Since that date , all medication is handled by her husband . 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  Review of Systems: Out of a complete 14 system review, the patient complains of only the following symptoms, and all other reviewed systems are  negative.  Memory loss, MMSE today at 19 points AFT only 15  points.  "no goofy spells over the last 12 month ' blood sugar is better controlled since her husband gives her the medication.  She cooks still some, not driving.    History   Social History  . Marital Status: Married    Spouse Name: N/A    Number of Children: 3  . Years of Education: College   Occupational History  . Not on file.   Social History Main Topics  . Smoking status: Former Smoker    Quit date: 01/16/2005  . Smokeless tobacco: Former Neurosurgeon    Quit date: 02/17/2004  . Alcohol Use: No  . Drug Use: No  . Sexual Activity: Yes    Birth Control/ Protection: None   Other Topics Concern  . Not on file   Social History Narrative   Pt lives at home with her spouse.   Caffeine Use-2 cups daily    History reviewed. No pertinent family history.  Past Medical History  Diagnosis Date  . Diabetes mellitus   . Rheumatoid arthritis(714.0)   . Cancer   . Carcinoma     lung cell-stage1  . Colonic polyp   . Neuropathy     peripheral  . Goiter     with hypothyrodism  . Osteopenia   . Hyperlipemia   .  Lymph node disorder 10R,11R    excision  . Memory loss     Past Surgical History  Procedure Laterality Date  . Cesarean section    . Lung surgery Right 2006    recurrance 2009,lower lobe superior segment,resection:2007  . Cataract extraction Bilateral   . Wrist surgery Right   . Foot surgery Right     x3  . Lung surgery Right     secondary lower lobe margin, excision,no tumor indentified      Allergies as of 12/14/2012 - Review Complete 12/14/2012  Allergen Reaction Noted  . Amoxicillin Nausea And Vomiting 03/09/2011  . Cefaclor Nausea And Vomiting 03/09/2011  . Hydrocodone Nausea And Vomiting 03/09/2011  . Sulfa antibiotics  03/09/2011    Vitals: BP 125/65  Pulse 80  Resp 16  Ht 5\' 5"  (1.651 m)  Wt 156 lb (70.761 kg)  BMI 25.96 kg/m2 Last Weight:  Wt Readings from Last 1 Encounters:   12/14/12 156 lb (70.761 kg)   Last Height:   Ht Readings from Last 1 Encounters:  12/14/12 5\' 5"  (1.651 m)    Physical exam:  General: The patient is awake, alert and appears not in acute distress. The patient is well groomed. Head: Normocephalic, atraumatic. Neck is supple.  Cardiovascular:  Regular rate and rhythm, without  murmurs or carotid bruit, and without distended neck veins. Respiratory: Lungs are clear to auscultation. Skin:  Without evidence of edema, or rash Trunk: BMI is normal and patient  has normal posture.  Neurologic exam : The patient is awake and alert, oriented to place and time.  Memory subjective  described as declined . MMSE 19 points a out of 30/  There is abnormal attention span & concentration ability.  Speech is fluent with mild dysarthria, not  dysphonia  Not ,aphasia.  Mood and affect are appropriate.  Cranial nerves: Pupils are sluggishly reactive to light. Funduscopic exam status post cataract(  2011 )  evidence of pallor or edema.  Extraocular movements  in vertical and horizontal planes intact and without nystagmus. Visual fields by finger perimetry are intact. Hearing to finger rub intact.  Facial sensation intact to fine touch. Facial motor strength is symmetric and tongue and uvula move midline.  Motor exam:   Normal tone and normal muscle bulk and symmetric normal strength in all extremities.  Sensory:  Fine touch, pinprick and vibration were tested in all extremities. Proprioception is tested in the upper extremities only.  Coordination: Rapid alternating movements in the fingers/hands is tested and normal. Finger-to-nose maneuver tested and normal without evidence of ataxia, dysmetria or tremor.  Gait and station: Patient walks without assistive device and is able and assisted stool climb up to the exam table. Strength within normal limits. Stance is stable and normal.  Tandem gait is significantly impaired  ,  turns with 5 Steps- turs are   are   Slightly fragmented. Romberg testing is  Normal. Fall risk at 10 points.   Deep tendon reflexes:  in the  upper and lower extremities are symmetric, patella and achilles reflexes are attenuated.  She lost one toe. Babinski maneuver response is downgoing.   Assessment:  Dementia; microvascular with  Exacerbation from DM .  After having better control of the blood glucose she has been declining much slower.   physical and neurologic examination, review of laboratory studies, imaging,  and pre-existing records, assessment is that of progressive dementia without behavior factors.  Short term meory loss. .  Plan:  Treatment plan  and additional workup will be reviewed under Problem List. Continue  Aricept- .

## 2012-12-14 NOTE — Progress Notes (Signed)
Subjective:    Patient ID: Marcia [name], female    DOB: 03/04/1934, 78 y.o.   MRN: [id number]  This chart was scribed for Ethelda Chick, MD by Greggory Stallion, Medical Scribe. This patient's care was started at 6:21 PM.  HPI HPI Comments: Marcia [name] is a 78 y.o. female with h/o diabetes who presents to the office complaining of an injury to her left lower leg that occurred 2 weeks ago when she hit it on a chair at home. Her husband states when they went to the beach last week, her leg started getting very red and the wound began draining a lot. She states it does not hurt but is sometimes tender to palpation. They went to the doctor at the beach 6 days ago and the pt was given clindamycin 4 times per day and is still taking it with mild relief of the drainage. Pt has not elevated her leg today. She denies diarrhea. Her blood sugar was 235 today but her husband states it normally runs from 200-400 with her daily medications.  Redness has extended outside of purple line slightly; also new drainage from wound today.  Patient Active Problem List   Diagnosis Date Noted  . Type II or unspecified type diabetes mellitus with unspecified complication, uncontrolled 06/14/2012  . Vascular dementia, uncomplicated 06/14/2012  . Malignant neoplasm of bronchus and lung, unspecified site 10/08/2011   Past Medical History  Diagnosis Date  . Diabetes mellitus   . Rheumatoid arthritis(714.0)   . Cancer   . Carcinoma     lung cell-stage1  . Colonic polyp   . Neuropathy     peripheral  . Goiter     with hypothyrodism  . Osteopenia   . Hyperlipemia   . Lymph node disorder 10R,11R    excision  . Memory loss    Past Surgical History  Procedure Laterality Date  . Cesarean section    . Lung surgery Right 2006    recurrance 2009,lower lobe superior segment,resection:2007  . Cataract extraction Bilateral   . Wrist surgery Right   . Foot surgery Right     x3  . Lung surgery Right     secondary lower  lobe margin, excision,no tumor indentified   Allergies  Allergen Reactions  . Amoxicillin Nausea And Vomiting  . Cefaclor Nausea And Vomiting  . Hydrocodone Nausea And Vomiting  . Sulfa Antibiotics     " legs give out"   Prior to Admission medications   Medication Sig Start Date End Date Taking? Authorizing Provider  aspirin 81 MG tablet Take 81 mg by mouth daily.    Yes Historical Provider, MD  atorvastatin (LIPITOR) 40 MG tablet Take 40 mg by mouth daily. 03/25/12  Yes Historical Provider, MD  Calcium Carbonate-Vitamin D (CALCIUM-VITAMIN D) 500-200 MG-UNIT per tablet Take 1 tablet by mouth 2 (two) times daily with a meal.   Yes Historical Provider, MD  clindamycin (CLEOCIN) 150 MG capsule Take 150 mg by mouth 4 (four) times daily.   Yes Historical Provider, MD  donepezil (ARICEPT) 10 MG tablet Take 1 tablet (10 mg total) by mouth at bedtime. 12/14/12  Yes Carmen Dohmeier, MD  ezetimibe (ZETIA) 10 MG tablet Take 10 mg by mouth daily.   Yes Historical Provider, MD  HUMALOG MIX 75/25 KWIKPEN (75-25) 100 UNIT/ML SUSP 26 Units every morning. And take 16 units in pm 04/02/12  Yes Historical Provider, MD  predniSONE (DELTASONE) 5 MG tablet Take 5 mg by mouth daily.  Yes Historical Provider, MD  traZODone (DESYREL) 100 MG tablet  11/04/12  Yes Historical Provider, MD   History   Social History  . Marital Status: Married    Spouse Name: N/A    Number of Children: 3  . Years of Education: College   Occupational History  . Not on file.   Social History Main Topics  . Smoking status: Former Smoker    Quit date: 01/16/2005  . Smokeless tobacco: Former Neurosurgeon    Quit date: 02/17/2004  . Alcohol Use: No  . Drug Use: No  . Sexual Activity: Yes    Birth Control/ Protection: None   Other Topics Concern  . Not on file   Social History Narrative   Pt lives at home with her spouse.   Caffeine Use-2 cups daily     Review of Systems  Constitutional: Negative for fever, chills, diaphoresis  and fatigue.  Gastrointestinal: Negative for diarrhea.  Musculoskeletal: Negative for arthralgias.  Skin: Positive for wound.       Objective:   Physical Exam  Constitutional: She is oriented to person, place, and time. She appears well-developed and well-nourished. No distress.  HENT:  Head: Normocephalic and atraumatic.  Eyes: EOM are normal.  Neck: Neck supple. No tracheal deviation present.  Cardiovascular: Normal rate.   Dorsalis pedis pulse 2+ in left lower extremity.   Pulmonary/Chest: Effort normal. No respiratory distress.  Musculoskeletal:  Left lower extremity with diffuse erythema and swelling from proximal tibia to ankle.   Neurological: She is alert and oriented to person, place, and time.  Skin: Skin is warm and dry.  2 cm eschar left anterior shin with scan drainage along lateral edge without fluctuance. Erythema extends to posterior calf.   Psychiatric: She has a normal mood and affect. Her behavior is normal.    Filed Vitals:   12/14/12 1712  BP: 158/62  Pulse: 81  Temp: 98.6 F (37 C)  TempSrc: Oral  Resp: 18  Weight: 156 lb (70.761 kg)  SpO2: 100%   Results for orders placed in visit on 12/14/12  POCT CBC      Result Value Range   WBC 7.7  4.6 - 10.2 K/uL   Lymph, poc 1.4  0.6 - 3.4   POC LYMPH PERCENT 17.6  10 - 50 %L   MID (cbc) 0.6  0 - 0.9   POC MID % 8.0  0 - 12 %M   POC Granulocyte 5.7  2 - 6.9   Granulocyte percent 74.4  37 - 80 %G   RBC 4.62  4.04 - 5.48 M/uL   Hemoglobin 13.9  12.2 - 16.2 g/dL   HCT, POC 16.1  09.6 - 47.9 %   MCV 94.5  80 - 97 fL   MCH, POC 30.1  27 - 31.2 pg   MCHC 31.8  31.8 - 35.4 g/dL   RDW, POC 04.5     Platelet Count, POC 182  142 - 424 K/uL   MPV 11.1  0 - 99.8 fL  GLUCOSE, POCT (MANUAL RESULT ENTRY)      Result Value Range   POC Glucose 204 (*) 70 - 99 mg/dl   ROCEPHIN 1 GRAM IM ADMINISTERED.    Assessment & Plan:  Leg pain, left - Plan: POCT CBC, POCT glucose (manual entry), Wound culture,  DISCONTINUED: cefTRIAXone (ROCEPHIN) injection 1 g  Cellulitis of leg, left - Plan: POCT CBC, POCT glucose (manual entry), Wound culture, cefTRIAXone (ROCEPHIN) injection 1 g, DISCONTINUED: cefTRIAXone (ROCEPHIN)  injection 1 g  Type II or unspecified type diabetes mellitus without mention of complication, uncontrolled - Plan: POCT CBC, POCT glucose (manual entry), Wound culture, DISCONTINUED: cefTRIAXone (ROCEPHIN) injection 1 g  1. L Leg pain: New. Secondary to cellulitis. 2.  L leg cellulitis:  New.  S/p Rocephin 1 gram IM; stop Clindamycin; rx for Levaquin and Doxycycline provided.  Follow-up in 24 hours for recheck with Dr. Katrinka Blazing. 3. DMII: uncontrolled; monitor closely with acute infection.    Meds ordered this encounter  Medications  . Calcium Carbonate-Vitamin D (CALCIUM-VITAMIN D) 500-200 MG-UNIT per tablet    Sig: Take 1 tablet by mouth 2 (two) times daily with a meal.  . clindamycin (CLEOCIN) 150 MG capsule    Sig: Take 150 mg by mouth 4 (four) times daily.  Marland Kitchen DISCONTD: cefTRIAXone (ROCEPHIN) injection 1 g    Sig:   . cefTRIAXone (ROCEPHIN) injection 1 g    Sig:   . levofloxacin (LEVAQUIN) 500 MG tablet    Sig: Take 1 tablet (500 mg total) by mouth daily.    Dispense:  10 tablet    Refill:  0  . doxycycline (VIBRAMYCIN) 100 MG capsule    Sig: Take 1 capsule (100 mg total) by mouth 2 (two) times daily.    Dispense:  20 capsule    Refill:  0    I personally performed the services described in this documentation, which was scribed in my presence.  The recorded information has been reviewed and is accurate.

## 2012-12-15 ENCOUNTER — Ambulatory Visit (INDEPENDENT_AMBULATORY_CARE_PROVIDER_SITE_OTHER): Payer: Medicare Other | Admitting: Family Medicine

## 2012-12-15 ENCOUNTER — Other Ambulatory Visit: Payer: Self-pay

## 2012-12-15 VITALS — BP 114/64 | HR 78 | Temp 98.6°F | Resp 16 | Ht 65.5 in | Wt 155.0 lb

## 2012-12-15 DIAGNOSIS — L02419 Cutaneous abscess of limb, unspecified: Secondary | ICD-10-CM | POA: Diagnosis not present

## 2012-12-15 DIAGNOSIS — Z1231 Encounter for screening mammogram for malignant neoplasm of breast: Secondary | ICD-10-CM

## 2012-12-15 DIAGNOSIS — L03116 Cellulitis of left lower limb: Secondary | ICD-10-CM

## 2012-12-15 LAB — POCT CBC
Granulocyte percent: 72.5 %G (ref 37–80)
HCT, POC: 45.5 % (ref 37.7–47.9)
MCH, POC: 29.9 pg (ref 27–31.2)
MCV: 95 fL (ref 80–97)
MID (cbc): 0.4 (ref 0–0.9)
POC LYMPH PERCENT: 20.3 %L (ref 10–50)
POC MID %: 7.2 %M (ref 0–12)
Platelet Count, POC: 184 10*3/uL (ref 142–424)
RBC: 4.79 M/uL (ref 4.04–5.48)
RDW, POC: 15 %
WBC: 5.9 10*3/uL (ref 4.6–10.2)

## 2012-12-15 MED ORDER — CEFTRIAXONE SODIUM 1 G IJ SOLR
1.0000 g | Freq: Once | INTRAMUSCULAR | Status: AC
  Administered 2012-12-15: 1 g via INTRAMUSCULAR

## 2012-12-15 NOTE — Patient Instructions (Signed)
1. RETURN TOMORROW, Friday 10/31 AT 12:30PM AT [address] FOR LEG CELLULITIS.

## 2012-12-15 NOTE — Progress Notes (Signed)
[address]   [place], [place]  [address]   [phone]  Subjective:    Patient ID: Marcia [name], female    DOB: [birth year], 78 y.o.   MRN: [id number]  HPI This 78 y.o. female presents for 24 hour follow-up of L leg cellulitis.  No fever/chills/sweats. Tolerating Levaquin and Doxycycline; took two doses of antibiotics thus far.  No nausea.  Scant drainage from leg.  Elevating leg when possible.  No ice or heat.  Sugar elevated at 327 this morning; husband gave extra insulin.  Review of Systems  Constitutional: Negative for fever, chills, diaphoresis and fatigue.  Skin: Positive for color change, rash and wound.   Past Medical History  Diagnosis Date  . Diabetes mellitus   . Rheumatoid arthritis(714.0)   . Cancer   . Carcinoma     lung cell-stage1  . Colonic polyp   . Neuropathy     peripheral  . Goiter     with hypothyrodism  . Osteopenia   . Hyperlipemia   . Lymph node disorder 10R,11R    excision  . Memory loss    Past Surgical History  Procedure Laterality Date  . Cesarean section    . Lung surgery Right 2006    recurrance 2009,lower lobe superior segment,resection:2007  . Cataract extraction Bilateral   . Wrist surgery Right   . Foot surgery Right     x3  . Lung surgery Right     secondary lower lobe margin, excision,no tumor indentified   Allergies  Allergen Reactions  . Amoxicillin Nausea And Vomiting  . Cefaclor Nausea And Vomiting  . Hydrocodone Nausea And Vomiting  . Sulfa Antibiotics     " legs give out"   Current Outpatient Prescriptions on File Prior to Visit  Medication Sig Dispense Refill  . aspirin 81 MG tablet Take 81 mg by mouth daily.       Marland Kitchen atorvastatin (LIPITOR) 40 MG tablet Take 40 mg by mouth daily.      . Calcium Carbonate-Vitamin D (CALCIUM-VITAMIN D) 500-200 MG-UNIT per tablet Take 1 tablet by mouth 2 (two) times daily with a meal.      . donepezil (ARICEPT) 10 MG tablet Take 1 tablet (10 mg total) by mouth at bedtime.  90 tablet  3   . doxycycline (VIBRAMYCIN) 100 MG capsule Take 1 capsule (100 mg total) by mouth 2 (two) times daily.  20 capsule  0  . ezetimibe (ZETIA) 10 MG tablet Take 10 mg by mouth daily.      Marland Kitchen HUMALOG MIX 75/25 KWIKPEN (75-25) 100 UNIT/ML SUSP 26 Units every morning. And take 16 units in pm      . levofloxacin (LEVAQUIN) 500 MG tablet Take 1 tablet (500 mg total) by mouth daily.  10 tablet  0  . predniSONE (DELTASONE) 5 MG tablet Take 5 mg by mouth daily.      . traZODone (DESYREL) 100 MG tablet       . clindamycin (CLEOCIN) 150 MG capsule Take 150 mg by mouth 4 (four) times daily.       No current facility-administered medications on file prior to visit.       Objective:   Physical Exam  Nursing note and vitals reviewed. Constitutional: She is oriented to person, place, and time. She appears well-developed and well-nourished. No distress.  Neurological: She is alert and oriented to person, place, and time.  Skin: Skin is warm and dry. No rash noted. She is not diaphoretic. There  is erythema. No pallor.  L lower leg with 2.0 cm eschar with scant white-yellow-bloody drainage; +TTP diffusely; no fluctuants.  No calf TTP.  No worsening erythema from yesterday visit; erythema slightly improved but still diffuse.     ROCEPHIN 1 GRAM IM ADMINISTERED IN OFFICE.  Results for orders placed in visit on 12/15/12  POCT CBC      Result Value Range   WBC 5.9  4.6 - 10.2 K/uL   Lymph, poc 1.2  0.6 - 3.4   POC LYMPH PERCENT 20.3  10 - 50 %L   MID (cbc) 0.4  0 - 0.9   POC MID % 7.2  0 - 12 %M   POC Granulocyte 4.3  2 - 6.9   Granulocyte percent 72.5  37 - 80 %G   RBC 4.79  4.04 - 5.48 M/uL   Hemoglobin 14.3  12.2 - 16.2 g/dL   HCT, POC 46.9  62.9 - 47.9 %   MCV 95.0  80 - 97 fL   MCH, POC 29.9  27 - 31.2 pg   MCHC 31.4 (*) 31.8 - 35.4 g/dL   RDW, POC 52.8     Platelet Count, POC 184  142 - 424 K/uL   MPV 10.8  0 - 99.8 fL  GLUCOSE, POCT (MANUAL RESULT ENTRY)      Result Value Range   POC Glucose  150 (*) 70 - 99 mg/dl       Assessment & Plan:  Cellulitis of leg, left - Plan: POCT CBC  Type II or unspecified type diabetes mellitus without mention of complication, uncontrolled - Plan: POCT glucose (manual entry)  1.  L leg cellulitis: unchanged from yesterday without worsening; s/p Rocephin 1 gram IM during visit; continue Levaquin 500mg  daily, Doxycycline 100mg  bid.  RTC 12/16/12 at 104 at 12:30 for follow-up.  Elevate leg as much as possible; apply heat to area bid. 2. DMII: uncontrolled which will interfere with wound healing; close follow-up.  Meds ordered this encounter  Medications  . cefTRIAXone (ROCEPHIN) injection 1 g    Sig:    Nilda Simmer, M.D.  [facility] [address] [place], [place]  [address] [phone] phone [phone] fax

## 2012-12-16 ENCOUNTER — Ambulatory Visit (INDEPENDENT_AMBULATORY_CARE_PROVIDER_SITE_OTHER): Payer: Medicare Other | Admitting: Family Medicine

## 2012-12-16 ENCOUNTER — Encounter: Payer: Self-pay | Admitting: Family Medicine

## 2012-12-16 VITALS — BP 142/58 | HR 83 | Temp 98.1°F | Resp 16 | Ht 65.0 in | Wt 154.0 lb

## 2012-12-16 DIAGNOSIS — L03116 Cellulitis of left lower limb: Secondary | ICD-10-CM

## 2012-12-16 DIAGNOSIS — L02419 Cutaneous abscess of limb, unspecified: Secondary | ICD-10-CM | POA: Diagnosis not present

## 2012-12-16 NOTE — Patient Instructions (Signed)
1. Return on Monday, 11/3 12:30pm; you can arrive earlier if needed from 8:00-12:00am.

## 2012-12-16 NOTE — Progress Notes (Signed)
  Subjective:    Patient ID: Marcia [name], female    DOB: 1934/08/31, 78 y.o.   MRN: [id number]  HPI This 78 y.o. female presents with husband for 24 hour follow-up of LLE celluitis.  Compliance with Doxycycline and Levaquin.  S/p repeat Rocephin injection yesterday. Denies fever/chills/sweats. No decrease in activity level or appetite.  Left leg with no more and maybe less redness. No drainage. Patient thinks it is better. Fasting blood sugar 137 this am.  Review of Systems  Constitutional: Negative for fever, chills, diaphoresis, activity change, appetite change and fatigue.  Cardiovascular: Positive for leg swelling.  Skin: Positive for color change and wound. Negative for pallor and rash.   Past Medical History  Diagnosis Date  . Diabetes mellitus   . Rheumatoid arthritis(714.0)   . Cancer   . Carcinoma     lung cell-stage1  . Colonic polyp   . Neuropathy     peripheral  . Goiter     with hypothyrodism  . Osteopenia   . Hyperlipemia   . Lymph node disorder 10R,11R    excision  . Memory loss    Allergies  Allergen Reactions  . Amoxicillin Nausea And Vomiting  . Cefaclor Nausea And Vomiting  . Hydrocodone Nausea And Vomiting  . Sulfa Antibiotics     " legs give out"   Current Outpatient Prescriptions on File Prior to Visit  Medication Sig Dispense Refill  . aspirin 81 MG tablet Take 81 mg by mouth daily.       Marland Kitchen atorvastatin (LIPITOR) 40 MG tablet Take 40 mg by mouth daily.      . Calcium Carbonate-Vitamin D (CALCIUM-VITAMIN D) 500-200 MG-UNIT per tablet Take 1 tablet by mouth 2 (two) times daily with a meal.      . ezetimibe (ZETIA) 10 MG tablet Take 10 mg by mouth daily.      Marland Kitchen HUMALOG MIX 75/25 KWIKPEN (75-25) 100 UNIT/ML SUSP 26 Units every morning. And take 16 units in pm      . predniSONE (DELTASONE) 5 MG tablet Take 5 mg by mouth daily.       No current facility-administered medications on file prior to visit.       Objective:   Physical Exam  Nursing  note and vitals reviewed. Constitutional: She appears well-developed and well-nourished. No distress.  Cardiovascular:  LLE with distal swelling moderately.  Skin: No rash noted. She is not diaphoretic. There is erythema. No pallor.  LLE with persistent diffuse erythema, mild warmth, mild TTP.  No streaking.  +eschar present with mild surrounding erythema and no active drainage.   Pleasant female in NAD.     Assessment & Plan:  Cellulitis of leg, left  Type II or unspecified type diabetes mellitus without mention of complication, uncontrolled  1. LLE cellulitis: persistent with minimal improvement yet no worsening; continue Levaquin and Doxycycline.  Follow-up in 72 hours and sooner if worse or develops fever, change in activity or appetite. 2.  DMII: uncontrolled; continue to monitor closely.  No orders of the defined types were placed in this encounter.   Nilda Simmer, M.D.  [facility] [address] [place], [place]  [address] [phone] phone [phone] fax

## 2012-12-17 LAB — WOUND CULTURE: Gram Stain: NONE SEEN

## 2012-12-19 ENCOUNTER — Ambulatory Visit (INDEPENDENT_AMBULATORY_CARE_PROVIDER_SITE_OTHER): Payer: Medicare Other | Admitting: Family Medicine

## 2012-12-19 ENCOUNTER — Encounter: Payer: Self-pay | Admitting: Family Medicine

## 2012-12-19 VITALS — BP 151/62 | HR 74 | Temp 97.8°F | Resp 16 | Ht 65.0 in | Wt 151.0 lb

## 2012-12-19 DIAGNOSIS — L02419 Cutaneous abscess of limb, unspecified: Secondary | ICD-10-CM

## 2012-12-19 DIAGNOSIS — L03116 Cellulitis of left lower limb: Secondary | ICD-10-CM

## 2012-12-21 ENCOUNTER — Ambulatory Visit (INDEPENDENT_AMBULATORY_CARE_PROVIDER_SITE_OTHER): Payer: Medicare Other | Admitting: Family Medicine

## 2012-12-21 ENCOUNTER — Encounter: Payer: Self-pay | Admitting: Family Medicine

## 2012-12-21 VITALS — BP 116/72 | HR 90 | Temp 98.9°F | Resp 17 | Ht 66.0 in | Wt 156.0 lb

## 2012-12-21 DIAGNOSIS — M069 Rheumatoid arthritis, unspecified: Secondary | ICD-10-CM | POA: Diagnosis not present

## 2012-12-21 DIAGNOSIS — M2550 Pain in unspecified joint: Secondary | ICD-10-CM | POA: Diagnosis not present

## 2012-12-21 DIAGNOSIS — S81802S Unspecified open wound, left lower leg, sequela: Secondary | ICD-10-CM

## 2012-12-21 DIAGNOSIS — R7989 Other specified abnormal findings of blood chemistry: Secondary | ICD-10-CM | POA: Diagnosis not present

## 2012-12-21 DIAGNOSIS — L02419 Cutaneous abscess of limb, unspecified: Secondary | ICD-10-CM | POA: Diagnosis not present

## 2012-12-21 DIAGNOSIS — L03116 Cellulitis of left lower limb: Secondary | ICD-10-CM

## 2012-12-21 DIAGNOSIS — IMO0002 Reserved for concepts with insufficient information to code with codable children: Secondary | ICD-10-CM

## 2012-12-21 MED ORDER — CIPROFLOXACIN HCL 500 MG PO TABS: 500.0000 mg | ORAL_TABLET | Freq: Two times a day (BID) | ORAL | Status: DC

## 2012-12-28 ENCOUNTER — Encounter (HOSPITAL_BASED_OUTPATIENT_CLINIC_OR_DEPARTMENT_OTHER): Payer: Medicare Other | Attending: General Surgery

## 2012-12-28 DIAGNOSIS — L97809 Non-pressure chronic ulcer of other part of unspecified lower leg with unspecified severity: Secondary | ICD-10-CM | POA: Insufficient documentation

## 2012-12-28 DIAGNOSIS — E1169 Type 2 diabetes mellitus with other specified complication: Secondary | ICD-10-CM | POA: Diagnosis not present

## 2013-01-05 ENCOUNTER — Other Ambulatory Visit (HOSPITAL_COMMUNITY): Payer: Self-pay | Admitting: General Surgery

## 2013-01-05 ENCOUNTER — Ambulatory Visit (HOSPITAL_COMMUNITY)
Admission: RE | Admit: 2013-01-05 | Discharge: 2013-01-05 | Disposition: A | Discharge status: Home / Self Care | Payer: Medicare Other | Source: Ambulatory Visit | Attending: Vascular Surgery | Admitting: Vascular Surgery

## 2013-01-05 DIAGNOSIS — L98499 Non-pressure chronic ulcer of skin of other sites with unspecified severity: Secondary | ICD-10-CM | POA: Insufficient documentation

## 2013-01-05 DIAGNOSIS — IMO0002 Reserved for concepts with insufficient information to code with codable children: Secondary | ICD-10-CM

## 2013-01-06 ENCOUNTER — Ambulatory Visit
Admission: RE | Admit: 2013-01-06 | Discharge: 2013-01-06 | Disposition: A | Discharge status: Home / Self Care | Payer: PRIVATE HEALTH INSURANCE | Source: Ambulatory Visit

## 2013-01-06 DIAGNOSIS — Z1231 Encounter for screening mammogram for malignant neoplasm of breast: Secondary | ICD-10-CM | POA: Diagnosis not present

## 2013-01-06 DIAGNOSIS — E1169 Type 2 diabetes mellitus with other specified complication: Secondary | ICD-10-CM | POA: Diagnosis not present

## 2013-01-06 DIAGNOSIS — L97809 Non-pressure chronic ulcer of other part of unspecified lower leg with unspecified severity: Secondary | ICD-10-CM | POA: Diagnosis not present

## 2013-01-10 ENCOUNTER — Other Ambulatory Visit: Payer: Self-pay | Admitting: Family Medicine

## 2013-01-10 DIAGNOSIS — R928 Other abnormal and inconclusive findings on diagnostic imaging of breast: Secondary | ICD-10-CM

## 2013-01-11 DIAGNOSIS — F068 Other specified mental disorders due to known physiological condition: Secondary | ICD-10-CM | POA: Diagnosis not present

## 2013-01-11 DIAGNOSIS — E1142 Type 2 diabetes mellitus with diabetic polyneuropathy: Secondary | ICD-10-CM | POA: Diagnosis not present

## 2013-01-11 DIAGNOSIS — E11319 Type 2 diabetes mellitus with unspecified diabetic retinopathy without macular edema: Secondary | ICD-10-CM | POA: Diagnosis not present

## 2013-01-11 DIAGNOSIS — E538 Deficiency of other specified B group vitamins: Secondary | ICD-10-CM | POA: Diagnosis not present

## 2013-01-11 DIAGNOSIS — E785 Hyperlipidemia, unspecified: Secondary | ICD-10-CM | POA: Diagnosis not present

## 2013-01-11 DIAGNOSIS — E1049 Type 1 diabetes mellitus with other diabetic neurological complication: Secondary | ICD-10-CM | POA: Diagnosis not present

## 2013-01-11 DIAGNOSIS — M069 Rheumatoid arthritis, unspecified: Secondary | ICD-10-CM | POA: Diagnosis not present

## 2013-01-11 DIAGNOSIS — Z1331 Encounter for screening for depression: Secondary | ICD-10-CM | POA: Diagnosis not present

## 2013-01-11 DIAGNOSIS — C3490 Malignant neoplasm of unspecified part of unspecified bronchus or lung: Secondary | ICD-10-CM | POA: Diagnosis not present

## 2013-01-18 ENCOUNTER — Other Ambulatory Visit (HOSPITAL_COMMUNITY): Payer: Self-pay | Admitting: Endocrinology

## 2013-01-18 ENCOUNTER — Ambulatory Visit (HOSPITAL_COMMUNITY)
Admission: RE | Admit: 2013-01-18 | Discharge: 2013-01-18 | Disposition: A | Discharge status: Home / Self Care | Payer: Medicare Other | Source: Ambulatory Visit | Attending: Vascular Surgery | Admitting: Vascular Surgery

## 2013-01-18 DIAGNOSIS — R0989 Other specified symptoms and signs involving the circulatory and respiratory systems: Secondary | ICD-10-CM

## 2013-01-20 ENCOUNTER — Encounter (HOSPITAL_BASED_OUTPATIENT_CLINIC_OR_DEPARTMENT_OTHER): Payer: Medicare Other | Attending: General Surgery

## 2013-01-20 DIAGNOSIS — L97809 Non-pressure chronic ulcer of other part of unspecified lower leg with unspecified severity: Secondary | ICD-10-CM | POA: Insufficient documentation

## 2013-01-20 DIAGNOSIS — E1169 Type 2 diabetes mellitus with other specified complication: Secondary | ICD-10-CM | POA: Insufficient documentation

## 2013-01-27 ENCOUNTER — Ambulatory Visit
Admission: RE | Admit: 2013-01-27 | Discharge: 2013-01-27 | Disposition: A | Discharge status: Home / Self Care | Payer: Medicare Other | Source: Ambulatory Visit | Attending: Family Medicine | Admitting: Family Medicine

## 2013-01-27 DIAGNOSIS — L97809 Non-pressure chronic ulcer of other part of unspecified lower leg with unspecified severity: Secondary | ICD-10-CM | POA: Diagnosis not present

## 2013-01-27 DIAGNOSIS — E1169 Type 2 diabetes mellitus with other specified complication: Secondary | ICD-10-CM | POA: Diagnosis not present

## 2013-01-27 DIAGNOSIS — R928 Other abnormal and inconclusive findings on diagnostic imaging of breast: Secondary | ICD-10-CM | POA: Diagnosis not present

## 2013-01-30 DIAGNOSIS — Z0000 Encounter for general adult medical examination without abnormal findings: Secondary | ICD-10-CM | POA: Diagnosis not present

## 2013-01-30 DIAGNOSIS — G479 Sleep disorder, unspecified: Secondary | ICD-10-CM | POA: Diagnosis not present

## 2013-01-30 DIAGNOSIS — E7800 Pure hypercholesterolemia, unspecified: Secondary | ICD-10-CM | POA: Diagnosis not present

## 2013-01-30 DIAGNOSIS — M069 Rheumatoid arthritis, unspecified: Secondary | ICD-10-CM | POA: Diagnosis not present

## 2013-01-30 DIAGNOSIS — E039 Hypothyroidism, unspecified: Secondary | ICD-10-CM | POA: Diagnosis not present

## 2013-01-30 DIAGNOSIS — D696 Thrombocytopenia, unspecified: Secondary | ICD-10-CM | POA: Diagnosis not present

## 2013-01-30 DIAGNOSIS — Z1331 Encounter for screening for depression: Secondary | ICD-10-CM | POA: Diagnosis not present

## 2013-01-30 DIAGNOSIS — E119 Type 2 diabetes mellitus without complications: Secondary | ICD-10-CM | POA: Diagnosis not present

## 2013-03-16 DIAGNOSIS — D692 Other nonthrombocytopenic purpura: Secondary | ICD-10-CM | POA: Diagnosis not present

## 2013-03-16 DIAGNOSIS — I8310 Varicose veins of unspecified lower extremity with inflammation: Secondary | ICD-10-CM | POA: Diagnosis not present

## 2013-03-16 DIAGNOSIS — Z85828 Personal history of other malignant neoplasm of skin: Secondary | ICD-10-CM | POA: Diagnosis not present

## 2013-03-23 DIAGNOSIS — IMO0002 Reserved for concepts with insufficient information to code with codable children: Secondary | ICD-10-CM | POA: Diagnosis not present

## 2013-03-23 DIAGNOSIS — M069 Rheumatoid arthritis, unspecified: Secondary | ICD-10-CM | POA: Diagnosis not present

## 2013-03-23 DIAGNOSIS — R7989 Other specified abnormal findings of blood chemistry: Secondary | ICD-10-CM | POA: Diagnosis not present

## 2013-03-23 DIAGNOSIS — M2550 Pain in unspecified joint: Secondary | ICD-10-CM | POA: Diagnosis not present

## 2013-04-05 ENCOUNTER — Other Ambulatory Visit (HOSPITAL_COMMUNITY): Payer: Medicare Other

## 2013-04-05 ENCOUNTER — Other Ambulatory Visit (HOSPITAL_BASED_OUTPATIENT_CLINIC_OR_DEPARTMENT_OTHER): Payer: Medicare Other

## 2013-04-05 ENCOUNTER — Encounter (INDEPENDENT_AMBULATORY_CARE_PROVIDER_SITE_OTHER): Payer: Self-pay

## 2013-04-05 ENCOUNTER — Encounter (HOSPITAL_BASED_OUTPATIENT_CLINIC_OR_DEPARTMENT_OTHER): Payer: Medicare Other | Attending: General Surgery

## 2013-04-05 ENCOUNTER — Ambulatory Visit (HOSPITAL_COMMUNITY)
Admission: RE | Admit: 2013-04-05 | Discharge: 2013-04-05 | Disposition: A | Discharge status: Home / Self Care | Payer: Medicare Other | Source: Ambulatory Visit | Attending: Internal Medicine | Admitting: Internal Medicine

## 2013-04-05 DIAGNOSIS — J90 Pleural effusion, not elsewhere classified: Secondary | ICD-10-CM | POA: Diagnosis not present

## 2013-04-05 DIAGNOSIS — C3490 Malignant neoplasm of unspecified part of unspecified bronchus or lung: Secondary | ICD-10-CM

## 2013-04-05 DIAGNOSIS — L97809 Non-pressure chronic ulcer of other part of unspecified lower leg with unspecified severity: Secondary | ICD-10-CM | POA: Insufficient documentation

## 2013-04-05 DIAGNOSIS — C3410 Malignant neoplasm of upper lobe, unspecified bronchus or lung: Secondary | ICD-10-CM

## 2013-04-05 DIAGNOSIS — J9383 Other pneumothorax: Secondary | ICD-10-CM | POA: Diagnosis not present

## 2013-04-05 DIAGNOSIS — E1169 Type 2 diabetes mellitus with other specified complication: Secondary | ICD-10-CM | POA: Diagnosis not present

## 2013-04-05 LAB — COMPREHENSIVE METABOLIC PANEL (CC13)
ALT: 24 U/L (ref 0–55)
ANION GAP: 9 meq/L (ref 3–11)
AST: 20 U/L (ref 5–34)
Albumin: 3.3 g/dL — ABNORMAL LOW (ref 3.5–5.0)
Alkaline Phosphatase: 106 U/L (ref 40–150)
BUN: 21.8 mg/dL (ref 7.0–26.0)
CALCIUM: 9.7 mg/dL (ref 8.4–10.4)
CHLORIDE: 104 meq/L (ref 98–109)
CO2: 29 meq/L (ref 22–29)
Creatinine: 0.9 mg/dL (ref 0.6–1.1)
Glucose: 297 mg/dl — ABNORMAL HIGH (ref 70–140)
POTASSIUM: 4.9 meq/L (ref 3.5–5.1)
SODIUM: 142 meq/L (ref 136–145)
TOTAL PROTEIN: 6.2 g/dL — AB (ref 6.4–8.3)
Total Bilirubin: 0.42 mg/dL (ref 0.20–1.20)

## 2013-04-05 LAB — CBC WITH DIFFERENTIAL/PLATELET
BASO%: 0.4 % (ref 0.0–2.0)
Basophils Absolute: 0 10*3/uL (ref 0.0–0.1)
EOS%: 0.5 % (ref 0.0–7.0)
Eosinophils Absolute: 0 10*3/uL (ref 0.0–0.5)
HCT: 40.1 % (ref 34.8–46.6)
HGB: 13.2 g/dL (ref 11.6–15.9)
LYMPH#: 1 10*3/uL (ref 0.9–3.3)
LYMPH%: 15.5 % (ref 14.0–49.7)
MCH: 29.1 pg (ref 25.1–34.0)
MCHC: 32.9 g/dL (ref 31.5–36.0)
MCV: 88.4 fL (ref 79.5–101.0)
MONO#: 0.6 10*3/uL (ref 0.1–0.9)
MONO%: 8.4 % (ref 0.0–14.0)
NEUT#: 5 10*3/uL (ref 1.5–6.5)
NEUT%: 75.2 % (ref 38.4–76.8)
Platelets: 155 10*3/uL (ref 145–400)
RBC: 4.53 10*6/uL (ref 3.70–5.45)
RDW: 14.6 % — ABNORMAL HIGH (ref 11.2–14.5)
WBC: 6.6 10*3/uL (ref 3.9–10.3)

## 2013-04-05 MED ORDER — IOHEXOL 300 MG/ML  SOLN
80.0000 mL | Freq: Once | INTRAMUSCULAR | Status: AC | PRN
  Administered 2013-04-05: 80 mL via INTRAVENOUS

## 2013-04-06 ENCOUNTER — Encounter: Payer: Self-pay | Admitting: Internal Medicine

## 2013-04-06 ENCOUNTER — Ambulatory Visit (HOSPITAL_BASED_OUTPATIENT_CLINIC_OR_DEPARTMENT_OTHER): Payer: Medicare Other | Admitting: Internal Medicine

## 2013-04-06 ENCOUNTER — Telehealth: Payer: Self-pay | Admitting: Internal Medicine

## 2013-04-06 VITALS — BP 168/63 | HR 84 | Temp 98.2°F | Resp 18 | Ht 66.0 in | Wt 166.2 lb

## 2013-04-06 DIAGNOSIS — C3490 Malignant neoplasm of unspecified part of unspecified bronchus or lung: Secondary | ICD-10-CM

## 2013-04-06 DIAGNOSIS — C3410 Malignant neoplasm of upper lobe, unspecified bronchus or lung: Secondary | ICD-10-CM

## 2013-04-06 NOTE — Telephone Encounter (Signed)
gv and printed appt sched and avs for pt for Feb 2016

## 2013-04-07 ENCOUNTER — Other Ambulatory Visit: Payer: Self-pay | Admitting: *Deleted

## 2013-04-08 NOTE — Progress Notes (Signed)
[facility] [phone]   Fax:([phone]) [place], [place], [place] [address]  PRINCIPAL DIAGNOSIS:  1. 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 2. History of stage IA (T1, N0, MX) non-small cell lung cancer diagnosed in January 2007.  PRIOR THERAPY:  1. Status post right upper lobe superior segmentectomy as well as lymph node sampling under the care of Dr. Arlyce Dice on March 09, 2005. 2. Status post right lower lobe superior segmentectomy with lymph node dissection under the care of Dr. Arlyce Dice on June 13, 2007 and the pathology revealed mixed small cell and adenocarcinoma. 3. Status post 4 cycles of adjuvant chemotherapy with carboplatin and etoposide. Last dose was given September 30, 2007.  CURRENT THERAPY: Observation.   INTERVAL HISTORY: Marcia [name] 79 y.o. female returns to the clinic today for six-month follow up visit accompanied her husband. The patient is feeling fine today with no specific complaints except for mild fatigue. She denied having any significant nausea or vomiting. She has no chest pain, shortness breath, cough or hemoptysis.she had repeat CT scan of the chest performed recently and she is here for evaluation and discussion of her scan results.  MEDICAL HISTORY: Past Medical History  Diagnosis Date  . Diabetes mellitus   . Rheumatoid arthritis(714.0)   . Cancer   . Carcinoma     lung cell-stage1  . Colonic polyp   . Neuropathy     peripheral  . Goiter     with hypothyrodism  . Osteopenia   . Hyperlipemia   . Lymph node disorder 10R,11R    excision  . Memory loss     ALLERGIES:  is allergic to amoxicillin; cefaclor; hydrocodone; and sulfa antibiotics.  MEDICATIONS:  Current Outpatient Prescriptions  Medication Sig Dispense Refill  . aspirin 81 MG tablet Take 81 mg by mouth daily.       Marland Kitchen atorvastatin  (LIPITOR) 40 MG tablet Take 40 mg by mouth daily.      . Calcium Carbonate-Vitamin D (CALCIUM-VITAMIN D) 500-200 MG-UNIT per tablet Take 1 tablet by mouth 2 (two) times daily with a meal.      . donepezil (ARICEPT) 10 MG tablet Take 1 tablet (10 mg total) by mouth at bedtime.  90 tablet  3  . ezetimibe (ZETIA) 10 MG tablet Take 10 mg by mouth daily.      Marland Kitchen HUMALOG MIX 75/25 KWIKPEN (75-25) 100 UNIT/ML SUSP 26 Units every morning. And take 16 units in pm      . predniSONE (DELTASONE) 5 MG tablet Take 5 mg by mouth daily.      . traZODone (DESYREL) 100 MG tablet        No current facility-administered medications for this visit.    SURGICAL HISTORY:  Past Surgical History  Procedure Laterality Date  . Cesarean section    . Lung surgery Right 2006    recurrance 2009,lower lobe superior segment,resection:2007  . Cataract extraction Bilateral   . Wrist surgery Right   . Foot surgery Right     x3  . Lung surgery Right     secondary lower lobe margin, excision,no tumor indentified    REVIEW OF SYSTEMS:  A comprehensive review of systems was negative.   PHYSICAL EXAMINATION: General appearance: alert, cooperative and no distress Head: Normocephalic, without obvious abnormality, atraumatic Neck: no adenopathy Lymph nodes: Cervical, supraclavicular, and axillary  nodes normal. Resp: clear to auscultation bilaterally Cardio: regular rate and rhythm, S1, S2 normal, no murmur, click, rub or gallop GI: soft, non-tender; bowel sounds normal; no masses,  no organomegaly Extremities: extremities normal, atraumatic, no cyanosis or edema  ECOG PERFORMANCE STATUS: 1 - Symptomatic but completely ambulatory  Blood pressure 168/63, pulse 84, temperature 98.2 F (36.8 C), temperature source Oral, resp. rate 18, height 5\' 6"  (1.676 m), weight 166 lb 3.2 oz (75.388 kg).  LABORATORY DATA: Lab Results  Component Value Date   WBC 6.6 04/05/2013   HGB 13.2 04/05/2013   HCT 40.1 04/05/2013   MCV 88.4  04/05/2013   PLT 155 04/05/2013      Chemistry      Component Value Date/Time   NA 142 04/05/2013 0917   NA 141 09/04/2011 1011   NA 134* 09/01/2011 1758   K 4.9 04/05/2013 0917   K 4.4 09/04/2011 1011   K 3.5 09/01/2011 1758   CL 99 04/04/2012 0921   CL 99 09/04/2011 1011   CL 98 09/01/2011 1758   CO2 29 04/05/2013 0917   CO2 28 09/04/2011 1011   CO2 27 09/01/2011 1758   BUN 21.8 04/05/2013 0917   BUN 17 09/04/2011 1011   BUN 12 09/01/2011 1758   CREATININE 0.9 04/05/2013 0917   CREATININE 0.7 09/04/2011 1011   CREATININE 0.62 09/01/2011 1758      Component Value Date/Time   CALCIUM 9.7 04/05/2013 0917   CALCIUM 9.1 09/04/2011 1011   CALCIUM 9.4 09/01/2011 1758   ALKPHOS 106 04/05/2013 0917   ALKPHOS 140* 09/04/2011 1011   ALKPHOS 123* 09/01/2011 1758   AST 20 04/05/2013 0917   AST 21 09/04/2011 1011   AST 11 09/01/2011 1758   ALT 24 04/05/2013 0917   ALT 23 09/04/2011 1011   ALT 8 09/01/2011 1758   BILITOT 0.42 04/05/2013 0917   BILITOT 0.50 09/04/2011 1011   BILITOT 0.2* 09/01/2011 1758       RADIOGRAPHIC STUDIES: Ct Chest W Contrast  04/05/2013   CLINICAL DATA:  This follows ACR consensus guidelines: Managing Incidental Thyroid Nodules Detected on Imaging: White Paper of the ACR Incidental Thyroid Findings Committee. J AM Coll Radiol, Published On-line: December 17, 2012.  This recommendation follows ACR consensus guidelines: White Paper of the ACR Incidental Findings Committee II on Splenic and Nodal Findings. [place] [phone].  EXAM: CT CHEST WITH CONTRAST  TECHNIQUE: Multidetector CT imaging of the chest was performed during intravenous contrast administration.  CONTRAST:  67mL OMNIPAQUE IOHEXOL 300 MG/ML  SOLN  COMPARISON:  10/05/12  FINDINGS: Post treatment changes in the posterior right upper lung field remains stable, including a small loculated hydro pneumothorax. No evidence of suspicious pulmonary nodules or masses. No evidence of acute infiltrate or central endobronchial  lesion. Mild emphysematous changes again noted. No evidence of pleural effusion. No evidence of hilar or mediastinal lymphadenopathy. No adenopathy seen elsewhere within the thorax.  Small hiatal hernia again noted. No evidence of chest wall mass or suspicious bone lesions. Both adrenal glands are normal in appearance.  IMPRESSION: Stable postsurgical changes in right hemi thorax. No evidence of recurrent or metastatic carcinoma. No other active disease.   Electronically Signed   By: Earle Gell M.D.   On: 04/05/2013 10:30   ASSESSMENT AND PLAN: This is a very pleasant 79 years old white female with recurrent non-small cell lung cancer. She is currently on observation with no evidence for disease recurrence on the recent scan.  I recommended for the patient to continue on observation for now with repeat CT scan of the chest in 1 year. She was advised to call immediately if she has any concerning symptoms in the interval.  The patient voices understanding of current disease status and treatment options and is in agreement with the current care plan.  All questions were answered. The patient knows to call the clinic with any problems, questions or concerns. We can certainly see the patient much sooner if necessary.  Disclaimer: This note was dictated with voice recognition software. Similar sounding words can inadvertently be transcribed and may not be corrected upon review.

## 2013-04-08 NOTE — Patient Instructions (Signed)
Followup visit in one year with repeat CT scan of the chest. 

## 2013-04-12 DIAGNOSIS — E1049 Type 1 diabetes mellitus with other diabetic neurological complication: Secondary | ICD-10-CM | POA: Diagnosis not present

## 2013-04-12 DIAGNOSIS — E049 Nontoxic goiter, unspecified: Secondary | ICD-10-CM | POA: Diagnosis not present

## 2013-04-12 DIAGNOSIS — E1169 Type 2 diabetes mellitus with other specified complication: Secondary | ICD-10-CM | POA: Diagnosis not present

## 2013-04-12 DIAGNOSIS — E1142 Type 2 diabetes mellitus with diabetic polyneuropathy: Secondary | ICD-10-CM | POA: Diagnosis not present

## 2013-04-12 DIAGNOSIS — E039 Hypothyroidism, unspecified: Secondary | ICD-10-CM | POA: Diagnosis not present

## 2013-04-12 DIAGNOSIS — E538 Deficiency of other specified B group vitamins: Secondary | ICD-10-CM | POA: Diagnosis not present

## 2013-04-12 DIAGNOSIS — E11319 Type 2 diabetes mellitus with unspecified diabetic retinopathy without macular edema: Secondary | ICD-10-CM | POA: Diagnosis not present

## 2013-04-12 DIAGNOSIS — F068 Other specified mental disorders due to known physiological condition: Secondary | ICD-10-CM | POA: Diagnosis not present

## 2013-04-12 DIAGNOSIS — R7402 Elevation of levels of lactic acid dehydrogenase (LDH): Secondary | ICD-10-CM | POA: Diagnosis not present

## 2013-04-12 DIAGNOSIS — R7401 Elevation of levels of liver transaminase levels: Secondary | ICD-10-CM | POA: Diagnosis not present

## 2013-04-12 DIAGNOSIS — E1065 Type 1 diabetes mellitus with hyperglycemia: Secondary | ICD-10-CM | POA: Diagnosis not present

## 2013-04-12 DIAGNOSIS — L97809 Non-pressure chronic ulcer of other part of unspecified lower leg with unspecified severity: Secondary | ICD-10-CM | POA: Diagnosis not present

## 2013-04-19 ENCOUNTER — Encounter (HOSPITAL_BASED_OUTPATIENT_CLINIC_OR_DEPARTMENT_OTHER): Payer: Medicare Other | Attending: General Surgery

## 2013-04-19 DIAGNOSIS — L97809 Non-pressure chronic ulcer of other part of unspecified lower leg with unspecified severity: Secondary | ICD-10-CM | POA: Insufficient documentation

## 2013-04-19 DIAGNOSIS — E1169 Type 2 diabetes mellitus with other specified complication: Secondary | ICD-10-CM | POA: Insufficient documentation

## 2013-05-12 DIAGNOSIS — N644 Mastodynia: Secondary | ICD-10-CM | POA: Diagnosis not present

## 2013-05-12 DIAGNOSIS — B356 Tinea cruris: Secondary | ICD-10-CM | POA: Diagnosis not present

## 2013-05-16 ENCOUNTER — Encounter (INDEPENDENT_AMBULATORY_CARE_PROVIDER_SITE_OTHER): Payer: Self-pay

## 2013-05-16 ENCOUNTER — Ambulatory Visit (INDEPENDENT_AMBULATORY_CARE_PROVIDER_SITE_OTHER): Payer: Medicare Other | Admitting: Neurology

## 2013-05-16 ENCOUNTER — Encounter: Payer: Self-pay | Admitting: Neurology

## 2013-05-16 VITALS — BP 146/68 | HR 78 | Resp 18 | Ht 65.0 in | Wt 164.0 lb

## 2013-05-16 DIAGNOSIS — F0390 Unspecified dementia without behavioral disturbance: Secondary | ICD-10-CM

## 2013-05-16 DIAGNOSIS — F068 Other specified mental disorders due to known physiological condition: Secondary | ICD-10-CM | POA: Diagnosis not present

## 2013-05-16 DIAGNOSIS — E1139 Type 2 diabetes mellitus with other diabetic ophthalmic complication: Secondary | ICD-10-CM | POA: Diagnosis not present

## 2013-05-16 DIAGNOSIS — E11339 Type 2 diabetes mellitus with moderate nonproliferative diabetic retinopathy without macular edema: Secondary | ICD-10-CM | POA: Diagnosis not present

## 2013-05-16 MED ORDER — DONEPEZIL HCL 23 MG PO TABS: 23.0000 mg | ORAL_TABLET | Freq: Every day | ORAL | Status: DC

## 2013-05-16 NOTE — Progress Notes (Signed)
[facility]  Provider:  Dr Brett Fairy Referring Provider:  Primary Care Physician:  Reginia Naas, MD  Chief Complaint  Patient presents with  . Follow-up    Room 10  . Dementia    MOCA- 6/30    HPI:  Marcia [name] is a 79 y.o. female here as a revisit from Dr.C. Tamala Julian for memory loss.    Marcia [name] is here today on her shed you would revisit, 2 days am all CA test scored at 6:30 points she was able to recall the day and the city she is in she had the greatest trouble with the immediate work recall for 5 words. She was able to abstract, able to repeat numbers forward and backwards and to the attention test  identifying the letter A.  Marcia [name] also states that work finding has been more delayed. Oral I think it is worse now to adjust her Aricept from 10 mg daily to 28 mg daily. I hope she will tolerate the higher doors, which is associated with a higher complaint rate of nausea and anorexia.  In the geriatric depression scale the patient endorsed one of the points.  Marcia [name] is now 79 years old from her last chemotherapy in the treatment of lung cancer of the right lung Norinyl. Please note that the patient had also been evaluated for the possibility of normal pressure hydrocephalus undergoing a diagnostic spinal tap which did not reveal improvement in her cognitive function morbidity or urinary control.  She is able to hold her breath well over 20 seconds and she has neither ataxia nor dysmetria. Her dementia has progressed, her leg ulcers have meanwhile healed.       History:  The patient was evaluated by spinal tap for a possible NPH, but showed no improvement after the procedure. See 2012 notes.  At that time her husband , an Optometrist , had to be in hospital and her diabetes became uncontrolled . The patient has been followed for over 40 years by Dr. Forde Dandy for her diabetes mellitus.She had originally presented at 79 years of age was confusional spells were  possibly attributable to a variable blood glucose levels. Per M.M.S.E test,  In November 2013 - 20 points. Since that date , all medication is handled by her husband . 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  Review of Systems: Out of a complete 14 system review, the patient complains of only the following symptoms, and all other reviewed systems are negative.  Memory loss, MMSE today at 19 points AFT only 15  points.  "no goofy spells over the last 12 month ' blood sugar is better controlled since her husband gives her the medication.  She cooks still some, not driving.    History   Social History  . Marital Status: Married    Spouse Name: Wynetta Emery    Number of Children: 3  . Years of Education: College   Occupational History  . Not on file.   Social History Main Topics  . Smoking status: Former Smoker    Quit date: 01/16/2005  . Smokeless tobacco: Never Used  . Alcohol Use: No  . Drug Use: No  . Sexual Activity: Yes    Birth Control/ Protection: None   Other Topics Concern  . Not on file   Social History Narrative   Pt lives at home with her spouse Wynetta Emery)   Patient has three children.   Patient is  retired.   Patient has a college education.   Patient is right-handed.   Caffeine Use-2 cups daily    History reviewed. No pertinent family history.  Past Medical History  Diagnosis Date  . Diabetes mellitus   . Rheumatoid arthritis(714.0)   . Cancer   . Carcinoma     lung cell-stage1  . Colonic polyp   . Neuropathy     peripheral  . Goiter     with hypothyrodism  . Osteopenia   . Hyperlipemia   . Lymph node disorder 10R,11R    excision  . Memory loss     Past Surgical History  Procedure Laterality Date  . Cesarean section    . Lung surgery Right 2006    recurrance 2009,lower lobe superior segment,resection:2007  . Cataract extraction Bilateral   . Wrist surgery Right   . Foot surgery Right     x3  . Lung  surgery Right     secondary lower lobe margin, excision,no tumor indentified      Allergies as of 05/16/2013 - Review Complete 05/16/2013  Allergen Reaction Noted  . Amoxicillin Nausea And Vomiting 03/09/2011  . Cefaclor Nausea And Vomiting 03/09/2011  . Hydrocodone Nausea And Vomiting 03/09/2011  . Sulfa antibiotics  03/09/2011    Vitals: BP 146/68  Pulse 78  Resp 18  Ht 5\' 5"  (1.651 m)  Wt 164 lb (74.39 kg)  BMI 27.29 kg/m2 Last Weight:  Wt Readings from Last 1 Encounters:  05/16/13 164 lb (74.39 kg)   Last Height:   Ht Readings from Last 1 Encounters:  05/16/13 5\' 5"  (1.651 m)    Physical exam:  General: The patient is awake, alert and appears not in acute distress. The patient is well groomed. Head: Normocephalic, atraumatic. Neck is supple.  15 inches, mallompati 2 . Cardiovascular:  Regular rate and rhythm, without  murmurs or carotid bruit, and without distended neck veins. Respiratory: Lungs are clear to auscultation. Skin:  Without evidence of edema, or rash Trunk: BMI is normal and patient  has normal posture.  Neurologic exam : The patient is awake and alert, oriented to place and time.  Memory subjective  described as declined . MMSE 19 points a out of 30.   There is abnormal attention span & concentration ability.  Speech is fluent with mild dysarthria, not  dysphonia  Not ,aphasia.  Mood and affect are appropriate.  Cranial nerves: Pupils are sluggishly reactive to light. Funduscopic exam status post cataract(  2011 )  evidence of pallor or edema.  Extraocular movements  in vertical and horizontal planes intact and without nystagmus. Visual fields by finger perimetry are intact. Hearing to finger rub intact.  Facial sensation intact to fine touch. Facial motor strength is symmetric and tongue and uvula move midline.  Motor exam:   Normal tone and normal muscle bulk and symmetric normal strength in all extremities.  Sensory:  Fine touch, pinprick and  vibration were tested in all extremities. Proprioception is tested in the upper extremities only.  Coordination: Rapid alternating movements in the fingers/hands is tested and normal. Finger-to-nose maneuver tested and normal without evidence of ataxia, dysmetria or tremor.  Gait and station: Patient walks without assistive device and is able and assisted stool climb up to the exam table. Strength within normal limits. Stance is wider based.  Tandem gait is significantly impaired  ,  turns with 5 Steps- turns are highly  fragmented.  .   Deep tendon reflexes:  in the  upper and lower extremities are symmetric, patella 2 plus but achilles reflexes are attenuated.  She lost one toe.  Babinski maneuver response is downgoing.   Assessment:   Alzheimer's and  microvascular Dementia;with exacerbation from DM .  After having better control of the blood glucose she has been declining much slower.   physical and neurologic examination, review of laboratory studies, imaging,  and pre-existing records, assessment is that of progressive dementia without behavior factors.  Short term memory loss dominated, likely small vessel disease and alzheimer's. No evidence of parkinsonism.   Plan:  Treatment plan and additional workup will be reviewed under Problem List. Continue  Aricept- increased to 28 mg XR  per day. RV with NP  Ward Givens  in 4-6 month with MMSE.

## 2013-05-24 ENCOUNTER — Other Ambulatory Visit: Payer: Self-pay | Admitting: Family Medicine

## 2013-05-24 DIAGNOSIS — N644 Mastodynia: Secondary | ICD-10-CM

## 2013-05-25 NOTE — Progress Notes (Signed)
Subjective:    Patient ID: Marcia [name], female    DOB: [birth year], 79 y.o.   MRN: [id number]  12/19/2012  Follow-up   HPI This 79 y.o. female presents with husband for 72 hour follow-up of LLE cellulitis.  Compliance with Levaquin and Doxycycline.  Denies fever/chills/sweats. Denies drainage.  Denies worsening swelling, redness, pain. Denies change in activity level or appetite.  Sugars have continued to be labile and range from  90-300.    Review of Systems  Constitutional: Negative for fever, chills, diaphoresis, activity change, appetite change and fatigue.  Cardiovascular: Positive for leg swelling.  Endocrine: Negative for polydipsia, polyphagia and polyuria.  Skin: Positive for color change and wound. Negative for pallor and rash.    Past Medical History  Diagnosis Date  . Diabetes mellitus   . Rheumatoid arthritis(714.0)   . Cancer   . Carcinoma     lung cell-stage1  . Colonic polyp   . Neuropathy     peripheral  . Goiter     with hypothyrodism  . Osteopenia   . Hyperlipemia   . Lymph node disorder 10R,11R    excision  . Memory loss    Allergies  Allergen Reactions  . Amoxicillin Nausea And Vomiting  . Cefaclor Nausea And Vomiting  . Hydrocodone Nausea And Vomiting  . Sulfa Antibiotics     " legs give out"   Current Outpatient Prescriptions  Medication Sig Dispense Refill  . aspirin 81 MG tablet Take 81 mg by mouth daily.       Marland Kitchen atorvastatin (LIPITOR) 40 MG tablet Take 40 mg by mouth daily.      . Calcium Carbonate-Vitamin D (CALCIUM-VITAMIN D) 500-200 MG-UNIT per tablet Take 1 tablet by mouth 2 (two) times daily with a meal.      . ezetimibe (ZETIA) 10 MG tablet Take 10 mg by mouth daily.      Marland Kitchen HUMALOG MIX 75/25 KWIKPEN (75-25) 100 UNIT/ML SUSP 26 Units every morning. And take 16 units in pm      . predniSONE (DELTASONE) 5 MG tablet Take 5 mg by mouth daily.      Marland Kitchen donepezil (ARICEPT) 23 MG TABS tablet Take 1 tablet (23 mg total) by mouth at bedtime.   30 tablet  5   No current facility-administered medications for this visit.       Objective:    BP 151/62  Pulse 74  Temp(Src) 97.8 F (36.6 C)  Resp 16  Ht 5\' 5"  (1.651 m)  Wt 151 lb (68.493 kg)  BMI 25.13 kg/m2 Physical Exam  Nursing note and vitals reviewed. Constitutional: She appears well-developed and well-nourished. No distress.  Cardiovascular:  1+ pitting edema LLE distally to mid-calf.  Skin: She is not diaphoretic. There is erythema.  LLE with persistent erythema diffusely distal leg surrounding eschar without active drainage.  Mild TTP.  No streaking.     Results for orders placed in visit on 12/15/12  POCT CBC      Result Value Ref Range   WBC 5.9  4.6 - 10.2 K/uL   Lymph, poc 1.2  0.6 - 3.4   POC LYMPH PERCENT 20.3  10 - 50 %L   MID (cbc) 0.4  0 - 0.9   POC MID % 7.2  0 - 12 %M   POC Granulocyte 4.3  2 - 6.9   Granulocyte percent 72.5  37 - 80 %G   RBC 4.79  4.04 - 5.48 M/uL   Hemoglobin 14.3  12.2 - 16.2 g/dL   HCT, POC 45.5  37.7 - 47.9 %   MCV 95.0  80 - 97 fL   MCH, POC 29.9  27 - 31.2 pg   MCHC 31.4 (*) 31.8 - 35.4 g/dL   RDW, POC 15.0     Platelet Count, POC 184  142 - 424 K/uL   MPV 10.8  0 - 99.8 fL  GLUCOSE, POCT (MANUAL RESULT ENTRY)      Result Value Ref Range   POC Glucose 150 (*) 70 - 99 mg/dl       Assessment & Plan:  Cellulitis of leg, left  1. L leg cellulitis:  Slowly improving; continue current therapy; RTC 48 hours and sooner if acutely worsens.  2.  DMII: uncontrolled; continue to monitor sugars closely.  No orders of the defined types were placed in this encounter.    Return in about 2 days (around 12/21/2012).   Reginia Forts, M.D.  [facility] [address] [place], [place]  [address] [phone] phone [phone] fax

## 2013-05-26 NOTE — Progress Notes (Signed)
Subjective:    Patient ID: Marcia [name], female    DOB: [birth year], 79 y.o.   MRN: [id number]  12/21/2012  Follow-up   HPI This 79 y.o. female presents with husband for evaluation of LLE cellulitis.  Denies fever/chills/sweats.  Denies decrease in activity or appetite.  Leg swelling unchanged.  Leg redness minimally improved. Denies drainage from eschar or wound.  Reports compliance with antibiotics; no side effects to medication.   Review of Systems  Constitutional: Negative for fever, chills, diaphoresis, activity change, appetite change and fatigue.  Skin: Positive for color change and wound. Negative for pallor and rash.    Past Medical History  Diagnosis Date  . Diabetes mellitus   . Rheumatoid arthritis(714.0)   . Cancer   . Carcinoma     lung cell-stage1  . Colonic polyp   . Neuropathy     peripheral  . Goiter     with hypothyrodism  . Osteopenia   . Hyperlipemia   . Lymph node disorder 10R,11R    excision  . Memory loss    Allergies  Allergen Reactions  . Amoxicillin Nausea And Vomiting  . Cefaclor Nausea And Vomiting  . Hydrocodone Nausea And Vomiting  . Sulfa Antibiotics     " legs give out"   Current Outpatient Prescriptions  Medication Sig Dispense Refill  . aspirin 81 MG tablet Take 81 mg by mouth daily.       Marland Kitchen atorvastatin (LIPITOR) 40 MG tablet Take 40 mg by mouth daily.      . Calcium Carbonate-Vitamin D (CALCIUM-VITAMIN D) 500-200 MG-UNIT per tablet Take 1 tablet by mouth 2 (two) times daily with a meal.      . ezetimibe (ZETIA) 10 MG tablet Take 10 mg by mouth daily.      Marland Kitchen HUMALOG MIX 75/25 KWIKPEN (75-25) 100 UNIT/ML SUSP 26 Units every morning. And take 16 units in pm      . predniSONE (DELTASONE) 5 MG tablet Take 5 mg by mouth daily.      Marland Kitchen donepezil (ARICEPT) 23 MG TABS tablet Take 1 tablet (23 mg total) by mouth at bedtime.  30 tablet  5   No current facility-administered medications for this visit.       Objective:    BP 116/72   Pulse 90  Temp(Src) 98.9 F (37.2 C) (Oral)  Resp 17  Ht 5\' 6"  (1.676 m)  Wt 156 lb (70.761 kg)  BMI 25.19 kg/m2  SpO2 94% Physical Exam  Constitutional: She appears well-developed and well-nourished. No distress.  Cardiovascular:  1+ pitting edema LLE distally to mid-calf.  Skin: She is not diaphoretic. There is erythema.  LLE with persistent erythema, mild warmth, tenderness; eschar unchanged without active drainage; minimal erythema surrounding eschar.     Results for orders placed in visit on 12/15/12  POCT CBC      Result Value Ref Range   WBC 5.9  4.6 - 10.2 K/uL   Lymph, poc 1.2  0.6 - 3.4   POC LYMPH PERCENT 20.3  10 - 50 %L   MID (cbc) 0.4  0 - 0.9   POC MID % 7.2  0 - 12 %M   POC Granulocyte 4.3  2 - 6.9   Granulocyte percent 72.5  37 - 80 %G   RBC 4.79  4.04 - 5.48 M/uL   Hemoglobin 14.3  12.2 - 16.2 g/dL   HCT, POC 45.5  37.7 - 47.9 %   MCV 95.0  80 -  97 fL   MCH, POC 29.9  27 - 31.2 pg   MCHC 31.4 (*) 31.8 - 35.4 g/dL   RDW, POC 15.0     Platelet Count, POC 184  142 - 424 K/uL   MPV 10.8  0 - 99.8 fL  GLUCOSE, POCT (MANUAL RESULT ENTRY)      Result Value Ref Range   POC Glucose 150 (*) 70 - 99 mg/dl       Assessment & Plan:  Cellulitis of leg, left - Plan: AMB referral to wound care center  Wound of left leg, sequela - Plan: AMB referral to wound care center  1.  LLE cellulitis: persistent; minimal to no improvement; refer to wound center for further management.  2.  Wound LLE:  Persistent; refer to wound center.   3. DMII: poorly controlled; interfering with wound healing.  Meds ordered this encounter  Medications  . DISCONTD: ciprofloxacin (CIPRO) 500 MG tablet    Sig: Take 1 tablet (500 mg total) by mouth 2 (two) times daily.    Dispense:  20 tablet    Refill:  0    No Follow-up on file.   Reginia Forts, M.D.  [facility] [address] [place], [place]  [address] [phone] phone [phone]  fax

## 2013-06-02 ENCOUNTER — Ambulatory Visit
Admission: RE | Admit: 2013-06-02 | Discharge: 2013-06-02 | Disposition: A | Discharge status: Home / Self Care | Payer: Medicare Other | Source: Ambulatory Visit | Attending: Family Medicine | Admitting: Family Medicine

## 2013-06-02 DIAGNOSIS — N644 Mastodynia: Secondary | ICD-10-CM | POA: Diagnosis not present

## 2013-06-02 DIAGNOSIS — B372 Candidiasis of skin and nail: Secondary | ICD-10-CM | POA: Diagnosis not present

## 2013-07-28 DIAGNOSIS — J449 Chronic obstructive pulmonary disease, unspecified: Secondary | ICD-10-CM | POA: Diagnosis not present

## 2013-07-28 DIAGNOSIS — M949 Disorder of cartilage, unspecified: Secondary | ICD-10-CM | POA: Diagnosis not present

## 2013-07-28 DIAGNOSIS — E1065 Type 1 diabetes mellitus with hyperglycemia: Secondary | ICD-10-CM | POA: Diagnosis not present

## 2013-07-28 DIAGNOSIS — E039 Hypothyroidism, unspecified: Secondary | ICD-10-CM | POA: Diagnosis not present

## 2013-07-28 DIAGNOSIS — E1049 Type 1 diabetes mellitus with other diabetic neurological complication: Secondary | ICD-10-CM | POA: Diagnosis not present

## 2013-07-28 DIAGNOSIS — F068 Other specified mental disorders due to known physiological condition: Secondary | ICD-10-CM | POA: Diagnosis not present

## 2013-07-28 DIAGNOSIS — E11319 Type 2 diabetes mellitus with unspecified diabetic retinopathy without macular edema: Secondary | ICD-10-CM | POA: Diagnosis not present

## 2013-07-28 DIAGNOSIS — E1142 Type 2 diabetes mellitus with diabetic polyneuropathy: Secondary | ICD-10-CM | POA: Diagnosis not present

## 2013-07-28 DIAGNOSIS — E785 Hyperlipidemia, unspecified: Secondary | ICD-10-CM | POA: Diagnosis not present

## 2013-07-28 DIAGNOSIS — M899 Disorder of bone, unspecified: Secondary | ICD-10-CM | POA: Diagnosis not present

## 2013-08-09 ENCOUNTER — Other Ambulatory Visit: Payer: Self-pay

## 2013-08-09 ENCOUNTER — Encounter (HOSPITAL_BASED_OUTPATIENT_CLINIC_OR_DEPARTMENT_OTHER): Payer: Medicare Other | Attending: General Surgery

## 2013-08-09 DIAGNOSIS — Z85118 Personal history of other malignant neoplasm of bronchus and lung: Secondary | ICD-10-CM | POA: Diagnosis not present

## 2013-08-09 DIAGNOSIS — L97209 Non-pressure chronic ulcer of unspecified calf with unspecified severity: Secondary | ICD-10-CM | POA: Diagnosis not present

## 2013-08-09 DIAGNOSIS — S98139A Complete traumatic amputation of one unspecified lesser toe, initial encounter: Secondary | ICD-10-CM | POA: Diagnosis not present

## 2013-08-09 DIAGNOSIS — IMO0002 Reserved for concepts with insufficient information to code with codable children: Secondary | ICD-10-CM | POA: Insufficient documentation

## 2013-08-09 DIAGNOSIS — Z7982 Long term (current) use of aspirin: Secondary | ICD-10-CM | POA: Diagnosis not present

## 2013-08-09 DIAGNOSIS — I10 Essential (primary) hypertension: Secondary | ICD-10-CM | POA: Insufficient documentation

## 2013-08-09 DIAGNOSIS — Z9221 Personal history of antineoplastic chemotherapy: Secondary | ICD-10-CM | POA: Diagnosis not present

## 2013-08-09 DIAGNOSIS — Z794 Long term (current) use of insulin: Secondary | ICD-10-CM | POA: Diagnosis not present

## 2013-08-09 DIAGNOSIS — E1169 Type 2 diabetes mellitus with other specified complication: Secondary | ICD-10-CM | POA: Diagnosis not present

## 2013-08-09 DIAGNOSIS — Z79899 Other long term (current) drug therapy: Secondary | ICD-10-CM | POA: Diagnosis not present

## 2013-08-09 DIAGNOSIS — C4920 Malignant neoplasm of connective and soft tissue of unspecified lower limb, including hip: Secondary | ICD-10-CM | POA: Diagnosis not present

## 2013-08-09 DIAGNOSIS — Z902 Acquired absence of lung [part of]: Secondary | ICD-10-CM | POA: Insufficient documentation

## 2013-08-10 NOTE — Progress Notes (Signed)
[facility]  NAME:  Marcia [name], Marcia [name]                       ACCOUNT NO.:  MEDICAL RECORD NO.:  [id number]      DATE OF BIRTH:  19-Feb-1934  PHYSICIAN:  Judene Companion, M.D.      VISIT DATE:  08/09/2013                                  OFFICE VISIT   HISTORY OF PRESENT ILLNESS:  This is a 80 year old diabetic lady who I have seen many times here in the past for traumatic diabetic ulcers on her legs.  She has type 2 diabetes.  She has had a history of lung cancer in the past and has had a lung resection.  She also had chemotherapy.  In the past, she has had cataracts and multiple foot surgeries.  She had her right second toe amputated because of gangrenous changes.  MEDICINES:  Prednisone 5 mg a day, Humalog 14-16 units twice a day.  She also is on Aricept, calcium, Lipitor, aspirin, and vitamins.  She came today and had a blood pressure of 158/75, pulse 68, and temperature 98.  She weighs 155.  Her blood sugar was 332.  I looked at this wound, which is on the back of her right calf down low and we took a biopsy of it.  I debrided the old infected scab off it and we treated it with silver collagen and wrapped it in a Kerlix.  She will come back in a week and we will see what the biopsy shows at that time.  So, her diagnosis is 1-cm lesion on the back of her right calf, possible diabetic ulcer, possible trauma, possible neoplasm.  Other diagnoses are type 2 diabetes, hypertension, and history of lung cancer.     Judene Companion, M.D.     PP/MEDQ  D:  08/09/2013  T:  08/09/2013  Job:  [id number]

## 2013-08-16 ENCOUNTER — Encounter (HOSPITAL_BASED_OUTPATIENT_CLINIC_OR_DEPARTMENT_OTHER): Payer: Medicare Other | Attending: General Surgery

## 2013-08-16 ENCOUNTER — Other Ambulatory Visit: Payer: Self-pay

## 2013-08-16 DIAGNOSIS — S81009A Unspecified open wound, unspecified knee, initial encounter: Secondary | ICD-10-CM | POA: Diagnosis not present

## 2013-08-16 DIAGNOSIS — C44621 Squamous cell carcinoma of skin of unspecified upper limb, including shoulder: Secondary | ICD-10-CM | POA: Insufficient documentation

## 2013-08-16 DIAGNOSIS — C44721 Squamous cell carcinoma of skin of unspecified lower limb, including hip: Secondary | ICD-10-CM | POA: Insufficient documentation

## 2013-08-16 DIAGNOSIS — S98139A Complete traumatic amputation of one unspecified lesser toe, initial encounter: Secondary | ICD-10-CM | POA: Insufficient documentation

## 2013-08-16 DIAGNOSIS — S81809A Unspecified open wound, unspecified lower leg, initial encounter: Secondary | ICD-10-CM

## 2013-08-16 DIAGNOSIS — S91009A Unspecified open wound, unspecified ankle, initial encounter: Secondary | ICD-10-CM

## 2013-08-16 DIAGNOSIS — X58XXXA Exposure to other specified factors, initial encounter: Secondary | ICD-10-CM | POA: Insufficient documentation

## 2013-08-22 DIAGNOSIS — C44721 Squamous cell carcinoma of skin of unspecified lower limb, including hip: Secondary | ICD-10-CM | POA: Diagnosis not present

## 2013-09-07 ENCOUNTER — Other Ambulatory Visit: Payer: Self-pay | Admitting: Family Medicine

## 2013-09-07 ENCOUNTER — Encounter (HOSPITAL_BASED_OUTPATIENT_CLINIC_OR_DEPARTMENT_OTHER): Payer: Self-pay | Admitting: *Deleted

## 2013-09-07 ENCOUNTER — Encounter (HOSPITAL_BASED_OUTPATIENT_CLINIC_OR_DEPARTMENT_OTHER)
Admission: RE | Admit: 2013-09-07 | Discharge: 2013-09-07 | Disposition: A | Discharge status: Home / Self Care | Payer: Medicare Other | Source: Ambulatory Visit | Attending: Plastic Surgery | Admitting: Plastic Surgery

## 2013-09-07 DIAGNOSIS — E039 Hypothyroidism, unspecified: Secondary | ICD-10-CM | POA: Diagnosis not present

## 2013-09-07 DIAGNOSIS — R413 Other amnesia: Secondary | ICD-10-CM | POA: Diagnosis not present

## 2013-09-07 DIAGNOSIS — G589 Mononeuropathy, unspecified: Secondary | ICD-10-CM | POA: Diagnosis not present

## 2013-09-07 DIAGNOSIS — M069 Rheumatoid arthritis, unspecified: Secondary | ICD-10-CM | POA: Diagnosis not present

## 2013-09-07 DIAGNOSIS — Z01812 Encounter for preprocedural laboratory examination: Secondary | ICD-10-CM | POA: Diagnosis not present

## 2013-09-07 DIAGNOSIS — Z860100 Personal history of colon polyps, unspecified: Secondary | ICD-10-CM | POA: Diagnosis not present

## 2013-09-07 DIAGNOSIS — M899 Disorder of bone, unspecified: Secondary | ICD-10-CM | POA: Diagnosis not present

## 2013-09-07 DIAGNOSIS — M949 Disorder of cartilage, unspecified: Secondary | ICD-10-CM | POA: Diagnosis not present

## 2013-09-07 DIAGNOSIS — Z85118 Personal history of other malignant neoplasm of bronchus and lung: Secondary | ICD-10-CM | POA: Diagnosis not present

## 2013-09-07 DIAGNOSIS — E119 Type 2 diabetes mellitus without complications: Secondary | ICD-10-CM | POA: Diagnosis not present

## 2013-09-07 DIAGNOSIS — L570 Actinic keratosis: Secondary | ICD-10-CM | POA: Diagnosis not present

## 2013-09-07 DIAGNOSIS — Z902 Acquired absence of lung [part of]: Secondary | ICD-10-CM | POA: Diagnosis not present

## 2013-09-07 DIAGNOSIS — N644 Mastodynia: Secondary | ICD-10-CM

## 2013-09-07 DIAGNOSIS — Z87891 Personal history of nicotine dependence: Secondary | ICD-10-CM | POA: Diagnosis not present

## 2013-09-07 DIAGNOSIS — C44721 Squamous cell carcinoma of skin of unspecified lower limb, including hip: Secondary | ICD-10-CM | POA: Diagnosis not present

## 2013-09-07 DIAGNOSIS — Z01810 Encounter for preprocedural cardiovascular examination: Secondary | ICD-10-CM | POA: Diagnosis not present

## 2013-09-07 DIAGNOSIS — E785 Hyperlipidemia, unspecified: Secondary | ICD-10-CM | POA: Diagnosis not present

## 2013-09-07 LAB — BASIC METABOLIC PANEL
Anion gap: 7 (ref 5–15)
BUN: 16 mg/dL (ref 6–23)
CALCIUM: 9.6 mg/dL (ref 8.4–10.5)
CO2: 34 meq/L — AB (ref 19–32)
CREATININE: 0.7 mg/dL (ref 0.50–1.10)
Chloride: 103 mEq/L (ref 96–112)
GFR calc non Af Amer: 82 mL/min — ABNORMAL LOW (ref >90)
Glucose, Bld: 82 mg/dL (ref 70–99)
Potassium: 5 mEq/L (ref 3.7–5.3)
Sodium: 144 mEq/L (ref 137–147)

## 2013-09-07 NOTE — Progress Notes (Signed)
Talked with husband who still works as Engineer, maintenance (IT) Wife is alert and oriented-but has memory deficit post chemo-hx lung ca with 2 lobectomies and chemo-doing well with that-he will bring her for bmet-ekg-he says she is competent to sign her permit for surgery

## 2013-09-12 ENCOUNTER — Other Ambulatory Visit: Payer: Self-pay | Admitting: Plastic Surgery

## 2013-09-12 DIAGNOSIS — IMO0002 Reserved for concepts with insufficient information to code with codable children: Secondary | ICD-10-CM

## 2013-09-12 NOTE — H&P (Signed)
Marcia [name] is an 79 y.o. female.   Chief Complaint: squamous cell carcinoma HPI: The patient is a 79 yrs old wf here with her husband for a history and physcial of a lesion on her right shoulder and right leg. She noticed a scab that would not heal. A biopsy was done and showed an invasive squamous cell carcinoma, keratoacanthoma like pattern. She has hyperlipidema and diabetes. Her medications were reviewed and included aricept. The area on the right arm/shoulder is 3 mm in size and is a hole at this time. No periwound infection noted. The right leg posterior lesion is scabbed and spans 4 x 4 cm in size. She has mild swelling of her legs, no sign of infection and significant small varicous veins.  Past Medical History  Diagnosis Date  . Diabetes mellitus   . Rheumatoid arthritis(714.0)   . Cancer   . Carcinoma     lung cell-stage1  . Colonic polyp   . Neuropathy     peripheral  . Goiter     with hypothyrodism  . Osteopenia   . Hyperlipemia   . Lymph node disorder 10R,11R    excision  . Memory loss   . Wears glasses     reading    Past Surgical History  Procedure Laterality Date  . Cesarean section    . Lung surgery Right 2006    recurrance 2009,lower lobe superior segment,resection:2007  . Cataract extraction Bilateral   . Wrist surgery Right   . Foot surgery Right     x3  . Lung surgery Right     secondary lower lobe margin, excision,no tumor indentified    No family history on file. Social History:  reports that she quit smoking about 8 years ago. She has never used smokeless tobacco. She reports that she does not drink alcohol or use illicit drugs.  Allergies:  Allergies  Allergen Reactions  . Amoxicillin Nausea And Vomiting  . Cefaclor Nausea And Vomiting  . Hydrocodone Nausea And Vomiting  . Sulfa Antibiotics     " legs give out"     (Not in a hospital admission)  No results found for this or any previous visit (from the past 48 hour(s)). No results  found.  Review of Systems  Constitutional: Negative.   HENT: Negative.   Eyes: Negative.   Respiratory: Negative.   Cardiovascular: Negative.   Gastrointestinal: Negative.   Genitourinary: Negative.   Musculoskeletal: Negative.   Skin: Negative.   Neurological: Negative.   Psychiatric/Behavioral: Negative.     There were no vitals taken for this visit. Physical Exam  Constitutional: She appears well-developed and well-nourished.  HENT:  Head: Normocephalic and atraumatic.  Eyes: Conjunctivae and EOM are normal. Pupils are equal, round, and reactive to light.  Respiratory: Effort normal.  GI: Soft.  Neurological: She is alert.  Skin: Skin is warm.  Psychiatric: She has a normal mood and affect. Her behavior is normal. Judgment and thought content normal.     Assessment/Plan Plan for excision of right arm and leg squamous cell carcinoma with possible Acell placement.  SANGER,CLAIRE 09/12/2013, 10:32 AM

## 2013-09-13 ENCOUNTER — Encounter (HOSPITAL_BASED_OUTPATIENT_CLINIC_OR_DEPARTMENT_OTHER): Payer: Self-pay

## 2013-09-13 ENCOUNTER — Encounter (HOSPITAL_BASED_OUTPATIENT_CLINIC_OR_DEPARTMENT_OTHER): Payer: Medicare Other | Admitting: Anesthesiology

## 2013-09-13 ENCOUNTER — Ambulatory Visit (HOSPITAL_BASED_OUTPATIENT_CLINIC_OR_DEPARTMENT_OTHER)
Admission: RE | Admit: 2013-09-13 | Discharge: 2013-09-13 | Disposition: A | Discharge status: Home / Self Care | Payer: Medicare Other | Source: Ambulatory Visit | Attending: Plastic Surgery | Admitting: Plastic Surgery

## 2013-09-13 ENCOUNTER — Encounter (HOSPITAL_BASED_OUTPATIENT_CLINIC_OR_DEPARTMENT_OTHER)
Admission: RE | Disposition: A | Discharge status: Home / Self Care | Payer: Self-pay | Source: Ambulatory Visit | Attending: Plastic Surgery

## 2013-09-13 ENCOUNTER — Ambulatory Visit (HOSPITAL_BASED_OUTPATIENT_CLINIC_OR_DEPARTMENT_OTHER): Payer: Medicare Other | Admitting: Anesthesiology

## 2013-09-13 DIAGNOSIS — E119 Type 2 diabetes mellitus without complications: Secondary | ICD-10-CM | POA: Diagnosis not present

## 2013-09-13 DIAGNOSIS — Z860100 Personal history of colon polyps, unspecified: Secondary | ICD-10-CM | POA: Insufficient documentation

## 2013-09-13 DIAGNOSIS — C44721 Squamous cell carcinoma of skin of unspecified lower limb, including hip: Secondary | ICD-10-CM | POA: Diagnosis not present

## 2013-09-13 DIAGNOSIS — M949 Disorder of cartilage, unspecified: Secondary | ICD-10-CM

## 2013-09-13 DIAGNOSIS — Z85118 Personal history of other malignant neoplasm of bronchus and lung: Secondary | ICD-10-CM | POA: Insufficient documentation

## 2013-09-13 DIAGNOSIS — G589 Mononeuropathy, unspecified: Secondary | ICD-10-CM | POA: Insufficient documentation

## 2013-09-13 DIAGNOSIS — L570 Actinic keratosis: Secondary | ICD-10-CM | POA: Insufficient documentation

## 2013-09-13 DIAGNOSIS — E039 Hypothyroidism, unspecified: Secondary | ICD-10-CM | POA: Insufficient documentation

## 2013-09-13 DIAGNOSIS — E785 Hyperlipidemia, unspecified: Secondary | ICD-10-CM | POA: Diagnosis not present

## 2013-09-13 DIAGNOSIS — Z902 Acquired absence of lung [part of]: Secondary | ICD-10-CM | POA: Insufficient documentation

## 2013-09-13 DIAGNOSIS — IMO0002 Reserved for concepts with insufficient information to code with codable children: Secondary | ICD-10-CM

## 2013-09-13 DIAGNOSIS — Z87891 Personal history of nicotine dependence: Secondary | ICD-10-CM | POA: Insufficient documentation

## 2013-09-13 DIAGNOSIS — Z01810 Encounter for preprocedural cardiovascular examination: Secondary | ICD-10-CM | POA: Insufficient documentation

## 2013-09-13 DIAGNOSIS — Z01812 Encounter for preprocedural laboratory examination: Secondary | ICD-10-CM | POA: Insufficient documentation

## 2013-09-13 DIAGNOSIS — R413 Other amnesia: Secondary | ICD-10-CM | POA: Insufficient documentation

## 2013-09-13 DIAGNOSIS — M899 Disorder of bone, unspecified: Secondary | ICD-10-CM | POA: Insufficient documentation

## 2013-09-13 DIAGNOSIS — M069 Rheumatoid arthritis, unspecified: Secondary | ICD-10-CM | POA: Insufficient documentation

## 2013-09-13 HISTORY — PX: LESION REMOVAL: SHX5196

## 2013-09-13 HISTORY — DX: Presence of spectacles and contact lenses: Z97.3

## 2013-09-13 LAB — GLUCOSE, CAPILLARY
Glucose-Capillary: 91 mg/dL (ref 70–99)
Glucose-Capillary: 114 mg/dL — ABNORMAL HIGH (ref 70–99)

## 2013-09-13 LAB — POCT HEMOGLOBIN-HEMACUE: Hemoglobin: 14.2 g/dL (ref 12.0–15.0)

## 2013-09-13 SURGERY — WIDE EXCISION, LESION, UPPER EXTREMITY
Anesthesia: General | Site: Leg Lower | Laterality: Right

## 2013-09-13 MED ORDER — ONDANSETRON HCL 4 MG/2ML IJ SOLN
INTRAMUSCULAR | Status: DC | PRN
  Administered 2013-09-13: 4 mg via INTRAVENOUS

## 2013-09-13 MED ORDER — MIDAZOLAM HCL 2 MG/2ML IJ SOLN: 1.0000 mg | INTRAMUSCULAR | Status: DC | PRN

## 2013-09-13 MED ORDER — FENTANYL CITRATE 0.05 MG/ML IJ SOLN
INTRAMUSCULAR | Status: AC
  Filled 2013-09-13: qty 8

## 2013-09-13 MED ORDER — PROPOFOL 10 MG/ML IV EMUL
INTRAVENOUS | Status: AC
  Filled 2013-09-13: qty 50

## 2013-09-13 MED ORDER — PROPOFOL 10 MG/ML IV BOLUS
INTRAVENOUS | Status: DC | PRN
  Administered 2013-09-13: 150 mg via INTRAVENOUS

## 2013-09-13 MED ORDER — BUPIVACAINE-EPINEPHRINE (PF) 0.5% -1:200000 IJ SOLN
INTRAMUSCULAR | Status: AC
  Filled 2013-09-13: qty 30

## 2013-09-13 MED ORDER — BUPIVACAINE HCL (PF) 0.25 % IJ SOLN
INTRAMUSCULAR | Status: AC
  Filled 2013-09-13: qty 30

## 2013-09-13 MED ORDER — CIPROFLOXACIN IN D5W 400 MG/200ML IV SOLN
400.0000 mg | INTRAVENOUS | Status: AC
  Administered 2013-09-13: 400 mg via INTRAVENOUS

## 2013-09-13 MED ORDER — CIPROFLOXACIN IN D5W 400 MG/200ML IV SOLN
INTRAVENOUS | Status: AC
  Filled 2013-09-13: qty 200

## 2013-09-13 MED ORDER — SUCCINYLCHOLINE CHLORIDE 20 MG/ML IJ SOLN
INTRAMUSCULAR | Status: AC
  Filled 2013-09-13: qty 2

## 2013-09-13 MED ORDER — LACTATED RINGERS IV SOLN
INTRAVENOUS | Status: DC
  Administered 2013-09-13: 08:00:00 via INTRAVENOUS

## 2013-09-13 MED ORDER — BUPIVACAINE-EPINEPHRINE (PF) 0.25% -1:200000 IJ SOLN
INTRAMUSCULAR | Status: AC
  Filled 2013-09-13: qty 30

## 2013-09-13 MED ORDER — BUPIVACAINE-EPINEPHRINE 0.25% -1:200000 IJ SOLN
INTRAMUSCULAR | Status: DC | PRN
  Administered 2013-09-13: 7 mL

## 2013-09-13 MED ORDER — PROPOFOL 10 MG/ML IV BOLUS
INTRAVENOUS | Status: AC
  Filled 2013-09-13: qty 100

## 2013-09-13 MED ORDER — LIDOCAINE-EPINEPHRINE 1 %-1:100000 IJ SOLN
INTRAMUSCULAR | Status: AC
  Filled 2013-09-13: qty 1

## 2013-09-13 MED ORDER — BACITRACIN ZINC 500 UNIT/GM EX OINT
TOPICAL_OINTMENT | CUTANEOUS | Status: AC
  Filled 2013-09-13: qty 1.8

## 2013-09-13 MED ORDER — LIDOCAINE HCL (PF) 1 % IJ SOLN
INTRAMUSCULAR | Status: AC
  Filled 2013-09-13: qty 30

## 2013-09-13 MED ORDER — DEXAMETHASONE SODIUM PHOSPHATE 4 MG/ML IJ SOLN
INTRAMUSCULAR | Status: DC | PRN
  Administered 2013-09-13: 10 mg via INTRAVENOUS

## 2013-09-13 MED ORDER — CIPROFLOXACIN IN D5W 400 MG/200ML IV SOLN
INTRAVENOUS | Status: DC | PRN
  Administered 2013-09-13: 400 mg via INTRAVENOUS

## 2013-09-13 MED ORDER — LIDOCAINE HCL (CARDIAC) 20 MG/ML IV SOLN
INTRAVENOUS | Status: DC | PRN
  Administered 2013-09-13: 40 mg via INTRAVENOUS

## 2013-09-13 MED ORDER — FENTANYL CITRATE 0.05 MG/ML IJ SOLN: 50.0000 ug | INTRAMUSCULAR | Status: DC | PRN

## 2013-09-13 MED ORDER — CIPROFLOXACIN IN D5W 400 MG/200ML IV SOLN: 400.0000 mg | INTRAVENOUS | Status: DC

## 2013-09-13 MED ORDER — MIDAZOLAM HCL 2 MG/2ML IJ SOLN
INTRAMUSCULAR | Status: AC
  Filled 2013-09-13: qty 2

## 2013-09-13 SURGICAL SUPPLY — 110 items
ADH SKN CLS APL DERMABOND .7 (GAUZE/BANDAGES/DRESSINGS)
APL SKNCLS STERI-STRIP NONHPOA (GAUZE/BANDAGES/DRESSINGS)
BAG DECANTER FOR FLEXI CONT (MISCELLANEOUS) IMPLANT
BANDAGE ELASTIC 3 VELCRO ST LF (GAUZE/BANDAGES/DRESSINGS) IMPLANT
BANDAGE ELASTIC 4 VELCRO ST LF (GAUZE/BANDAGES/DRESSINGS) ×3 IMPLANT
BANDAGE ELASTIC 6 VELCRO ST LF (GAUZE/BANDAGES/DRESSINGS) IMPLANT
BENZOIN TINCTURE PRP APPL 2/3 (GAUZE/BANDAGES/DRESSINGS) IMPLANT
BLADE CLIPPER SURG (BLADE) IMPLANT
BLADE HEX COATED 2.75 (ELECTRODE) IMPLANT
BLADE MINI RND TIP GREEN BEAV (BLADE) IMPLANT
BLADE SURG 10 STRL SS (BLADE) IMPLANT
BLADE SURG 15 STRL LF DISP TIS (BLADE) ×2 IMPLANT
BLADE SURG 15 STRL SS (BLADE) ×4
BNDG CMPR 9X4 STRL LF SNTH (GAUZE/BANDAGES/DRESSINGS)
BNDG CMPR MD 5X2 ELC HKLP STRL (GAUZE/BANDAGES/DRESSINGS)
BNDG COHESIVE 4X5 TAN STRL (GAUZE/BANDAGES/DRESSINGS) IMPLANT
BNDG CONFORM 2 STRL LF (GAUZE/BANDAGES/DRESSINGS) IMPLANT
BNDG ELASTIC 2 VLCR STRL LF (GAUZE/BANDAGES/DRESSINGS) IMPLANT
BNDG ESMARK 4X9 LF (GAUZE/BANDAGES/DRESSINGS) IMPLANT
BNDG GAUZE ELAST 4 BULKY (GAUZE/BANDAGES/DRESSINGS) ×3 IMPLANT
CANISTER SUCT 1200ML W/VALVE (MISCELLANEOUS) IMPLANT
CANISTER SUCT 3000ML (MISCELLANEOUS) IMPLANT
CHLORAPREP W/TINT 26ML (MISCELLANEOUS) ×4 IMPLANT
CLOSURE WOUND 1/2 X4 (GAUZE/BANDAGES/DRESSINGS)
CORDS BIPOLAR (ELECTRODE) IMPLANT
COVER MAYO STAND STRL (DRAPES) ×4 IMPLANT
COVER TABLE BACK 60X90 (DRAPES) ×4 IMPLANT
DECANTER SPIKE VIAL GLASS SM (MISCELLANEOUS) ×4 IMPLANT
DERMABOND ADVANCED (GAUZE/BANDAGES/DRESSINGS)
DERMABOND ADVANCED .7 DNX12 (GAUZE/BANDAGES/DRESSINGS) IMPLANT
DRAIN PENROSE 1/2X12 LTX STRL (WOUND CARE) IMPLANT
DRAPE EXTREMITY T 121X128X90 (DRAPE) IMPLANT
DRAPE INCISE IOBAN 66X45 STRL (DRAPES) IMPLANT
DRAPE PED LAPAROTOMY (DRAPES) IMPLANT
DRAPE U-SHAPE 76X120 STRL (DRAPES) IMPLANT
DRSG ADAPTIC 3X8 NADH LF (GAUZE/BANDAGES/DRESSINGS) IMPLANT
DRSG EMULSION OIL 3X3 NADH (GAUZE/BANDAGES/DRESSINGS) IMPLANT
DRSG PAD ABDOMINAL 8X10 ST (GAUZE/BANDAGES/DRESSINGS) IMPLANT
DRSG TEGADERM 2-3/8X2-3/4 SM (GAUZE/BANDAGES/DRESSINGS) IMPLANT
ELECT NDL BLADE 2-5/6 (NEEDLE) ×1 IMPLANT
ELECT NDL TIP 2.8 STRL (NEEDLE) IMPLANT
ELECT NEEDLE BLADE 2-5/6 (NEEDLE) ×4 IMPLANT
ELECT NEEDLE TIP 2.8 STRL (NEEDLE) IMPLANT
ELECT REM PT RETURN 9FT ADLT (ELECTROSURGICAL) ×4
ELECT REM PT RETURN 9FT PED (ELECTROSURGICAL)
ELECTRODE REM PT RETRN 9FT PED (ELECTROSURGICAL) IMPLANT
ELECTRODE REM PT RTRN 9FT ADLT (ELECTROSURGICAL) ×2 IMPLANT
GAUZE SPONGE 4X4 12PLY STRL (GAUZE/BANDAGES/DRESSINGS) ×4 IMPLANT
GAUZE XEROFORM 1X8 LF (GAUZE/BANDAGES/DRESSINGS) IMPLANT
GLOVE BIO SURGEON STRL SZ 6.5 (GLOVE) ×6 IMPLANT
GLOVE BIO SURGEONS STRL SZ 6.5 (GLOVE) ×2
GOWN STRL REUS W/ TWL LRG LVL3 (GOWN DISPOSABLE) ×4 IMPLANT
GOWN STRL REUS W/TWL LRG LVL3 (GOWN DISPOSABLE) ×8
HANDPIECE INTERPULSE COAX TIP (DISPOSABLE)
IV NS IRRIG 3000ML ARTHROMATIC (IV SOLUTION) IMPLANT
MANIFOLD NEPTUNE II (INSTRUMENTS) IMPLANT
MATRIX SURGICAL PSM 7X10CM (Tissue) ×3 IMPLANT
MICROMATRIX 500MG (Tissue) ×4 IMPLANT
NEEDLE 27GAX1X1/2 (NEEDLE) IMPLANT
NEEDLE DISP 1 X30G (MISCELLANEOUS) ×4 IMPLANT
NS IRRIG 1000ML POUR BTL (IV SOLUTION) ×4 IMPLANT
PACK BASIN DAY SURGERY FS (CUSTOM PROCEDURE TRAY) ×4 IMPLANT
PADDING CAST ABS 3INX4YD NS (CAST SUPPLIES)
PADDING CAST ABS 4INX4YD NS (CAST SUPPLIES)
PADDING CAST ABS COTTON 3X4 (CAST SUPPLIES) IMPLANT
PADDING CAST ABS COTTON 4X4 ST (CAST SUPPLIES) IMPLANT
PENCIL BUTTON HOLSTER BLD 10FT (ELECTRODE) IMPLANT
SET HNDPC FAN SPRY TIP SCT (DISPOSABLE) IMPLANT
SHEET MEDIUM DRAPE 40X70 STRL (DRAPES) IMPLANT
SLEEVE SCD COMPRESS KNEE MED (MISCELLANEOUS) IMPLANT
SOLUTION PARTIC MCRMTRX 500MG (Tissue) ×1 IMPLANT
SPLINT PLASTER CAST XFAST 3X15 (CAST SUPPLIES) IMPLANT
SPLINT PLASTER XTRA FASTSET 3X (CAST SUPPLIES)
SPONGE GAUZE 2X2 8PLY STER LF (GAUZE/BANDAGES/DRESSINGS)
SPONGE GAUZE 2X2 8PLY STRL LF (GAUZE/BANDAGES/DRESSINGS) IMPLANT
SPONGE GAUZE 4X4 12PLY STER LF (GAUZE/BANDAGES/DRESSINGS) ×6 IMPLANT
SPONGE LAP 18X18 X RAY DECT (DISPOSABLE) ×4 IMPLANT
SPONGE LAP 4X18 X RAY DECT (DISPOSABLE) IMPLANT
STAPLER VISISTAT 35W (STAPLE) IMPLANT
STOCKINETTE 4X48 STRL (DRAPES) IMPLANT
STOCKINETTE 6  STRL (DRAPES) ×2
STOCKINETTE 6 STRL (DRAPES) ×2 IMPLANT
STOCKINETTE IMPERVIOUS LG (DRAPES) IMPLANT
STRIP CLOSURE SKIN 1/2X4 (GAUZE/BANDAGES/DRESSINGS) IMPLANT
SUCTION FRAZIER TIP 10 FR DISP (SUCTIONS) IMPLANT
SURGILUBE 2OZ TUBE FLIPTOP (MISCELLANEOUS) IMPLANT
SUT MNCRL 6-0 UNDY P1 1X18 (SUTURE) IMPLANT
SUT MNCRL AB 4-0 PS2 18 (SUTURE) IMPLANT
SUT MON AB 5-0 P3 18 (SUTURE) IMPLANT
SUT MON AB 5-0 PS2 18 (SUTURE) IMPLANT
SUT MONOCRYL 6-0 P1 1X18 (SUTURE)
SUT PROLENE 5 0 P 3 (SUTURE) IMPLANT
SUT PROLENE 5 0 PS 2 (SUTURE) IMPLANT
SUT PROLENE 6 0 P 1 18 (SUTURE) IMPLANT
SUT SILK 3 0 PS 1 (SUTURE) IMPLANT
SUT VIC AB 3-0 FS2 27 (SUTURE) IMPLANT
SUT VIC AB 5-0 P-3 18X BRD (SUTURE) IMPLANT
SUT VIC AB 5-0 P3 18 (SUTURE)
SUT VIC AB 5-0 PS2 18 (SUTURE) IMPLANT
SUT VICRYL 4-0 PS2 18IN ABS (SUTURE) IMPLANT
SYR BULB 3OZ (MISCELLANEOUS) IMPLANT
SYR BULB IRRIGATION 50ML (SYRINGE) IMPLANT
SYRINGE CONTROL L 12CC (SYRINGE) ×4 IMPLANT
SYRINGE CONTROL LL 12CC (SYRINGE) ×1 IMPLANT
TOWEL OR 17X24 6PK STRL BLUE (TOWEL DISPOSABLE) ×4 IMPLANT
TRAY DSU PREP LF (CUSTOM PROCEDURE TRAY) ×4 IMPLANT
TUBE CONNECTING 20'X1/4 (TUBING)
TUBE CONNECTING 20X1/4 (TUBING) IMPLANT
UNDERPAD 30X30 INCONTINENT (UNDERPADS AND DIAPERS) ×4 IMPLANT
YANKAUER SUCT BULB TIP NO VENT (SUCTIONS) IMPLANT

## 2013-09-13 NOTE — Op Note (Signed)
Operative Note   DATE OF OPERATION: 09/13/2013  LOCATION: [facility]  SURGICAL DIVISION: Plastic Surgery  PREOPERATIVE DIAGNOSES:  Right shoulder and leg skin cancer  POSTOPERATIVE DIAGNOSES:  same  PROCEDURE:  Excision of right shoulder skin lesion with primary closure 5 mm.  Excision of right leg skin cancer with Acell placment (powder 500mg  and 7 x 10 sheet) 3 x 3 cm  SURGEON: [employer], DO  ASSISTANT: Shawn Rayburn, PA  ANESTHESIA:  General.   COMPLICATIONS: None.   INDICATIONS FOR PROCEDURE:  The patient, Marcia [name] is a 79 y.o. female born on 1934/05/04, is here for treatment of skin lesions. MRN: [id number]  CONSENT:  Informed consent was obtained directly from the patient. Risks, benefits and alternatives were fully discussed. Specific risks including but not limited to bleeding, infection, hematoma, seroma, scarring, pain, infection, contracture, asymmetry, wound healing problems, and need for further surgery were all discussed. The patient did have an ample opportunity to have questions answered to satisfaction.   DESCRIPTION OF PROCEDURE:  The patient was taken to the operating room. SCDs were placed and IV antibiotics were given. The patient's operative site was prepped and draped in a sterile fashion. A time out was performed and all information was confirmed to be correct.  General anesthesia was administered.  Local was injected into the skin.  For the shoulder, after waiting several minutes to allow for the epinepherine to work the #15 blade was used to make an eliptical incision around the biopsy site and remove the lesion with a 4 mm border.  The specimen was sent to pathology. The skin was closed with 5-0 Monocryl running suture.  Attention was then turned to the right leg.  The #15 blade was used to excise the lesion with 5 mm border.  A long stitch was placed laterally and a short stitch superiorly.  Acell powdser and sheet were applied  followed by surgical lube.  The kerlex and ace wrap were applied.  The patient tolerated the procedure well.  There were no complications. The patient was allowed to wake from anesthesia, extubated and taken to the recovery room in satisfactory condition.

## 2013-09-13 NOTE — Interval H&P Note (Signed)
History and Physical Interval Note:  09/13/2013 8:16 AM  Marcia [name]  has presented today for surgery, with the diagnosis of Squamous cell carcinoma of skin of lower limb, including hip, right   The various methods of treatment have been discussed with the patient and family. After consideration of risks, benefits and other options for treatment, the patient has consented to  Procedure(s): EXCISION OF RIGHT LEG SQUAMOUS CELL CARCINOMA/RIGHT ARM LESION WITH ACELL (Right) APPLICATION OF A-CELL OF EXTREMITY (Right) as a surgical intervention .  The patient's history has been reviewed, patient examined, no change in status, stable for surgery.  I have reviewed the patient's chart and labs.  Questions were answered to the patient's satisfaction.     SANGER,CLAIRE

## 2013-09-13 NOTE — Discharge Instructions (Signed)
Leave dressing in place and do not remove.  Call your surgeon if you experience:   1.  Fever over 101.0. 2.  Inability to urinate. 3.  Nausea and/or vomiting. 4.  Extreme swelling or bruising at the surgical site. 5.  Continued bleeding from the incision. 6.  Increased pain, redness or drainage from the incision. 7.  Problems related to your pain medication. 8. Any change in color, movement and/or sensation 9. Any problems and/or concerns   Post Anesthesia Home Care Instructions  Activity: Get plenty of rest for the remainder of the day. A responsible adult should stay with you for 24 hours following the procedure.  For the next 24 hours, DO NOT: -Drive a car -Paediatric nurse -Drink alcoholic beverages -Take any medication unless instructed by your physician -Make any legal decisions or sign important papers.  Meals: Start with liquid foods such as gelatin or soup. Progress to regular foods as tolerated. Avoid greasy, spicy, heavy foods. If nausea and/or vomiting occur, drink only clear liquids until the nausea and/or vomiting subsides. Call your physician if vomiting continues.  Special Instructions/Symptoms: Your throat may feel dry or sore from the anesthesia or the breathing tube placed in your throat during surgery. If this causes discomfort, gargle with warm salt water. The discomfort should disappear within 24 hours.

## 2013-09-13 NOTE — Brief Op Note (Signed)
09/13/2013  9:00 AM  PATIENT:  Marcia [name]  79 y.o. female  PRE-OPERATIVE DIAGNOSIS:  Squamous cell carcinoma of skin of lower limb, including hip, right   POST-OPERATIVE DIAGNOSIS:  Squamous cell carcinoma of skin of lower limb, including hip, right  PROCEDURE:  Procedure(s): EXCISION OF RIGHT LEG SQUAMOUS CELL CARCINOMA/RIGHT ARM LESION WITH ACELL (Right)  SURGEON:  Surgeon(s) and Role:    * Hebah Bogosian Sanger, DO - Primary  PHYSICIAN ASSISTANT: Shawn Rayburn, PA  ASSISTANTS: none   ANESTHESIA:   general  EBL:     BLOOD ADMINISTERED:none  DRAINS: none   LOCAL MEDICATIONS USED:  MARCAINE     SPECIMEN:  Source of Specimen:  right shoulder and right leg skin lesions  DISPOSITION OF SPECIMEN:  PATHOLOGY  COUNTS:  YES  TOURNIQUET:  * No tourniquets in log *  DICTATION: .Dragon Dictation  PLAN OF CARE: Discharge to home after PACU  PATIENT DISPOSITION:  PACU - hemodynamically stable.   Delay start of Pharmacological VTE agent (>24hrs) due to surgical blood loss or risk of bleeding: yes

## 2013-09-13 NOTE — Anesthesia Postprocedure Evaluation (Signed)
  Anesthesia Post-op Note  Patient: Marcia [name]  Procedure(s) Performed: Procedure(s): EXCISION OF RIGHT LEG SQUAMOUS CELL CARCINOMA/RIGHT ARM LESION WITH ACELL (Right)  Patient Location: PACU  Anesthesia Type:General  Level of Consciousness: awake, alert  and oriented  Airway and Oxygen Therapy: Patient Spontanous Breathing  Post-op Pain: none  Post-op Assessment: Post-op Vital signs reviewed  Post-op Vital Signs: Reviewed  Last Vitals:  Filed Vitals:   09/13/13 1015  BP: 145/61  Pulse: 66  Temp: 36.6 C  Resp: 16    Complications: No apparent anesthesia complications

## 2013-09-13 NOTE — Anesthesia Procedure Notes (Signed)
Procedure Name: LMA Insertion Date/Time: 09/13/2013 8:32 AM Performed by: Toula Moos L Pre-anesthesia Checklist: Patient identified, Emergency Drugs available, Suction available, Patient being monitored and Timeout performed Patient Re-evaluated:Patient Re-evaluated prior to inductionOxygen Delivery Method: Circle System Utilized Preoxygenation: Pre-oxygenation with 100% oxygen Intubation Type: IV induction Ventilation: Mask ventilation without difficulty LMA: LMA inserted LMA Size: 4.0 Number of attempts: 1 Airway Equipment and Method: bite block Placement Confirmation: positive ETCO2 and breath sounds checked- equal and bilateral Tube secured with: Tape Dental Injury: Teeth and Oropharynx as per pre-operative assessment

## 2013-09-13 NOTE — H&P (View-Only) (Signed)
Marcia [name] is an 79 y.o. female.   Chief Complaint: squamous cell carcinoma HPI: The patient is a 79 yrs old wf here with her husband for a history and physcial of a lesion on her right shoulder and right leg. She noticed a scab that would not heal. A biopsy was done and showed an invasive squamous cell carcinoma, keratoacanthoma like pattern. She has hyperlipidema and diabetes. Her medications were reviewed and included aricept. The area on the right arm/shoulder is 3 mm in size and is a hole at this time. No periwound infection noted. The right leg posterior lesion is scabbed and spans 4 x 4 cm in size. She has mild swelling of her legs, no sign of infection and significant small varicous veins.  Past Medical History  Diagnosis Date  . Diabetes mellitus   . Rheumatoid arthritis(714.0)   . Cancer   . Carcinoma     lung cell-stage1  . Colonic polyp   . Neuropathy     peripheral  . Goiter     with hypothyrodism  . Osteopenia   . Hyperlipemia   . Lymph node disorder 10R,11R    excision  . Memory loss   . Wears glasses     reading    Past Surgical History  Procedure Laterality Date  . Cesarean section    . Lung surgery Right 2006    recurrance 2009,lower lobe superior segment,resection:2007  . Cataract extraction Bilateral   . Wrist surgery Right   . Foot surgery Right     x3  . Lung surgery Right     secondary lower lobe margin, excision,no tumor indentified    No family history on file. Social History:  reports that she quit smoking about 8 years ago. She has never used smokeless tobacco. She reports that she does not drink alcohol or use illicit drugs.  Allergies:  Allergies  Allergen Reactions  . Amoxicillin Nausea And Vomiting  . Cefaclor Nausea And Vomiting  . Hydrocodone Nausea And Vomiting  . Sulfa Antibiotics     " legs give out"     (Not in a hospital admission)  No results found for this or any previous visit (from the past 48 hour(s)). No results  found.  Review of Systems  Constitutional: Negative.   HENT: Negative.   Eyes: Negative.   Respiratory: Negative.   Cardiovascular: Negative.   Gastrointestinal: Negative.   Genitourinary: Negative.   Musculoskeletal: Negative.   Skin: Negative.   Neurological: Negative.   Psychiatric/Behavioral: Negative.     There were no vitals taken for this visit. Physical Exam  Constitutional: She appears well-developed and well-nourished.  HENT:  Head: Normocephalic and atraumatic.  Eyes: Conjunctivae and EOM are normal. Pupils are equal, round, and reactive to light.  Respiratory: Effort normal.  GI: Soft.  Neurological: She is alert.  Skin: Skin is warm.  Psychiatric: She has a normal mood and affect. Her behavior is normal. Judgment and thought content normal.     Assessment/Plan Plan for excision of right arm and leg squamous cell carcinoma with possible Acell placement.  SANGER,Lisett Dirusso 09/12/2013, 79 10:32 AM

## 2013-09-13 NOTE — Anesthesia Preprocedure Evaluation (Signed)
Anesthesia Evaluation  Patient identified by MRN, date of birth, ID band Patient awake    Reviewed: Allergy & Precautions, H&P , NPO status , Patient's Chart, lab work & pertinent test results  Airway Mallampati: I TM Distance: >3 FB Neck ROM: Full    Dental  (+) Teeth Intact, Dental Advisory Given   Pulmonary former smoker,  breath sounds clear to auscultation        Cardiovascular + Peripheral Vascular Disease Rhythm:Regular Rate:Normal     Neuro/Psych    GI/Hepatic   Endo/Other  diabetes, Well Controlled, Type 2, Insulin Dependent  Renal/GU      Musculoskeletal   Abdominal   Peds  Hematology   Anesthesia Other Findings   Reproductive/Obstetrics                           Anesthesia Physical Anesthesia Plan  ASA: III  Anesthesia Plan: General   Post-op Pain Management:    Induction: Intravenous  Airway Management Planned: LMA  Additional Equipment:   Intra-op Plan:   Post-operative Plan: Extubation in OR  Informed Consent: I have reviewed the patients History and Physical, chart, labs and discussed the procedure including the risks, benefits and alternatives for the proposed anesthesia with the patient or authorized representative who has indicated his/her understanding and acceptance.   Dental advisory given  Plan Discussed with: CRNA, Anesthesiologist and Surgeon  Anesthesia Plan Comments:         Anesthesia Quick Evaluation

## 2013-09-13 NOTE — Transfer of Care (Signed)
Immediate Anesthesia Transfer of Care Note  Patient: Marcia [name]  Procedure(s) Performed: Procedure(s): EXCISION OF RIGHT LEG SQUAMOUS CELL CARCINOMA/RIGHT ARM LESION WITH ACELL (Right)  Patient Location: PACU  Anesthesia Type:General  Level of Consciousness: awake, oriented and patient cooperative  Airway & Oxygen Therapy: Patient Spontanous Breathing and Patient connected to face mask oxygen  Post-op Assessment: Report given to PACU RN and Post -op Vital signs reviewed and stable  Post vital signs: Reviewed and stable  Complications: No apparent anesthesia complications

## 2013-09-14 ENCOUNTER — Encounter (HOSPITAL_BASED_OUTPATIENT_CLINIC_OR_DEPARTMENT_OTHER): Payer: Self-pay | Admitting: Plastic Surgery

## 2013-09-14 DIAGNOSIS — Z85828 Personal history of other malignant neoplasm of skin: Secondary | ICD-10-CM | POA: Diagnosis not present

## 2013-09-14 DIAGNOSIS — L821 Other seborrheic keratosis: Secondary | ICD-10-CM | POA: Diagnosis not present

## 2013-09-14 DIAGNOSIS — L570 Actinic keratosis: Secondary | ICD-10-CM | POA: Diagnosis not present

## 2013-09-20 ENCOUNTER — Ambulatory Visit
Admission: RE | Admit: 2013-09-20 | Discharge: 2013-09-20 | Disposition: A | Discharge status: Home / Self Care | Payer: Medicare Other | Source: Ambulatory Visit | Attending: Family Medicine | Admitting: Family Medicine

## 2013-09-20 DIAGNOSIS — M810 Age-related osteoporosis without current pathological fracture: Secondary | ICD-10-CM | POA: Diagnosis not present

## 2013-09-20 DIAGNOSIS — R928 Other abnormal and inconclusive findings on diagnostic imaging of breast: Secondary | ICD-10-CM | POA: Diagnosis not present

## 2013-09-20 DIAGNOSIS — N644 Mastodynia: Secondary | ICD-10-CM

## 2013-09-20 DIAGNOSIS — M069 Rheumatoid arthritis, unspecified: Secondary | ICD-10-CM | POA: Diagnosis not present

## 2013-09-20 DIAGNOSIS — IMO0002 Reserved for concepts with insufficient information to code with codable children: Secondary | ICD-10-CM | POA: Diagnosis not present

## 2013-09-20 DIAGNOSIS — M2550 Pain in unspecified joint: Secondary | ICD-10-CM | POA: Diagnosis not present

## 2013-09-21 ENCOUNTER — Telehealth: Payer: Self-pay | Admitting: *Deleted

## 2013-09-21 NOTE — Telephone Encounter (Signed)
Called patient and spoke with her husband, Wynetta Emery, appointment time was changed to 2 pm on 09/22/13 with NP MM. Patient originally scheduled with NP CM, CM never seen patient before.

## 2013-09-22 ENCOUNTER — Encounter: Payer: Self-pay | Admitting: Adult Health

## 2013-09-22 ENCOUNTER — Ambulatory Visit (INDEPENDENT_AMBULATORY_CARE_PROVIDER_SITE_OTHER): Payer: Medicare Other | Admitting: Adult Health

## 2013-09-22 VITALS — BP 156/68 | HR 71 | Wt 162.0 lb

## 2013-09-22 DIAGNOSIS — F068 Other specified mental disorders due to known physiological condition: Secondary | ICD-10-CM

## 2013-09-22 DIAGNOSIS — F0390 Unspecified dementia without behavioral disturbance: Secondary | ICD-10-CM

## 2013-09-22 MED ORDER — DONEPEZIL HCL 10 MG PO TABS: 10.0000 mg | ORAL_TABLET | Freq: Every day | ORAL | Status: DC

## 2013-09-22 MED ORDER — MEMANTINE HCL 28 X 5 MG & 21 X 10 MG PO TABS: ORAL_TABLET | ORAL | Status: DC

## 2013-09-22 NOTE — Progress Notes (Signed)
PATIENT: Marcia [name] DOB: [birth year]  REASON FOR VISIT: follow up HISTORY FROM: patient  HISTORY OF PRESENT ILLNESS: Marcia [name] is a 79 year old female with a history of dementia. She returns today for follow-up. She currently takes Aricept and is tolerating it well. Aricept was increased to 23 mg but husband states that he did not see the benefit. Patient is unsure whether her memory has improved or declined. Husband states that her memory has continued to decline. She completes all ADLs independently but has to be prompted. Husband states that sometime it is a struggle for her to get ready in time for appointments. Husband states that she does not cook, doesn't drive, but does some simple household chores.  Husband states that she has trouble getting up out of chairs especially in the car. He states he has noticed some changes in her walking but denies falls. Patient denies any numbness/tingling in extremities  HISTORY 05/16/13 (CD): Marcia [name] is here today on her shed you would revisit, 2 days am all CA test scored at 6:30 points she was able to recall the day and the city she is in she had the greatest trouble with the immediate work recall for 5 words. She was able to abstract, able to repeat numbers forward and backwards and to the attention test identifying the letter A.  Mr. Mcintire also states that work finding has been more delayed. Oral I think it is worse now to adjust her Aricept from 10 mg daily to 28 mg daily. I hope she will tolerate the higher doors, which is associated with a higher complaint rate of nausea and anorexia.  In the geriatric depression scale the patient endorsed one of the points.  Marcia [name] is now 79 years old from her last chemotherapy in the treatment of lung cancer of the right lung Norinyl. Please note that the patient had also been evaluated for the possibility of normal pressure hydrocephalus undergoing a diagnostic spinal tap which did not reveal improvement in her  cognitive function morbidity or urinary control.  She is able to hold her breath well over 20 seconds and she has neither ataxia nor dysmetria. Her dementia has progressed, her leg ulcers have meanwhile healed.  History:  The patient was evaluated by spinal tap for a possible NPH, but showed no improvement after the procedure. See 2012 notes. At that time her husband , an Optometrist , had to be in hospital and her diabetes became uncontrolled .  The patient has been followed for over 40 years by Dr. Forde Dandy for her diabetes mellitus.She had originally presented at 79 years of age was confusional spells were possibly attributable to a variable blood glucose levels. Per M.M.S.E test, In November 2013 - 20 points. Since that date , all medication is handled by her husband .  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  REVIEW OF SYSTEMS: Full 14 system review of systems performed and notable only for:  Constitutional: N/A  Eyes: N/A Ear/Nose/Throat: N/A  Skin: wounds Cardiovascular: leg swelling Respiratory: N/A  Gastrointestinal: N/A  Genitourinary: N/A Hematology/Lymphatic: N/A  Endocrine: excessive eating Musculoskeletal:N/A  Allergy/Immunology: N/A  Neurological: memory loss Psychiatric: N/A Sleep: insomnia   ALLERGIES: Allergies  Allergen Reactions  . Amoxicillin Nausea And Vomiting  . Cefaclor Nausea And Vomiting  . Hydrocodone Nausea And Vomiting  . Sulfa Antibiotics     " legs give out"    HOME MEDICATIONS: Outpatient  Prescriptions Prior to Visit  Medication Sig Dispense Refill  . aspirin 81 MG tablet Take 81 mg by mouth daily.       Marland Kitchen atorvastatin (LIPITOR) 40 MG tablet Take 40 mg by mouth daily.      Marland Kitchen donepezil (ARICEPT) 23 MG TABS tablet Take 1 tablet (23 mg total) by mouth at bedtime.  30 tablet  5  . ezetimibe (ZETIA) 10 MG tablet Take 10 mg by mouth daily.      Marland Kitchen HUMALOG MIX 75/25 KWIKPEN (75-25) 100 UNIT/ML SUSP 24 Units  every morning. And take 34 units in pm      . omeprazole (PRILOSEC) 20 MG capsule Take 20 mg by mouth daily.      . predniSONE (DELTASONE) 5 MG tablet Take 5 mg by mouth daily.      . Calcium Carbonate-Vitamin D (CALCIUM-VITAMIN D) 500-200 MG-UNIT per tablet Take 1 tablet by mouth 2 (two) times daily with a meal.       No facility-administered medications prior to visit.    PAST MEDICAL HISTORY: Past Medical History  Diagnosis Date  . Diabetes mellitus   . Rheumatoid arthritis(714.0)   . Cancer   . Carcinoma     lung cell-stage1  . Colonic polyp   . Neuropathy     peripheral  . Goiter     with hypothyrodism  . Osteopenia   . Hyperlipemia   . Lymph node disorder 10R,11R    excision  . Memory loss   . Wears glasses     reading    PAST SURGICAL HISTORY: Past Surgical History  Procedure Laterality Date  . Cesarean section    . Lung surgery Right 2006    recurrance 2009,lower lobe superior segment,resection:2007  . Cataract extraction Bilateral   . Wrist surgery Right   . Foot surgery Right     x3  . Lung surgery Right     secondary lower lobe margin, excision,no tumor indentified  . Lesion removal Right 09/13/2013    Procedure: EXCISION OF RIGHT LEG SQUAMOUS CELL CARCINOMA/RIGHT ARM LESION WITH ACELL;  Surgeon: Theodoro Kos, DO;  Location: [facility];  Service: Plastics;  Laterality: Right;    FAMILY HISTORY: History reviewed. No pertinent family history.  SOCIAL HISTORY: History   Social History  . Marital Status: Married    Spouse Name: Wynetta Emery    Number of Children: 3  . Years of Education: College   Occupational History  . Not on file.   Social History Main Topics  . Smoking status: Former Smoker    Quit date: 01/16/2005  . Smokeless tobacco: Never Used  . Alcohol Use: No  . Drug Use: No  . Sexual Activity: Yes    Birth Control/ Protection: None   Other Topics Concern  . Not on file   Social History Narrative   Pt lives at home  with her spouse Wynetta Emery)   Patient has three children.   Patient is retired.   Patient has a college education.   Patient is right-handed.   Caffeine Use-2 cups daily      PHYSICAL EXAM  Filed Vitals:   09/22/13 1404  BP: 156/68  Pulse: 71  Weight: 162 lb (73.483 kg)   Body mass index is 26.96 kg/(m^2).  Generalized: Well developed, in no acute distress   Neurological examination  Mentation:  Follows all commands speech and language fluent. MMSE 15/30 Cranial nerve II-XII: Pupils were equal round reactive to light. Extraocular movements were full,  visual field were full on confrontational test. Facial sensation and strength were normal. Head turning and shoulder shrug  were normal and symmetric. Motor: The motor testing reveals 5 over 5 strength of all 4 extremities. Good symmetric motor tone is noted throughout.  Sensory: Sensory testing is intact to soft touch on all 4 extremities. Decreased pinprick in stocking pattern in lower extremities. No evidence of extinction is noted.  Coordination: Cerebellar testing reveals good finger-nose-finger and heel-to-shin bilaterally.  Gait and station: She is unable to stand from a sitting position without the use of the arm rest. Gait is slightly wide based. Tandem gait not attempted. Romberg is negative. No drift is seen.  Reflexes: Deep tendon reflexes are symmetric and normal bilaterally.    DIAGNOSTIC DATA (LABS, IMAGING, TESTING) - I reviewed patient records, labs, notes, testing and imaging myself where available.  Lab Results  Component Value Date   WBC 6.6 04/05/2013   HGB 14.2 09/13/2013   HCT 40.1 04/05/2013   MCV 88.4 04/05/2013   PLT 155 04/05/2013      Component Value Date/Time   NA 144 09/07/2013 1455   NA 142 04/05/2013 0917   NA 141 09/04/2011 1011   K 5.0 09/07/2013 1455   K 4.9 04/05/2013 0917   K 4.4 09/04/2011 1011   CL 103 09/07/2013 1455   CL 99 04/04/2012 0921   CL 99 09/04/2011 1011   CO2 34* 09/07/2013 1455    CO2 29 04/05/2013 0917   CO2 28 09/04/2011 1011   GLUCOSE 82 09/07/2013 1455   GLUCOSE 297* 04/05/2013 0917   GLUCOSE 275* 04/04/2012 0921   GLUCOSE 297* 09/04/2011 1011   BUN 16 09/07/2013 1455   BUN 21.8 04/05/2013 0917   BUN 17 09/04/2011 1011   CREATININE 0.70 09/07/2013 1455   CREATININE 0.9 04/05/2013 0917   CREATININE 0.7 09/04/2011 1011   CALCIUM 9.6 09/07/2013 1455   CALCIUM 9.7 04/05/2013 0917   CALCIUM 9.1 09/04/2011 1011   PROT 6.2* 04/05/2013 0917   PROT 6.4 09/04/2011 1011   PROT 6.5 09/01/2011 1758   ALBUMIN 3.3* 04/05/2013 0917   ALBUMIN 3.1* 09/01/2011 1758   AST 20 04/05/2013 0917   AST 21 09/04/2011 1011   AST 11 09/01/2011 1758   ALT 24 04/05/2013 0917   ALT 23 09/04/2011 1011   ALT 8 09/01/2011 1758   ALKPHOS 106 04/05/2013 0917   ALKPHOS 140* 09/04/2011 1011   ALKPHOS 123* 09/01/2011 1758   BILITOT 0.42 04/05/2013 0917   BILITOT 0.50 09/04/2011 1011   BILITOT 0.2* 09/01/2011 1758   GFRNONAA 82* 09/07/2013 1455   GFRAA >90 09/07/2013 1455       ASSESSMENT AND PLAN 79 y.o. year old female  has a past medical history of Diabetes mellitus; Rheumatoid arthritis(714.0); Cancer; Carcinoma; Colonic polyp; Neuropathy; Goiter; Osteopenia; Hyperlipemia; Lymph node disorder (10R,11R); Memory loss; and Wears glasses. here with:  1. Dementia   The patient's memory has continued to decline. She scored a 15/30 on the MMSE. Husband states that he has noticed a decline in her memory as well. She is currently taking Aricept 23 mg daily. Husband states that the cost of this medication significantly increased with the increase in the dosage. He states that he's not really notice a difference between the 10 mg in the 23 mg. At this time we will decreased Aricept to 10 mg and start Namenda. Husband states that patient is having a hard time getting up out of a chair. On exam the  patient was able to use the arm rest of the chair and stand from a sitting position. The patient also had decreased sensation to  pinprick in the lower extremities in a stocking pattern. This could be due to a diabetic neuropathy. She denied any numbness or tingling. If patient's gait worsens or her ability to stand from a sitting position worsens we will need to consider physical therapy. The patient should followup in 6 months or sooner if needed.  Ward Givens, MSN, NP-C 09/22/2013, 2:17 PM [facility] [address], [place],  [address] [phone]  Note: This document was prepared with digital dictation and possible smart phrase technology. Any transcriptional errors that result from this process are unintentional.

## 2013-09-22 NOTE — Patient Instructions (Signed)
Dementia °Dementia is a general term for problems with brain function. A person with dementia has memory loss and a hard time with at least one other brain function such as thinking, speaking, or problem solving. Dementia can affect social functioning, how you do your job, your mood, or your personality. The changes may be hidden for a long time. The earliest forms of this disease are usually not detected by family or friends. °Dementia can be: °· Irreversible. °· Potentially reversible. °· Partially reversible. °· Progressive. This means it can get worse over time. °CAUSES  °Irreversible dementia causes may include: °· Degeneration of brain cells (Alzheimer disease or Lewy body dementia). °· Multiple small strokes (vascular dementia). °· Infection (chronic meningitis or Creutzfeldt-Jakob disease). °· Frontotemporal dementia. This affects younger people, age 40 to 70, compared to those who have Alzheimer disease. °· Dementia associated with other disorders like Parkinson disease, Huntington disease, or HIV-associated dementia. °Potentially or partially reversible dementia causes may include: °· Medicines. °· Metabolic causes such as excessive alcohol intake, vitamin B12 deficiency, or thyroid disease. °· Masses or pressure in the brain such as a tumor, blood clot, or hydrocephalus. °SIGNS AND SYMPTOMS  °Symptoms are often hard to detect. Family members or coworkers may not notice them early in the disease process. Different people with dementia may have different symptoms. Symptoms can include: °· A hard time with memory, especially recent memory. Long-term memory may not be impaired. °· Asking the same question multiple times or forgetting something someone just said. °· A hard time speaking your thoughts or finding certain words. °· A hard time solving problems or performing familiar tasks (such as how to use a telephone). °· Sudden changes in mood. °· Changes in personality, especially increasing moodiness or  mistrust. °· Depression. °· A hard time understanding complex ideas that were never a problem in the past. °DIAGNOSIS  °There are no specific tests for dementia.  °· Your health care provider may recommend a thorough evaluation. This is because some forms of dementia can be reversible. The evaluation will likely include a physical exam and getting a detailed history from you and a family member. The history often gives the best clues and suggestions for a diagnosis. °· Memory testing may be done. A detailed brain function evaluation called neuropsychologic testing may be helpful. °· Lab tests and brain imaging (such as a CT scan or MRI scan) are sometimes important. °· Sometimes observation and re-evaluation over time is very helpful. °TREATMENT  °Treatment depends on the cause.  °· If the problem is a vitamin deficiency, it may be helped or cured with supplements. °· For dementias such as Alzheimer disease, medicines are available to stabilize or slow the course of the disease. There are no cures for this type of dementia. °· Your health care provider can help direct you to groups, organizations, and other health care providers to help with decisions in the care of you or your loved one. °HOME CARE INSTRUCTIONS °The care of individuals with dementia is varied and dependent upon the progression of the dementia. The following suggestions are intended for the person living with, or caring for, the person with dementia. °· Create a safe environment. °· Remove the locks on bathroom doors to prevent the person from accidentally locking himself or herself in. °· Use childproof latches on kitchen cabinets and any place where cleaning supplies, chemicals, or alcohol are kept. °· Use childproof covers in unused electrical outlets. °· Install childproof devices to keep doors and windows   secured.  Remove stove knobs or install safety knobs and an automatic shut-off on the stove.  Lower the temperature on water  heaters.  Label medicines and keep them locked up.  Secure knives, lighters, matches, power tools, and guns, and keep these items out of reach.  Keep the house free from clutter. Remove rugs or anything that might contribute to a fall.  Remove objects that might break and hurt the person.  Make sure lighting is good, both inside and outside.  Install grab rails as needed.  Use a monitoring device to alert you to falls or other needs for help.  Reduce confusion.  Keep familiar objects and people around.  Use night lights or dim lights at night.  Label items or areas.  Use reminders, notes, or directions for daily activities or tasks.  Keep a simple, consistent routine for waking, meals, bathing, dressing, and bedtime.  Create a calm, quiet environment.  Place large clocks and calendars prominently.  Display emergency numbers and home address near all telephones.  Use cues to establish different times of the day. An example is to open curtains to let the natural light in during the day.   Use effective communication.  Choose simple words and short sentences.  Use a gentle, calm tone of voice.  Be careful not to interrupt.  If the person is struggling to find a word or communicate a thought, try to provide the word or thought.  Ask one question at a time. Allow the person ample time to answer questions. Repeat the question again if the person does not respond.  Reduce nighttime restlessness.  Provide a comfortable bed.  Have a consistent nighttime routine.  Ensure a regular walking or physical activity schedule. Involve the person in daily activities as much as possible.  Limit napping during the day.  Limit caffeine.  Attend social events that stimulate rather than overwhelm the senses.  Encourage good nutrition and hydration.  Reduce distractions during meal times and snacks.  Avoid foods that are too hot or too cold.  Monitor chewing and swallowing  ability.  Continue with routine vision, hearing, dental, and medical screenings.  Give medicines only as directed by the health care provider.  Monitor driving abilities. Do not allow the person to drive when safe driving is no longer possible.  Register with an identification program which could provide location assistance in the event of a missing person situation. SEEK MEDICAL CARE IF:   New behavioral problems start such as moodiness, aggressiveness, or seeing things that are not there (hallucinations).  Any new problem with brain function happens. This includes problems with balance, speech, or falling a lot.  Problems with swallowing develop.  Any symptoms of other illness happen. Small changes or worsening in any aspect of brain function can be a sign that the illness is getting worse. It can also be a sign of another medical illness such as infection. Seeing a health care provider right away is important. SEEK IMMEDIATE MEDICAL CARE IF:   A fever develops.  New or worsened confusion develops.  New or worsened sleepiness develops.  Staying awake becomes hard to do. Document Released: 07/29/2000 Document Revised: 06/19/2013 Document Reviewed: 06/30/2010 [facility] Patient Information 2015 [place], [place]. This information is not intended to replace advice given to you by your health care provider. Make sure you discuss any questions you have with your health care provider.

## 2013-09-25 NOTE — Progress Notes (Signed)
I agree with the assessment and plan as directed by NP .The patient is known to me .   Dickey Caamano, MD  

## 2013-09-25 NOTE — Progress Notes (Signed)
I agree with the assessment and plan as directed by NP . The patient has a right lung nodule..  The patient is known to me .   Bleu Minerd, MD

## 2013-10-16 ENCOUNTER — Encounter (HOSPITAL_BASED_OUTPATIENT_CLINIC_OR_DEPARTMENT_OTHER): Payer: Medicare Other | Attending: Plastic Surgery

## 2013-10-16 DIAGNOSIS — C44721 Squamous cell carcinoma of skin of unspecified lower limb, including hip: Secondary | ICD-10-CM | POA: Insufficient documentation

## 2013-10-16 DIAGNOSIS — Y838 Other surgical procedures as the cause of abnormal reaction of the patient, or of later complication, without mention of misadventure at the time of the procedure: Secondary | ICD-10-CM | POA: Insufficient documentation

## 2013-10-16 DIAGNOSIS — T8189XA Other complications of procedures, not elsewhere classified, initial encounter: Secondary | ICD-10-CM | POA: Insufficient documentation

## 2013-10-17 NOTE — Progress Notes (Signed)
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  She is doing extremely well.  Her arm has completely healed from the excision.  The leg is granulating well.  There is no sign of infection.  PHYSICAL EXAMINATION:  GENERAL:  She is alert, oriented, and cooperative, not in any distress.  She is very pleasant. HEENT:  Pupils equal.  Extraocular muscles are intact. RESPIRATORY:  No breathing difficulty.  The area is clean.  A little bit of debridement was done.  There is no sign of infection.  An Endoform was placed and she was given the other remaining portion to be able to change it at home and so we will see her back in 2 weeks.     Theodoro Kos, DO     CS/MEDQ  D:  10/16/2013  T:  10/17/2013  Job:  [id number]

## 2013-10-25 ENCOUNTER — Other Ambulatory Visit: Payer: Self-pay | Admitting: Adult Health

## 2013-10-26 MED ORDER — MEMANTINE HCL 10 MG PO TABS: 10.0000 mg | ORAL_TABLET | Freq: Two times a day (BID) | ORAL | Status: DC

## 2013-10-30 ENCOUNTER — Encounter (HOSPITAL_BASED_OUTPATIENT_CLINIC_OR_DEPARTMENT_OTHER): Payer: Medicare Other | Attending: Plastic Surgery

## 2013-10-30 DIAGNOSIS — D0470 Carcinoma in situ of skin of unspecified lower limb, including hip: Secondary | ICD-10-CM | POA: Diagnosis not present

## 2013-10-30 DIAGNOSIS — R7401 Elevation of levels of liver transaminase levels: Secondary | ICD-10-CM | POA: Diagnosis not present

## 2013-10-30 DIAGNOSIS — E1049 Type 1 diabetes mellitus with other diabetic neurological complication: Secondary | ICD-10-CM | POA: Diagnosis not present

## 2013-10-30 DIAGNOSIS — E1142 Type 2 diabetes mellitus with diabetic polyneuropathy: Secondary | ICD-10-CM | POA: Diagnosis not present

## 2013-10-30 DIAGNOSIS — R0989 Other specified symptoms and signs involving the circulatory and respiratory systems: Secondary | ICD-10-CM | POA: Diagnosis not present

## 2013-10-30 DIAGNOSIS — E538 Deficiency of other specified B group vitamins: Secondary | ICD-10-CM | POA: Diagnosis not present

## 2013-10-30 DIAGNOSIS — F068 Other specified mental disorders due to known physiological condition: Secondary | ICD-10-CM | POA: Diagnosis not present

## 2013-10-30 DIAGNOSIS — I6529 Occlusion and stenosis of unspecified carotid artery: Secondary | ICD-10-CM | POA: Diagnosis not present

## 2013-10-30 DIAGNOSIS — E11319 Type 2 diabetes mellitus with unspecified diabetic retinopathy without macular edema: Secondary | ICD-10-CM | POA: Diagnosis not present

## 2013-10-30 DIAGNOSIS — R7402 Elevation of levels of lactic acid dehydrogenase (LDH): Secondary | ICD-10-CM | POA: Diagnosis not present

## 2013-10-31 DIAGNOSIS — H35049 Retinal micro-aneurysms, unspecified, unspecified eye: Secondary | ICD-10-CM | POA: Diagnosis not present

## 2013-10-31 DIAGNOSIS — H43819 Vitreous degeneration, unspecified eye: Secondary | ICD-10-CM | POA: Diagnosis not present

## 2013-10-31 DIAGNOSIS — H31009 Unspecified chorioretinal scars, unspecified eye: Secondary | ICD-10-CM | POA: Diagnosis not present

## 2013-10-31 DIAGNOSIS — E11339 Type 2 diabetes mellitus with moderate nonproliferative diabetic retinopathy without macular edema: Secondary | ICD-10-CM | POA: Diagnosis not present

## 2013-10-31 DIAGNOSIS — E1139 Type 2 diabetes mellitus with other diabetic ophthalmic complication: Secondary | ICD-10-CM | POA: Diagnosis not present

## 2013-10-31 NOTE — Progress Notes (Signed)
[facility]  NAME:  Marcia [name], Marcia [name]                       ACCOUNT NO.:  MEDICAL RECORD NO.:  [id number]      DATE OF BIRTH:  April 04, 1934  PHYSICIAN:  Theodoro Kos, DO       VISIT DATE:  10/30/2013                                  OFFICE VISIT   The patient is a 79 year old female who is here for followup on her right leg squamous cell carcinoma lesion.  She underwent excision of the lesion with negative margins.  ACell was placed and she is healing it up.  We put Endoform on it last week.  She has got some punctate granulation.  It has filled in very nicely and is epithelializing. There is no sign of infection.  PHYSICAL EXAMINATION:  She does have some pitting edema and is wearing very short socks.  We talked about compression stockings.  We are going to try and get Juxta-Lite for her.  In the meantime, we will continue with the Endoform and an Ace wrap and recommend she change it every other day, and we will see her back in 2 weeks per the husband's request because we are going to the beach.     Theodoro Kos, DO     CS/MEDQ  D:  10/30/2013  T:  10/31/2013  Job:  [id number]

## 2013-11-13 DIAGNOSIS — D0470 Carcinoma in situ of skin of unspecified lower limb, including hip: Secondary | ICD-10-CM | POA: Diagnosis not present

## 2013-11-27 ENCOUNTER — Encounter (HOSPITAL_BASED_OUTPATIENT_CLINIC_OR_DEPARTMENT_OTHER): Payer: Medicare Other | Attending: Plastic Surgery

## 2013-11-27 DIAGNOSIS — L97919 Non-pressure chronic ulcer of unspecified part of right lower leg with unspecified severity: Secondary | ICD-10-CM | POA: Diagnosis not present

## 2013-11-27 DIAGNOSIS — C7651 Malignant neoplasm of right lower limb: Secondary | ICD-10-CM | POA: Diagnosis not present

## 2013-11-27 DIAGNOSIS — I872 Venous insufficiency (chronic) (peripheral): Secondary | ICD-10-CM | POA: Diagnosis not present

## 2013-11-27 NOTE — Progress Notes (Signed)
[facility]  NAME:  Marcia [name], Marcia [name]                  ACCOUNT NO.:  [id number]  MEDICAL RECORD NO.:  [id number]      DATE OF BIRTH:  06-15-1934  PHYSICIAN:  Theodoro Kos, DO       VISIT DATE:  11/27/2013                                  OFFICE VISIT   The patient is a 79 year old female who had a squamous cell carcinoma of her right posterior leg.  It was excised and she had ACell placed.  She had extremely slow healing because of chronic venous insufficiency.  She has been using Endoform on the area with marked improvement.  It is completely filled in.  The periwound area has markedly improved.  There is no sign of infection.  There is no change in her medications or social history.  On exam, she is alert, oriented, cooperative, not in any distress.  She is very pleasant.  Her husband is present.  Her pupils are equal.  Her breathing is unlabored.  Her heart rate is regular.  We placed another Endoform.  She is to change this daily.  She also needs to get socks and bring her Juxta-Lite with her next week and we will see her back in a week.     Theodoro Kos, DO     CS/MEDQ  D:  11/27/2013  T:  11/27/2013  Job:  [id number]

## 2013-12-11 DIAGNOSIS — I872 Venous insufficiency (chronic) (peripheral): Secondary | ICD-10-CM | POA: Diagnosis not present

## 2013-12-11 DIAGNOSIS — L97919 Non-pressure chronic ulcer of unspecified part of right lower leg with unspecified severity: Secondary | ICD-10-CM | POA: Diagnosis not present

## 2013-12-11 DIAGNOSIS — C7651 Malignant neoplasm of right lower limb: Secondary | ICD-10-CM | POA: Diagnosis not present

## 2013-12-12 NOTE — Progress Notes (Signed)
[facility]  NAME:  BRITAINY, KOZUB                  ACCOUNT NO.:  [id number]  MEDICAL RECORD NO.:  [id number]      DATE OF BIRTH:  [birth year]  PHYSICIAN:  Theodoro Kos, DO       VISIT DATE:  12/11/2013                                  OFFICE VISIT   The patient is a 79 year old female who is here for followup on her right lower extremity chronic ulcer from excision of a squamous cell carcinoma.  No change in medications or social history.  On exam, she is alert, oriented, cooperative.  Most of the history is given by her husband that she has a little bit of confusion but she is very pleasant.  She has a little bit of swelling and redness as expected with venous insufficiency of the lower extremities but the wound has healed.  There is no sign of infection and overall she is doing extremely well.  Our recommendation is compression stockings, elevation, multivitamin, vitamin C, zinc, and follow up as needed.     Theodoro Kos, DO     CS/MEDQ  D:  12/11/2013  T:  12/12/2013  Job:  [id number]

## 2013-12-20 DIAGNOSIS — Z23 Encounter for immunization: Secondary | ICD-10-CM | POA: Diagnosis not present

## 2014-01-03 ENCOUNTER — Encounter: Payer: Self-pay | Admitting: Neurology

## 2014-01-09 ENCOUNTER — Encounter: Payer: Self-pay | Admitting: Neurology

## 2014-02-01 DIAGNOSIS — K219 Gastro-esophageal reflux disease without esophagitis: Secondary | ICD-10-CM | POA: Diagnosis not present

## 2014-02-01 DIAGNOSIS — Z23 Encounter for immunization: Secondary | ICD-10-CM | POA: Diagnosis not present

## 2014-02-01 DIAGNOSIS — E039 Hypothyroidism, unspecified: Secondary | ICD-10-CM | POA: Diagnosis not present

## 2014-02-01 DIAGNOSIS — Z1389 Encounter for screening for other disorder: Secondary | ICD-10-CM | POA: Diagnosis not present

## 2014-02-01 DIAGNOSIS — Z0000 Encounter for general adult medical examination without abnormal findings: Secondary | ICD-10-CM | POA: Diagnosis not present

## 2014-02-01 DIAGNOSIS — G4700 Insomnia, unspecified: Secondary | ICD-10-CM | POA: Diagnosis not present

## 2014-02-01 DIAGNOSIS — E780 Pure hypercholesterolemia: Secondary | ICD-10-CM | POA: Diagnosis not present

## 2014-02-01 DIAGNOSIS — Z1211 Encounter for screening for malignant neoplasm of colon: Secondary | ICD-10-CM | POA: Diagnosis not present

## 2014-02-01 DIAGNOSIS — E119 Type 2 diabetes mellitus without complications: Secondary | ICD-10-CM | POA: Diagnosis not present

## 2014-02-10 DIAGNOSIS — R635 Abnormal weight gain: Secondary | ICD-10-CM | POA: Diagnosis not present

## 2014-02-10 DIAGNOSIS — Z85118 Personal history of other malignant neoplasm of bronchus and lung: Secondary | ICD-10-CM | POA: Diagnosis not present

## 2014-02-10 DIAGNOSIS — Z85828 Personal history of other malignant neoplasm of skin: Secondary | ICD-10-CM | POA: Diagnosis not present

## 2014-02-10 DIAGNOSIS — K219 Gastro-esophageal reflux disease without esophagitis: Secondary | ICD-10-CM | POA: Diagnosis not present

## 2014-02-10 DIAGNOSIS — Z794 Long term (current) use of insulin: Secondary | ICD-10-CM | POA: Diagnosis not present

## 2014-02-10 DIAGNOSIS — R600 Localized edema: Secondary | ICD-10-CM | POA: Diagnosis not present

## 2014-02-10 DIAGNOSIS — E1165 Type 2 diabetes mellitus with hyperglycemia: Secondary | ICD-10-CM | POA: Diagnosis not present

## 2014-02-10 DIAGNOSIS — R269 Unspecified abnormalities of gait and mobility: Secondary | ICD-10-CM | POA: Diagnosis not present

## 2014-02-10 DIAGNOSIS — M6281 Muscle weakness (generalized): Secondary | ICD-10-CM | POA: Diagnosis not present

## 2014-02-11 DIAGNOSIS — M6281 Muscle weakness (generalized): Secondary | ICD-10-CM | POA: Diagnosis not present

## 2014-02-11 DIAGNOSIS — R600 Localized edema: Secondary | ICD-10-CM | POA: Diagnosis not present

## 2014-02-11 DIAGNOSIS — K219 Gastro-esophageal reflux disease without esophagitis: Secondary | ICD-10-CM | POA: Diagnosis not present

## 2014-02-11 DIAGNOSIS — R635 Abnormal weight gain: Secondary | ICD-10-CM | POA: Diagnosis not present

## 2014-02-11 DIAGNOSIS — R269 Unspecified abnormalities of gait and mobility: Secondary | ICD-10-CM | POA: Diagnosis not present

## 2014-02-11 DIAGNOSIS — E1165 Type 2 diabetes mellitus with hyperglycemia: Secondary | ICD-10-CM | POA: Diagnosis not present

## 2014-02-13 DIAGNOSIS — R269 Unspecified abnormalities of gait and mobility: Secondary | ICD-10-CM | POA: Diagnosis not present

## 2014-02-13 DIAGNOSIS — R600 Localized edema: Secondary | ICD-10-CM | POA: Diagnosis not present

## 2014-02-13 DIAGNOSIS — K219 Gastro-esophageal reflux disease without esophagitis: Secondary | ICD-10-CM | POA: Diagnosis not present

## 2014-02-13 DIAGNOSIS — M6281 Muscle weakness (generalized): Secondary | ICD-10-CM | POA: Diagnosis not present

## 2014-02-13 DIAGNOSIS — E1165 Type 2 diabetes mellitus with hyperglycemia: Secondary | ICD-10-CM | POA: Diagnosis not present

## 2014-02-13 DIAGNOSIS — R635 Abnormal weight gain: Secondary | ICD-10-CM | POA: Diagnosis not present

## 2014-02-15 DIAGNOSIS — R269 Unspecified abnormalities of gait and mobility: Secondary | ICD-10-CM | POA: Diagnosis not present

## 2014-02-15 DIAGNOSIS — K219 Gastro-esophageal reflux disease without esophagitis: Secondary | ICD-10-CM | POA: Diagnosis not present

## 2014-02-15 DIAGNOSIS — R600 Localized edema: Secondary | ICD-10-CM | POA: Diagnosis not present

## 2014-02-15 DIAGNOSIS — E1165 Type 2 diabetes mellitus with hyperglycemia: Secondary | ICD-10-CM | POA: Diagnosis not present

## 2014-02-15 DIAGNOSIS — R635 Abnormal weight gain: Secondary | ICD-10-CM | POA: Diagnosis not present

## 2014-02-15 DIAGNOSIS — M6281 Muscle weakness (generalized): Secondary | ICD-10-CM | POA: Diagnosis not present

## 2014-02-18 DIAGNOSIS — E1165 Type 2 diabetes mellitus with hyperglycemia: Secondary | ICD-10-CM | POA: Diagnosis not present

## 2014-02-18 DIAGNOSIS — K219 Gastro-esophageal reflux disease without esophagitis: Secondary | ICD-10-CM | POA: Diagnosis not present

## 2014-02-18 DIAGNOSIS — R600 Localized edema: Secondary | ICD-10-CM | POA: Diagnosis not present

## 2014-02-18 DIAGNOSIS — M6281 Muscle weakness (generalized): Secondary | ICD-10-CM | POA: Diagnosis not present

## 2014-02-18 DIAGNOSIS — R269 Unspecified abnormalities of gait and mobility: Secondary | ICD-10-CM | POA: Diagnosis not present

## 2014-02-18 DIAGNOSIS — R635 Abnormal weight gain: Secondary | ICD-10-CM | POA: Diagnosis not present

## 2014-02-19 DIAGNOSIS — R600 Localized edema: Secondary | ICD-10-CM | POA: Diagnosis not present

## 2014-02-19 DIAGNOSIS — R269 Unspecified abnormalities of gait and mobility: Secondary | ICD-10-CM | POA: Diagnosis not present

## 2014-02-19 DIAGNOSIS — R635 Abnormal weight gain: Secondary | ICD-10-CM | POA: Diagnosis not present

## 2014-02-19 DIAGNOSIS — M6281 Muscle weakness (generalized): Secondary | ICD-10-CM | POA: Diagnosis not present

## 2014-02-19 DIAGNOSIS — K219 Gastro-esophageal reflux disease without esophagitis: Secondary | ICD-10-CM | POA: Diagnosis not present

## 2014-02-19 DIAGNOSIS — E1165 Type 2 diabetes mellitus with hyperglycemia: Secondary | ICD-10-CM | POA: Diagnosis not present

## 2014-02-20 DIAGNOSIS — E1165 Type 2 diabetes mellitus with hyperglycemia: Secondary | ICD-10-CM | POA: Diagnosis not present

## 2014-02-20 DIAGNOSIS — M6281 Muscle weakness (generalized): Secondary | ICD-10-CM | POA: Diagnosis not present

## 2014-02-20 DIAGNOSIS — R269 Unspecified abnormalities of gait and mobility: Secondary | ICD-10-CM | POA: Diagnosis not present

## 2014-02-20 DIAGNOSIS — K219 Gastro-esophageal reflux disease without esophagitis: Secondary | ICD-10-CM | POA: Diagnosis not present

## 2014-02-20 DIAGNOSIS — R635 Abnormal weight gain: Secondary | ICD-10-CM | POA: Diagnosis not present

## 2014-02-20 DIAGNOSIS — R600 Localized edema: Secondary | ICD-10-CM | POA: Diagnosis not present

## 2014-02-21 DIAGNOSIS — M6281 Muscle weakness (generalized): Secondary | ICD-10-CM | POA: Diagnosis not present

## 2014-02-21 DIAGNOSIS — E1165 Type 2 diabetes mellitus with hyperglycemia: Secondary | ICD-10-CM | POA: Diagnosis not present

## 2014-02-21 DIAGNOSIS — R269 Unspecified abnormalities of gait and mobility: Secondary | ICD-10-CM | POA: Diagnosis not present

## 2014-02-21 DIAGNOSIS — Z85118 Personal history of other malignant neoplasm of bronchus and lung: Secondary | ICD-10-CM | POA: Diagnosis not present

## 2014-02-21 DIAGNOSIS — Z794 Long term (current) use of insulin: Secondary | ICD-10-CM | POA: Diagnosis not present

## 2014-02-21 DIAGNOSIS — R635 Abnormal weight gain: Secondary | ICD-10-CM | POA: Diagnosis not present

## 2014-02-21 DIAGNOSIS — K219 Gastro-esophageal reflux disease without esophagitis: Secondary | ICD-10-CM | POA: Diagnosis not present

## 2014-02-21 DIAGNOSIS — R600 Localized edema: Secondary | ICD-10-CM | POA: Diagnosis not present

## 2014-02-26 ENCOUNTER — Encounter (HOSPITAL_BASED_OUTPATIENT_CLINIC_OR_DEPARTMENT_OTHER): Payer: Medicare Other | Attending: Plastic Surgery

## 2014-02-26 DIAGNOSIS — E1165 Type 2 diabetes mellitus with hyperglycemia: Secondary | ICD-10-CM | POA: Diagnosis not present

## 2014-02-26 DIAGNOSIS — R269 Unspecified abnormalities of gait and mobility: Secondary | ICD-10-CM | POA: Diagnosis not present

## 2014-02-26 DIAGNOSIS — R635 Abnormal weight gain: Secondary | ICD-10-CM | POA: Diagnosis not present

## 2014-02-26 DIAGNOSIS — M6281 Muscle weakness (generalized): Secondary | ICD-10-CM | POA: Diagnosis not present

## 2014-02-26 DIAGNOSIS — R600 Localized edema: Secondary | ICD-10-CM | POA: Diagnosis not present

## 2014-02-26 DIAGNOSIS — K219 Gastro-esophageal reflux disease without esophagitis: Secondary | ICD-10-CM | POA: Diagnosis not present

## 2014-02-27 DIAGNOSIS — M6281 Muscle weakness (generalized): Secondary | ICD-10-CM | POA: Diagnosis not present

## 2014-02-27 DIAGNOSIS — R600 Localized edema: Secondary | ICD-10-CM | POA: Diagnosis not present

## 2014-02-27 DIAGNOSIS — K219 Gastro-esophageal reflux disease without esophagitis: Secondary | ICD-10-CM | POA: Diagnosis not present

## 2014-02-27 DIAGNOSIS — R269 Unspecified abnormalities of gait and mobility: Secondary | ICD-10-CM | POA: Diagnosis not present

## 2014-02-27 DIAGNOSIS — E1165 Type 2 diabetes mellitus with hyperglycemia: Secondary | ICD-10-CM | POA: Diagnosis not present

## 2014-02-27 DIAGNOSIS — R635 Abnormal weight gain: Secondary | ICD-10-CM | POA: Diagnosis not present

## 2014-02-28 DIAGNOSIS — M6281 Muscle weakness (generalized): Secondary | ICD-10-CM | POA: Diagnosis not present

## 2014-02-28 DIAGNOSIS — E1165 Type 2 diabetes mellitus with hyperglycemia: Secondary | ICD-10-CM | POA: Diagnosis not present

## 2014-02-28 DIAGNOSIS — R600 Localized edema: Secondary | ICD-10-CM | POA: Diagnosis not present

## 2014-02-28 DIAGNOSIS — K219 Gastro-esophageal reflux disease without esophagitis: Secondary | ICD-10-CM | POA: Diagnosis not present

## 2014-02-28 DIAGNOSIS — R635 Abnormal weight gain: Secondary | ICD-10-CM | POA: Diagnosis not present

## 2014-02-28 DIAGNOSIS — R269 Unspecified abnormalities of gait and mobility: Secondary | ICD-10-CM | POA: Diagnosis not present

## 2014-03-04 DIAGNOSIS — R269 Unspecified abnormalities of gait and mobility: Secondary | ICD-10-CM | POA: Diagnosis not present

## 2014-03-04 DIAGNOSIS — K219 Gastro-esophageal reflux disease without esophagitis: Secondary | ICD-10-CM | POA: Diagnosis not present

## 2014-03-04 DIAGNOSIS — E1165 Type 2 diabetes mellitus with hyperglycemia: Secondary | ICD-10-CM | POA: Diagnosis not present

## 2014-03-04 DIAGNOSIS — R600 Localized edema: Secondary | ICD-10-CM | POA: Diagnosis not present

## 2014-03-04 DIAGNOSIS — M6281 Muscle weakness (generalized): Secondary | ICD-10-CM | POA: Diagnosis not present

## 2014-03-04 DIAGNOSIS — R635 Abnormal weight gain: Secondary | ICD-10-CM | POA: Diagnosis not present

## 2014-03-05 DIAGNOSIS — F0390 Unspecified dementia without behavioral disturbance: Secondary | ICD-10-CM | POA: Diagnosis not present

## 2014-03-05 DIAGNOSIS — E538 Deficiency of other specified B group vitamins: Secondary | ICD-10-CM | POA: Diagnosis not present

## 2014-03-05 DIAGNOSIS — E11319 Type 2 diabetes mellitus with unspecified diabetic retinopathy without macular edema: Secondary | ICD-10-CM | POA: Diagnosis not present

## 2014-03-05 DIAGNOSIS — L989 Disorder of the skin and subcutaneous tissue, unspecified: Secondary | ICD-10-CM | POA: Diagnosis not present

## 2014-03-05 DIAGNOSIS — E109 Type 1 diabetes mellitus without complications: Secondary | ICD-10-CM | POA: Diagnosis not present

## 2014-03-05 DIAGNOSIS — E1040 Type 1 diabetes mellitus with diabetic neuropathy, unspecified: Secondary | ICD-10-CM | POA: Diagnosis not present

## 2014-03-05 DIAGNOSIS — R0989 Other specified symptoms and signs involving the circulatory and respiratory systems: Secondary | ICD-10-CM | POA: Diagnosis not present

## 2014-03-05 DIAGNOSIS — R740 Nonspecific elevation of levels of transaminase and lactic acid dehydrogenase [LDH]: Secondary | ICD-10-CM | POA: Diagnosis not present

## 2014-03-06 DIAGNOSIS — K219 Gastro-esophageal reflux disease without esophagitis: Secondary | ICD-10-CM | POA: Diagnosis not present

## 2014-03-06 DIAGNOSIS — E1165 Type 2 diabetes mellitus with hyperglycemia: Secondary | ICD-10-CM | POA: Diagnosis not present

## 2014-03-06 DIAGNOSIS — R269 Unspecified abnormalities of gait and mobility: Secondary | ICD-10-CM | POA: Diagnosis not present

## 2014-03-06 DIAGNOSIS — R600 Localized edema: Secondary | ICD-10-CM | POA: Diagnosis not present

## 2014-03-06 DIAGNOSIS — M6281 Muscle weakness (generalized): Secondary | ICD-10-CM | POA: Diagnosis not present

## 2014-03-06 DIAGNOSIS — R635 Abnormal weight gain: Secondary | ICD-10-CM | POA: Diagnosis not present

## 2014-03-11 DIAGNOSIS — R635 Abnormal weight gain: Secondary | ICD-10-CM | POA: Diagnosis not present

## 2014-03-11 DIAGNOSIS — E1165 Type 2 diabetes mellitus with hyperglycemia: Secondary | ICD-10-CM | POA: Diagnosis not present

## 2014-03-11 DIAGNOSIS — M6281 Muscle weakness (generalized): Secondary | ICD-10-CM | POA: Diagnosis not present

## 2014-03-11 DIAGNOSIS — R600 Localized edema: Secondary | ICD-10-CM | POA: Diagnosis not present

## 2014-03-11 DIAGNOSIS — R269 Unspecified abnormalities of gait and mobility: Secondary | ICD-10-CM | POA: Diagnosis not present

## 2014-03-11 DIAGNOSIS — K219 Gastro-esophageal reflux disease without esophagitis: Secondary | ICD-10-CM | POA: Diagnosis not present

## 2014-03-12 DIAGNOSIS — R635 Abnormal weight gain: Secondary | ICD-10-CM | POA: Diagnosis not present

## 2014-03-12 DIAGNOSIS — K219 Gastro-esophageal reflux disease without esophagitis: Secondary | ICD-10-CM | POA: Diagnosis not present

## 2014-03-12 DIAGNOSIS — R600 Localized edema: Secondary | ICD-10-CM | POA: Diagnosis not present

## 2014-03-12 DIAGNOSIS — R269 Unspecified abnormalities of gait and mobility: Secondary | ICD-10-CM | POA: Diagnosis not present

## 2014-03-12 DIAGNOSIS — M6281 Muscle weakness (generalized): Secondary | ICD-10-CM | POA: Diagnosis not present

## 2014-03-12 DIAGNOSIS — E1165 Type 2 diabetes mellitus with hyperglycemia: Secondary | ICD-10-CM | POA: Diagnosis not present

## 2014-03-19 DIAGNOSIS — Z85828 Personal history of other malignant neoplasm of skin: Secondary | ICD-10-CM | POA: Diagnosis not present

## 2014-03-19 DIAGNOSIS — L821 Other seborrheic keratosis: Secondary | ICD-10-CM | POA: Diagnosis not present

## 2014-03-19 DIAGNOSIS — D1801 Hemangioma of skin and subcutaneous tissue: Secondary | ICD-10-CM | POA: Diagnosis not present

## 2014-03-19 DIAGNOSIS — L812 Freckles: Secondary | ICD-10-CM | POA: Diagnosis not present

## 2014-03-19 DIAGNOSIS — L853 Xerosis cutis: Secondary | ICD-10-CM | POA: Diagnosis not present

## 2014-03-22 DIAGNOSIS — Z7952 Long term (current) use of systemic steroids: Secondary | ICD-10-CM | POA: Diagnosis not present

## 2014-03-22 DIAGNOSIS — M0579 Rheumatoid arthritis with rheumatoid factor of multiple sites without organ or systems involvement: Secondary | ICD-10-CM | POA: Diagnosis not present

## 2014-03-22 DIAGNOSIS — M2550 Pain in unspecified joint: Secondary | ICD-10-CM | POA: Diagnosis not present

## 2014-03-22 DIAGNOSIS — M810 Age-related osteoporosis without current pathological fracture: Secondary | ICD-10-CM | POA: Diagnosis not present

## 2014-03-26 ENCOUNTER — Telehealth: Payer: Self-pay | Admitting: *Deleted

## 2014-03-26 NOTE — Telephone Encounter (Signed)
Called to reschedule for 330 tomorrow afternoon

## 2014-03-27 ENCOUNTER — Ambulatory Visit: Payer: Medicare Other | Admitting: Adult Health

## 2014-04-03 ENCOUNTER — Other Ambulatory Visit (HOSPITAL_BASED_OUTPATIENT_CLINIC_OR_DEPARTMENT_OTHER): Payer: Medicare Other

## 2014-04-03 ENCOUNTER — Ambulatory Visit (HOSPITAL_COMMUNITY)
Admission: RE | Admit: 2014-04-03 | Discharge: 2014-04-03 | Disposition: A | Discharge status: Home / Self Care | Payer: Medicare Other | Source: Ambulatory Visit | Attending: Internal Medicine | Admitting: Internal Medicine

## 2014-04-03 ENCOUNTER — Encounter (HOSPITAL_COMMUNITY): Payer: Self-pay

## 2014-04-03 DIAGNOSIS — C3490 Malignant neoplasm of unspecified part of unspecified bronchus or lung: Secondary | ICD-10-CM | POA: Insufficient documentation

## 2014-04-03 DIAGNOSIS — Z85118 Personal history of other malignant neoplasm of bronchus and lung: Secondary | ICD-10-CM

## 2014-04-03 LAB — COMPREHENSIVE METABOLIC PANEL (CC13)
ALBUMIN: 3.4 g/dL — AB (ref 3.5–5.0)
ALT: 51 U/L (ref 0–55)
AST: 38 U/L — ABNORMAL HIGH (ref 5–34)
Alkaline Phosphatase: 104 U/L (ref 40–150)
Anion Gap: 11 mEq/L (ref 3–11)
BUN: 18.3 mg/dL (ref 7.0–26.0)
CALCIUM: 9.6 mg/dL (ref 8.4–10.4)
CHLORIDE: 104 meq/L (ref 98–109)
CO2: 28 meq/L (ref 22–29)
Creatinine: 0.8 mg/dL (ref 0.6–1.1)
EGFR: 68 mL/min/{1.73_m2} — ABNORMAL LOW (ref >90)
GLUCOSE: 154 mg/dL — AB (ref 70–140)
POTASSIUM: 4.2 meq/L (ref 3.5–5.1)
Sodium: 143 mEq/L (ref 136–145)
Total Bilirubin: 0.57 mg/dL (ref 0.20–1.20)
Total Protein: 6.4 g/dL (ref 6.4–8.3)

## 2014-04-03 LAB — CBC WITH DIFFERENTIAL/PLATELET
BASO%: 0.2 % (ref 0.0–2.0)
Basophils Absolute: 0 10*3/uL (ref 0.0–0.1)
EOS%: 0.9 % (ref 0.0–7.0)
Eosinophils Absolute: 0.1 10*3/uL (ref 0.0–0.5)
HEMATOCRIT: 43.5 % (ref 34.8–46.6)
HGB: 14.3 g/dL (ref 11.6–15.9)
LYMPH%: 19.8 % (ref 14.0–49.7)
MCH: 29.5 pg (ref 25.1–34.0)
MCHC: 32.9 g/dL (ref 31.5–36.0)
MCV: 89.7 fL (ref 79.5–101.0)
MONO#: 0.6 10*3/uL (ref 0.1–0.9)
MONO%: 9.3 % (ref 0.0–14.0)
NEUT#: 4.6 10*3/uL (ref 1.5–6.5)
NEUT%: 69.8 % (ref 38.4–76.8)
Platelets: 182 10*3/uL (ref 145–400)
RBC: 4.85 10*6/uL (ref 3.70–5.45)
RDW: 14.1 % (ref 11.2–14.5)
WBC: 6.6 10*3/uL (ref 3.9–10.3)
lymph#: 1.3 10*3/uL (ref 0.9–3.3)

## 2014-04-10 ENCOUNTER — Telehealth: Payer: Self-pay | Admitting: Internal Medicine

## 2014-04-10 ENCOUNTER — Encounter: Payer: Self-pay | Admitting: Internal Medicine

## 2014-04-10 ENCOUNTER — Ambulatory Visit (HOSPITAL_BASED_OUTPATIENT_CLINIC_OR_DEPARTMENT_OTHER): Payer: Medicare Other | Admitting: Internal Medicine

## 2014-04-10 VITALS — BP 176/68 | HR 74 | Temp 98.5°F | Resp 19 | Ht 65.0 in | Wt 165.2 lb

## 2014-04-10 DIAGNOSIS — Z85118 Personal history of other malignant neoplasm of bronchus and lung: Secondary | ICD-10-CM

## 2014-04-10 DIAGNOSIS — C3431 Malignant neoplasm of lower lobe, right bronchus or lung: Secondary | ICD-10-CM

## 2014-04-10 NOTE — Telephone Encounter (Signed)
Gave avs & calendar for February 2017. °

## 2014-04-10 NOTE — Progress Notes (Signed)
[facility] [phone]   Fax:([phone]) [address], [place], [place] [address]  PRINCIPAL DIAGNOSIS:  1. 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 2. History of stage IA (T1, N0, MX) non-small cell lung cancer diagnosed in January 2007.  PRIOR THERAPY:  1. Status post right upper lobe superior segmentectomy as well as lymph node sampling under the care of Dr. Arlyce Dice on March 09, 2005. 2. Status post right lower lobe superior segmentectomy with lymph node dissection under the care of Dr. Arlyce Dice on June 13, 2007 and the pathology revealed mixed small cell and adenocarcinoma. 3. Status post 4 cycles of adjuvant chemotherapy with carboplatin and etoposide. Last dose was given September 30, 2007.  CURRENT THERAPY: Observation.  INTERVAL HISTORY: Marcia [name] 80 y.o. female returns to the clinic today for annual follow up visit accompanied her husband. The patient is feeling fine today with no specific complaints. She denied having any significant nausea or vomiting. She has no chest pain, shortness of breath, cough or hemoptysis. She had repeat CT scan of the chest performed recently and she is here for evaluation and discussion of her scan results.  MEDICAL HISTORY: Past Medical History  Diagnosis Date  . Diabetes mellitus   . Rheumatoid arthritis(714.0)   . Colonic polyp   . Neuropathy     peripheral  . Goiter     with hypothyrodism  . Osteopenia   . Hyperlipemia   . Lymph node disorder 10R,11R    excision  . Memory loss   . Wears glasses     reading  . Cancer   . Carcinoma     lung cell-stage1    ALLERGIES:  is allergic to amoxicillin; cefaclor; hydrocodone; and sulfa antibiotics.  MEDICATIONS:  Current Outpatient Prescriptions  Medication Sig Dispense Refill  . aspirin 81 MG tablet Take 81 mg by mouth daily.     Marland Kitchen atorvastatin  (LIPITOR) 40 MG tablet Take 40 mg by mouth daily.    Marland Kitchen donepezil (ARICEPT) 10 MG tablet Take 1 tablet (10 mg total) by mouth at bedtime. 30 tablet 6  . ezetimibe (ZETIA) 10 MG tablet Take 10 mg by mouth daily.    Marland Kitchen HUMALOG MIX 75/25 KWIKPEN (75-25) 100 UNIT/ML SUSP 24 Units every morning. And take 34 units in pm    . memantine (NAMENDA) 10 MG tablet Take 1 tablet (10 mg total) by mouth 2 (two) times daily. 60 tablet 6  . omeprazole (PRILOSEC) 20 MG capsule Take 20 mg by mouth daily.    . predniSONE (DELTASONE) 5 MG tablet Take 5 mg by mouth daily.    . Vitamin D, Ergocalciferol, (DRISDOL) 50000 UNITS CAPS capsule Take 1 capsule by mouth once a week.     No current facility-administered medications for this visit.    SURGICAL HISTORY:  Past Surgical History  Procedure Laterality Date  . Cesarean section    . Lung surgery Right 2006    recurrance 2009,lower lobe superior segment,resection:2007  . Cataract extraction Bilateral   . Wrist surgery Right   . Foot surgery Right     x3  . Lung surgery Right     secondary lower lobe margin, excision,no tumor indentified  . Lesion removal Right 09/13/2013    Procedure: EXCISION OF RIGHT LEG SQUAMOUS CELL CARCINOMA/RIGHT ARM LESION WITH ACELL;  Surgeon: Theodoro Kos, DO;  Location:  [facility];  Service: Plastics;  Laterality: Right;    REVIEW OF SYSTEMS:  A comprehensive review of systems was negative.   PHYSICAL EXAMINATION: General appearance: alert, cooperative and no distress Head: Normocephalic, without obvious abnormality, atraumatic Neck: no adenopathy Lymph nodes: Cervical, supraclavicular, and axillary nodes normal. Resp: clear to auscultation bilaterally Cardio: regular rate and rhythm, S1, S2 normal, no murmur, click, rub or gallop GI: soft, non-tender; bowel sounds normal; no masses,  no organomegaly Extremities: extremities normal, atraumatic, no cyanosis or edema  ECOG PERFORMANCE STATUS: 1 - Symptomatic but  completely ambulatory  Blood pressure 176/68, pulse 74, temperature 98.5 F (36.9 C), resp. rate 19, height 5\' 5"  (1.651 m), weight 165 lb 3.2 oz (74.934 kg), SpO2 97 %.  LABORATORY DATA: Lab Results  Component Value Date   WBC 6.6 04/03/2014   HGB 14.3 04/03/2014   HCT 43.5 04/03/2014   MCV 89.7 04/03/2014   PLT 182 04/03/2014      Chemistry      Component Value Date/Time   NA 143 04/03/2014 1027   NA 144 09/07/2013 1455   NA 141 09/04/2011 1011   K 4.2 04/03/2014 1027   K 5.0 09/07/2013 1455   K 4.4 09/04/2011 1011   CL 103 09/07/2013 1455   CL 99 04/04/2012 0921   CL 99 09/04/2011 1011   CO2 28 04/03/2014 1027   CO2 34* 09/07/2013 1455   CO2 28 09/04/2011 1011   BUN 18.3 04/03/2014 1027   BUN 16 09/07/2013 1455   BUN 17 09/04/2011 1011   CREATININE 0.8 04/03/2014 1027   CREATININE 0.70 09/07/2013 1455   CREATININE 0.7 09/04/2011 1011      Component Value Date/Time   CALCIUM 9.6 04/03/2014 1027   CALCIUM 9.6 09/07/2013 1455   CALCIUM 9.1 09/04/2011 1011   ALKPHOS 104 04/03/2014 1027   ALKPHOS 140* 09/04/2011 1011   ALKPHOS 123* 09/01/2011 1758   AST 38* 04/03/2014 1027   AST 21 09/04/2011 1011   AST 11 09/01/2011 1758   ALT 51 04/03/2014 1027   ALT 23 09/04/2011 1011   ALT 8 09/01/2011 1758   BILITOT 0.57 04/03/2014 1027   BILITOT 0.50 09/04/2011 1011   BILITOT 0.2* 09/01/2011 1758       RADIOGRAPHIC STUDIES: Ct Chest Wo Contrast  04/03/2014   CLINICAL DATA:  Followup lung cancer  EXAM: CT CHEST WITHOUT CONTRAST  TECHNIQUE: Multidetector CT imaging of the chest was performed following the standard protocol without IV contrast.  COMPARISON:  04/05/2013  FINDINGS: Mediastinum: Normal heart size. No pericardial effusion. Calcified atherosclerotic disease involves the thoracic aorta as well as the LAD, left circumflex and RCA Coronary arteries. The trachea appears patent and is midline. Normal appearance of the esophagus. No enlarged mediastinal or hilar lymph  nodes.  Lungs/Pleura: Moderate changes of centrilobular emphysema. . Postoperative change in volume loss involving the right lung is again identified and appears unchanged from previous exam. No suspicious pulmonary nodule or mass identified to suggest residual tumor or metastatic diseas. Loculated pleural fluid overlying the posterior right lung is identified and appears unchanged.  Upper Abdomen: The visualized portions of the liver and spleen are normal. The visualized portions of the pancreas and adrenal glands are also normal. Cyst is noted arising from the upper pole the right kidney.  Musculoskeletal: No aggressive lytic or sclerotic bone lesions identified. Chronic right posterior rib deformities are noted.  IMPRESSION: 1. No evidence for residual tumor or metastatic disease. Stable exam. 2.  Atherosclerotic disease including multi vessel Coronary artery calcification.   Electronically Signed   By: Kerby Moors M.D.   On: 04/03/2014 13:36   ASSESSMENT AND PLAN: This is a very pleasant 80 years old white female with recurrent non-small cell lung cancer. She is currently on observation with no evidence for disease recurrence on the recent scan. The patient has no significant complaints today and his recent CT scan of the chest showed no evidence for disease recurrence. I recommended for the patient to continue on observation for now with repeat CT scan of the chest in 1 year. She was advised to call immediately if she has any concerning symptoms in the interval.  The patient voices understanding of current disease status and treatment options and is in agreement with the current care plan.  All questions were answered. The patient knows to call the clinic with any problems, questions or concerns. We can certainly see the patient much sooner if necessary.  Disclaimer: This note was dictated with voice recognition software. Similar sounding words can inadvertently be transcribed and may not be  corrected upon review.

## 2014-04-19 ENCOUNTER — Ambulatory Visit (INDEPENDENT_AMBULATORY_CARE_PROVIDER_SITE_OTHER): Payer: Medicare Other | Admitting: Adult Health

## 2014-04-19 ENCOUNTER — Encounter: Payer: Self-pay | Admitting: Adult Health

## 2014-04-19 ENCOUNTER — Ambulatory Visit: Payer: Medicare Other | Admitting: Adult Health

## 2014-04-19 VITALS — BP 140/58 | HR 65 | Ht 65.0 in | Wt 160.0 lb

## 2014-04-19 DIAGNOSIS — F0150 Vascular dementia without behavioral disturbance: Secondary | ICD-10-CM | POA: Diagnosis not present

## 2014-04-19 NOTE — Progress Notes (Signed)
PATIENT: XOIE KREUSER DOB: 1934/05/13  REASON FOR VISIT: follow up- memory loss HISTORY FROM: patient  HISTORY OF PRESENT ILLNESS:  Ms. Fern is a 80 year old female with a history of dementia. She returns today for follow-up. She is currently taking Aricept and Namenda and tolerating them well. She continues to be on a complete all ADLs independently. However her husband states that she will get very agitated with him if he recommends something that she should wear. He states that she also takes very long time getting ready which tends to make them late for things. The patient no longer operates a motor vehicle. The patient is no longer cooking meals. Reports that the patient has a hard time operating the TV remote. Therefore she usually watches 1 channel all day long. He states for this reason she does not keep up with current events or the date. Husband feels that her memory has remained the same however her attitude has changed. He denies any physical or verbally aggressive behavior. However he states that she is very stubborn and does not want assistance with certain things. The husband does have a personal trainer coming in twice a week for 2 hours to help the patient with strength in the lower extremities. He does state that the patient had a fall yesterday while he was at work. He is unsure where or how the patient fell. She sustained any significant injuries other than lacerations on the knee and bruising. No new neurological complaints. No new medical issues since last seen.   HISTORY 09/22/13: Ms. Osuna is a 80 year old female with a history of dementia. She returns today for follow-up. She currently takes Aricept and is tolerating it well. Aricept was increased to 23 mg but husband states that he did not see the benefit. Patient is unsure whether her memory has improved or declined. Husband states that her memory has continued to decline. She completes all ADLs independently but has to be  prompted. Husband states that sometime it is a struggle for her to get ready in time for appointments. Husband states that she does not cook, doesn't drive, but does some simple household chores. Husband states that she has trouble getting up out of chairs especially in the car. He states he has noticed some changes in her walking but denies falls. Patient denies any numbness/tingling in extremities  HISTORY 05/16/13 (CD): Mrs. Loughney is here today on her shed you would revisit, 2 days am all CA test scored at 6:30 points she was able to recall the day and the city she is in she had the greatest trouble with the immediate work recall for 5 words. She was able to abstract, able to repeat numbers forward and backwards and to the attention test identifying the letter A.  Mr. Spring also states that work finding has been more delayed. Oral I think it is worse now to adjust her Aricept from 10 mg daily to 28 mg daily. I hope she will tolerate the higher doors, which is associated with a higher complaint rate of nausea and anorexia.  In the geriatric depression scale the patient endorsed one of the points.  Mrs. Esqueda is now 80 years old from her last chemotherapy in the treatment of lung cancer of the right lung Norinyl. Please note that the patient had also been evaluated for the possibility of normal pressure hydrocephalus undergoing a diagnostic spinal tap which did not reveal improvement in her cognitive function morbidity or urinary control.  She is able to hold her breath well over 20 seconds and she has neither ataxia nor dysmetria. Her dementia has progressed, her leg ulcers have meanwhile healed.  History:  The patient was evaluated by spinal tap for a possible NPH, but showed no improvement after the procedure. See 2012 notes. At that time her husband , an Optometrist , had to be in hospital and her diabetes became uncontrolled .  The patient has been followed for over 40 years by Dr. Forde Dandy for her  diabetes mellitus.She had originally presented at 80 years of age was confusional spells were possibly attributable to a variable blood glucose levels. Per M.M.S.E test, In November 2013 - 20 points. Since that date , all medication is handled by her husband .  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  REVIEW OF SYSTEMS: Out of a complete 14 system review of symptoms, the patient complains only of the following symptoms, and all other reviewed systems are negative.  Behavior problem, confusion, wounds  ALLERGIES: Allergies  Allergen Reactions  . Amoxicillin Nausea And Vomiting  . Cefaclor Nausea And Vomiting  . Hydrocodone Nausea And Vomiting  . Sulfa Antibiotics     " legs give out"    HOME MEDICATIONS: Outpatient Prescriptions Prior to Visit  Medication Sig Dispense Refill  . aspirin 81 MG tablet Take 81 mg by mouth daily.     Marland Kitchen atorvastatin (LIPITOR) 40 MG tablet Take 40 mg by mouth daily.    Marland Kitchen donepezil (ARICEPT) 10 MG tablet Take 1 tablet (10 mg total) by mouth at bedtime. 30 tablet 6  . ezetimibe (ZETIA) 10 MG tablet Take 10 mg by mouth daily.    Marland Kitchen HUMALOG MIX 75/25 KWIKPEN (75-25) 100 UNIT/ML SUSP 24 Units every morning. And take 34 units in pm    . memantine (NAMENDA) 10 MG tablet Take 1 tablet (10 mg total) by mouth 2 (two) times daily. 60 tablet 6  . omeprazole (PRILOSEC) 20 MG capsule Take 20 mg by mouth daily.    . predniSONE (DELTASONE) 5 MG tablet Take 5 mg by mouth daily.    . Vitamin D, Ergocalciferol, (DRISDOL) 50000 UNITS CAPS capsule Take 1 capsule by mouth once a week.     No facility-administered medications prior to visit.    PAST MEDICAL HISTORY: Past Medical History  Diagnosis Date  . Diabetes mellitus   . Rheumatoid arthritis(714.0)   . Colonic polyp   . Neuropathy     peripheral  . Goiter     with hypothyrodism  . Osteopenia   . Hyperlipemia   . Lymph node disorder 10R,11R    excision  . Memory loss    . Wears glasses     reading  . Cancer   . Carcinoma     lung cell-stage1    PAST SURGICAL HISTORY: Past Surgical History  Procedure Laterality Date  . Cesarean section    . Lung surgery Right 2006    recurrance 2009,lower lobe superior segment,resection:2007  . Cataract extraction Bilateral   . Wrist surgery Right   . Foot surgery Right     x3  . Lung surgery Right     secondary lower lobe margin, excision,no tumor indentified  . Lesion removal Right 09/13/2013    Procedure: EXCISION OF RIGHT LEG SQUAMOUS CELL CARCINOMA/RIGHT ARM LESION WITH ACELL;  Surgeon: Theodoro Kos, DO;  Location: [facility];  Service: Plastics;  Laterality: Right;  FAMILY HISTORY: History reviewed. No pertinent family history.  PHYSICAL EXAM  Filed Vitals:   04/19/14 1016  BP: 140/58  Pulse: 65  Height: 5\' 5"  (1.651 m)  Weight: 160 lb (72.576 kg)   Body mass index is 26.63 kg/(m^2).  Generalized: Well developed, in no acute distress   Neurological examination  Mentation: Alert oriented to time, place, history taking. Follows all commands speech and language fluent Cranial nerve II-XII: Pupils were equal round reactive to light. Extraocular movements were full, visual field were full on confrontational test. Facial sensation and strength were normal. Uvula tongue midline. Head turning and shoulder shrug  were normal and symmetric. Motor: The motor testing reveals 5 over 5 strength of all 4 extremities. Good symmetric motor tone is noted throughout.  Sensory: Sensory testing is intact to soft touch on all 4 extremities. No evidence of extinction is noted.  Coordination: Cerebellar testing reveals good finger-nose-finger and heel-to-shin bilaterally.  Gait and station: Gait is slightly wide-based Tandem gait is unsteady. Romberg is negative. No drift is seen.  Reflexes: Deep tendon reflexes are symmetric and normal bilaterally.   DIAGNOSTIC DATA (LABS, IMAGING, TESTING) - I  reviewed patient records, labs, notes, testing and imaging myself where available.    ASSESSMENT AND PLAN 80 y.o. year old female  has a past medical history of Diabetes mellitus; Rheumatoid arthritis(714.0); Colonic polyp; Neuropathy; Goiter; Osteopenia; Hyperlipemia; Lymph node disorder (10R,11R); Memory loss; Wears glasses; Cancer; and Carcinoma. here with:  1. Dementia  Overall the patient's memory has remained stable. Her MMSE today is 14/30 was previously 15/30. She should continue to take Aricept and Namenda. If her symptoms worsen or she develops new symptoms she she'll let us know. She will follow-up in 6 months or sooner if needed.   I spent 25 minutes with the patient and 50% of this time was spent counseling the patient and her husband on the progression of dementia. I went over the possibility of worsening agitation which may require medication. We also spoke about possibly having an aide in the home to assist the patient with getting ready in the mornings.   Ward Givens, MSN, NP-C 04/19/2014, 10:23 AM [facility] [address], [place], [place] [address] [phone]  Note: This document was prepared with digital dictation and possible smart phrase technology. Any transcriptional errors that result from this process are unintentional.

## 2014-04-19 NOTE — Patient Instructions (Signed)
Continue Aricept and Namenda. If your symptoms worsen or you develop new symptoms please let us know.

## 2014-04-20 NOTE — Progress Notes (Signed)
I agree with the assessment and plan as directed by NP .The patient is known to me .   Latara Micheli, MD  

## 2014-05-01 DIAGNOSIS — E11339 Type 2 diabetes mellitus with moderate nonproliferative diabetic retinopathy without macular edema: Secondary | ICD-10-CM | POA: Diagnosis not present

## 2014-05-18 ENCOUNTER — Other Ambulatory Visit: Payer: Self-pay | Admitting: Adult Health

## 2014-05-23 ENCOUNTER — Other Ambulatory Visit: Payer: Self-pay | Admitting: Adult Health

## 2014-05-31 ENCOUNTER — Other Ambulatory Visit: Payer: Self-pay | Admitting: Neurology

## 2014-06-07 ENCOUNTER — Other Ambulatory Visit: Payer: Self-pay | Admitting: Family Medicine

## 2014-06-07 DIAGNOSIS — R921 Mammographic calcification found on diagnostic imaging of breast: Secondary | ICD-10-CM

## 2014-06-19 DIAGNOSIS — E538 Deficiency of other specified B group vitamins: Secondary | ICD-10-CM | POA: Diagnosis not present

## 2014-06-19 DIAGNOSIS — E785 Hyperlipidemia, unspecified: Secondary | ICD-10-CM | POA: Diagnosis not present

## 2014-06-19 DIAGNOSIS — F0390 Unspecified dementia without behavioral disturbance: Secondary | ICD-10-CM | POA: Diagnosis not present

## 2014-06-19 DIAGNOSIS — E049 Nontoxic goiter, unspecified: Secondary | ICD-10-CM | POA: Diagnosis not present

## 2014-06-19 DIAGNOSIS — C3490 Malignant neoplasm of unspecified part of unspecified bronchus or lung: Secondary | ICD-10-CM | POA: Diagnosis not present

## 2014-06-19 DIAGNOSIS — E1040 Type 1 diabetes mellitus with diabetic neuropathy, unspecified: Secondary | ICD-10-CM | POA: Diagnosis not present

## 2014-06-19 DIAGNOSIS — E1142 Type 2 diabetes mellitus with diabetic polyneuropathy: Secondary | ICD-10-CM | POA: Diagnosis not present

## 2014-06-19 DIAGNOSIS — E039 Hypothyroidism, unspecified: Secondary | ICD-10-CM | POA: Diagnosis not present

## 2014-06-21 ENCOUNTER — Ambulatory Visit
Admission: RE | Admit: 2014-06-21 | Discharge: 2014-06-21 | Disposition: A | Discharge status: Home / Self Care | Payer: Medicare Other | Source: Ambulatory Visit | Attending: Family Medicine | Admitting: Family Medicine

## 2014-06-21 DIAGNOSIS — R921 Mammographic calcification found on diagnostic imaging of breast: Secondary | ICD-10-CM | POA: Diagnosis not present

## 2014-07-25 ENCOUNTER — Other Ambulatory Visit: Payer: Self-pay | Admitting: Family Medicine

## 2014-07-25 DIAGNOSIS — R921 Mammographic calcification found on diagnostic imaging of breast: Secondary | ICD-10-CM

## 2014-07-29 ENCOUNTER — Other Ambulatory Visit: Payer: Self-pay | Admitting: Neurology

## 2014-08-01 ENCOUNTER — Ambulatory Visit
Admission: RE | Admit: 2014-08-01 | Discharge: 2014-08-01 | Disposition: A | Discharge status: Home / Self Care | Payer: Medicare Other | Source: Ambulatory Visit | Attending: Family Medicine | Admitting: Family Medicine

## 2014-08-01 DIAGNOSIS — R921 Mammographic calcification found on diagnostic imaging of breast: Secondary | ICD-10-CM

## 2014-09-20 DIAGNOSIS — M2550 Pain in unspecified joint: Secondary | ICD-10-CM | POA: Diagnosis not present

## 2014-09-20 DIAGNOSIS — F0280 Dementia in other diseases classified elsewhere without behavioral disturbance: Secondary | ICD-10-CM | POA: Diagnosis not present

## 2014-09-20 DIAGNOSIS — Z7952 Long term (current) use of systemic steroids: Secondary | ICD-10-CM | POA: Diagnosis not present

## 2014-09-20 DIAGNOSIS — M0579 Rheumatoid arthritis with rheumatoid factor of multiple sites without organ or systems involvement: Secondary | ICD-10-CM | POA: Diagnosis not present

## 2014-10-24 ENCOUNTER — Encounter: Payer: Self-pay | Admitting: Adult Health

## 2014-10-24 ENCOUNTER — Ambulatory Visit (INDEPENDENT_AMBULATORY_CARE_PROVIDER_SITE_OTHER): Payer: Medicare Other | Admitting: Adult Health

## 2014-10-24 VITALS — BP 180/69 | HR 70 | Ht 65.0 in | Wt 165.0 lb

## 2014-10-24 DIAGNOSIS — F0150 Vascular dementia without behavioral disturbance: Secondary | ICD-10-CM | POA: Diagnosis not present

## 2014-10-24 MED ORDER — DONEPEZIL HCL 23 MG PO TABS: 23.0000 mg | ORAL_TABLET | Freq: Every day | ORAL | Status: DC

## 2014-10-24 NOTE — Progress Notes (Signed)
I agree with the assessment and plan as directed by NP .The patient is known to me .   Hughey Rittenberry, MD  

## 2014-10-24 NOTE — Patient Instructions (Addendum)
Continue Namenda. Increase Aricept to 23 mg daily. If your symptoms worsen or you develop new symptoms please let us know.

## 2014-10-24 NOTE — Progress Notes (Addendum)
PATIENT: Marcia [name] DOB: 1934/08/23  REASON FOR VISIT: follow up-  memory HISTORY FROM: patient  HISTORY OF PRESENT ILLNESS:  Marcia [name] is a 80 year old female with a history of dementia. She returns today for follow-up. She continues to take Aricept and Namenda and tolerates these medications well. She is able to complete all ADLs independently.  Although husband reports that she does require some prompting. She does not operate a motor vehicle.  She no longer cooks. Family did have a physical therapist coming in to work with the patient however the husband did not find this beneficial for her. She continues to have some difficulty with stability and balance. Husband states he notices it the most when she is trying to get in and out of a car. The husband reports that she does have some aggressiveness. However he has learned not to respond to this and the patient will calm down. He does feel that it would be beneficial to have an aide in the home. He states that she tends to forget to put on things like her underwear.  Marcia [name] also reports that she tends to have urinary incontinence in the mornings. He states that he tries to prompt her to go to the bathroom as soon as she gets up but the patient does not  Like to be told what to do therefore she is very resistant. However the patient is very resistant to having someone in the home to help her.  HISTORY  04/19/14: Marcia [name] is a 80 year old female with a history of dementia. She returns today for follow-up. She is currently taking Aricept and Namenda and tolerating them well. She continues to be on a complete all ADLs independently. However her husband states that she will get very agitated with him if he recommends something that she should wear. He states that she also takes very long time getting ready which tends to make them late for things. The patient no longer operates a motor vehicle. The patient is no longer cooking meals. Reports that the  patient has a hard time operating the TV remote. Therefore she usually watches 1 channel all day long. He states for this reason she does not keep up with current events or the date. Husband feels that her memory has remained the same however her attitude has changed. He denies any physical or verbally aggressive behavior. However he states that she is very stubborn and does not want assistance with certain things. The husband does have a personal trainer coming in twice a week for 2 hours to help the patient with strength in the lower extremities. He does state that the patient had a fall yesterday while he was at work. He is unsure where or how the patient fell. She sustained any significant injuries other than lacerations on the knee and bruising. No new neurological complaints. No new medical issues since last seen.  REVIEW OF SYSTEMS: Out of a complete 14 system review of symptoms, the patient complains only of the following symptoms, and all other reviewed systems are negative.   incontinence of bladder  ALLERGIES: Allergies  Allergen Reactions  . Amoxicillin Nausea And Vomiting  . Cefaclor Nausea And Vomiting  . Hydrocodone Nausea And Vomiting  . Sulfa Antibiotics     " legs give out"    HOME MEDICATIONS: Outpatient Prescriptions Prior to Visit  Medication Sig Dispense Refill  . aspirin 81 MG tablet Take 81 mg by mouth daily.     Marland Kitchen atorvastatin (  LIPITOR) 40 MG tablet Take 40 mg by mouth daily.    Marland Kitchen donepezil (ARICEPT) 10 MG tablet TAKE 1 TABLET (10 MG TOTAL) BY MOUTH AT BEDTIME. 30 tablet 6  . donepezil (ARICEPT) 10 MG tablet TAKE 1 TABLET (10 MG TOTAL) BY MOUTH AT BEDTIME. 30 tablet 5  . ezetimibe (ZETIA) 10 MG tablet Take 10 mg by mouth daily.    Marland Kitchen HUMALOG MIX 75/25 KWIKPEN (75-25) 100 UNIT/ML SUSP 24 Units every morning. And take 34 units in pm    . memantine (NAMENDA) 10 MG tablet TAKE 1 TABLET (10 MG TOTAL) BY MOUTH 2 (TWO) TIMES DAILY. 60 tablet 6  . memantine (NAMENDA) 10 MG  tablet TAKE 1 TABLET (10 MG TOTAL) BY MOUTH 2 (TWO) TIMES DAILY. 60 tablet 3  . omeprazole (PRILOSEC) 20 MG capsule Take 20 mg by mouth daily.    . predniSONE (DELTASONE) 5 MG tablet Take 5 mg by mouth daily.    . Vitamin D, Ergocalciferol, (DRISDOL) 50000 UNITS CAPS capsule Take 1 capsule by mouth once a week.     No facility-administered medications prior to visit.    PAST MEDICAL HISTORY: Past Medical History  Diagnosis Date  . Diabetes mellitus   . Rheumatoid arthritis(714.0)   . Colonic polyp   . Neuropathy     peripheral  . Goiter     with hypothyrodism  . Osteopenia   . Hyperlipemia   . Lymph node disorder 10R,11R    excision  . Memory loss   . Wears glasses     reading  . Cancer   . Carcinoma     lung cell-stage1    PAST SURGICAL HISTORY: Past Surgical History  Procedure Laterality Date  . Cesarean section    . Lung surgery Right 2006    recurrance 2009,lower lobe superior segment,resection:2007  . Cataract extraction Bilateral   . Wrist surgery Right   . Foot surgery Right     x3  . Lung surgery Right     secondary lower lobe margin, excision,no tumor indentified  . Lesion removal Right 09/13/2013    Procedure: EXCISION OF RIGHT LEG SQUAMOUS CELL CARCINOMA/RIGHT ARM LESION WITH ACELL;  Surgeon: Theodoro Kos, DO;  Location: [facility];  Service: Plastics;  Laterality: Right;    FAMILY HISTORY: No family history on file.  SOCIAL HISTORY: Social History   Social History  . Marital Status: Married    Spouse Name: Wynetta Emery  . Number of Children: 3  . Years of Education: College   Occupational History  . Not on file.   Social History Main Topics  . Smoking status: Former Smoker    Quit date: 01/16/2005  . Smokeless tobacco: Never Used  . Alcohol Use: No  . Drug Use: No  . Sexual Activity: Yes    Birth Control/ Protection: None   Other Topics Concern  . Not on file   Social History Narrative   Pt lives at home with her spouse  Wynetta Emery)   Patient has three children.   Patient is retired.   Patient has a college education.   Patient is right-handed.   Caffeine Use-2 cups daily      PHYSICAL EXAM  Filed Vitals:   10/24/14 1001  BP: 180/69  Pulse: 70  Height: '5\' 5"'$  (1.651 m)  Weight: 165 lb (74.844 kg)   Body mass index is 27.46 kg/(m^2).   MMSE - Mini Mental State Exam 10/24/2014 04/19/2014  Orientation to time 0 0  Orientation  to Place 0 1  Registration 3 3  Attention/ Calculation 2 3  Recall 0 0  Language- name 2 objects 2 2  Language- repeat 1 0  Language- follow 3 step command 3 3  Language- read & follow direction 1 1  Write a sentence 0 1  Copy design 1 0  Total score 13 14        Generalized: Well developed, in no acute distress   Neurological examination  Mentation: Alert. Follows all commands speech and language fluent. Cranial nerve II-XII:  Extraocular movements were full, visual field were full on confrontational test. Facial sensation and strength were normal. Uvula tongue midline. Head turning and shoulder shrug  were normal and symmetric. Motor: The motor testing reveals 5 over 5 strength of all 4 extremities. Good symmetric motor tone is noted throughout.  Sensory: Sensory testing is intact to soft touch on all 4 extremities. No evidence of extinction is noted.  Coordination: Cerebellar testing reveals good finger-nose-finger and heel-to-shin bilaterally.  Gait and station:  Patient  Requires assistance when standing from a sitting position. Patient's gait is slightly unsteady. Tandem gait unsteady. Romberg is negative.  Reflexes: Deep tendon reflexes are symmetric and normal bilaterally.   DIAGNOSTIC DATA (LABS, IMAGING, TESTING) - I reviewed patient records, labs, notes, testing and imaging myself where available.  Lab Results  Component Value Date   WBC 6.6 04/03/2014   HGB 14.3 04/03/2014   HCT 43.5 04/03/2014   MCV 89.7 04/03/2014   PLT 182 04/03/2014        Component Value Date/Time   NA 143 04/03/2014 1027   NA 144 09/07/2013 1455   NA 141 09/04/2011 1011   K 4.2 04/03/2014 1027   K 5.0 09/07/2013 1455   K 4.4 09/04/2011 1011   CL 103 09/07/2013 1455   CL 99 04/04/2012 0921   CL 99 09/04/2011 1011   CO2 28 04/03/2014 1027   CO2 34* 09/07/2013 1455   CO2 28 09/04/2011 1011   GLUCOSE 154* 04/03/2014 1027   GLUCOSE 82 09/07/2013 1455   GLUCOSE 275* 04/04/2012 0921   GLUCOSE 297* 09/04/2011 1011   BUN 18.3 04/03/2014 1027   BUN 16 09/07/2013 1455   BUN 17 09/04/2011 1011   CREATININE 0.8 04/03/2014 1027   CREATININE 0.70 09/07/2013 1455   CREATININE 0.7 09/04/2011 1011   CALCIUM 9.6 04/03/2014 1027   CALCIUM 9.6 09/07/2013 1455   CALCIUM 9.1 09/04/2011 1011   PROT 6.4 04/03/2014 1027   PROT 6.4 09/04/2011 1011   PROT 6.5 09/01/2011 1758   ALBUMIN 3.4* 04/03/2014 1027   ALBUMIN 3.1* 09/01/2011 1758   AST 38* 04/03/2014 1027   AST 21 09/04/2011 1011   AST 11 09/01/2011 1758   ALT 51 04/03/2014 1027   ALT 23 09/04/2011 1011   ALT 8 09/01/2011 1758   ALKPHOS 104 04/03/2014 1027   ALKPHOS 140* 09/04/2011 1011   ALKPHOS 123* 09/01/2011 1758   BILITOT 0.57 04/03/2014 1027   BILITOT 0.50 09/04/2011 1011   BILITOT 0.2* 09/01/2011 1758   GFRNONAA 82* 09/07/2013 1455   GFRAA >90 09/07/2013 1455   No results found for: CHOL, HDL, LDLCALC, LDLDIRECT, TRIG, CHOLHDL No results found for: HGBA1C No results found for: VITAMINB12 No results found for: TSH    ASSESSMENT AND PLAN 80 y.o. year old female  has a past medical history of Diabetes mellitus; Rheumatoid arthritis(714.0); Colonic polyp; Neuropathy; Goiter; Osteopenia; Hyperlipemia; Lymph node disorder (10R,11R); Memory loss; Wears glasses; Cancer; and Carcinoma.  here with:  1. Dementia   Overall the patient's memory has remained stable. Her MMSE today is 13/ 30 was previously 14/30. She will continue on  Namenda.  She will increase Aricept 23 mg daily. Advised the husband  that common side effect is GI upset. If this occurs they should let me know. I do feel that the patient would benefit from having an aid in the home. I have provided the husband with the number to care patrol specifically Laurey Morale to help him with this process. Patient and her husband advised that if her symptoms worsen or she develops new symptoms she should let us know. She will follow-up in 6 months with Dr. Mechele Claude, MSN, NP-C 10/24/2014, 10:01 AM [facility] [address], [place], [place] [address] [phone]

## 2014-10-25 DIAGNOSIS — E109 Type 1 diabetes mellitus without complications: Secondary | ICD-10-CM | POA: Diagnosis not present

## 2014-10-25 DIAGNOSIS — E049 Nontoxic goiter, unspecified: Secondary | ICD-10-CM | POA: Diagnosis not present

## 2014-10-25 DIAGNOSIS — Z1389 Encounter for screening for other disorder: Secondary | ICD-10-CM | POA: Diagnosis not present

## 2014-10-25 DIAGNOSIS — Z23 Encounter for immunization: Secondary | ICD-10-CM | POA: Diagnosis not present

## 2014-10-25 DIAGNOSIS — E1040 Type 1 diabetes mellitus with diabetic neuropathy, unspecified: Secondary | ICD-10-CM | POA: Diagnosis not present

## 2014-10-25 DIAGNOSIS — E11319 Type 2 diabetes mellitus with unspecified diabetic retinopathy without macular edema: Secondary | ICD-10-CM | POA: Diagnosis not present

## 2014-10-25 DIAGNOSIS — M859 Disorder of bone density and structure, unspecified: Secondary | ICD-10-CM | POA: Diagnosis not present

## 2014-10-25 DIAGNOSIS — F0390 Unspecified dementia without behavioral disturbance: Secondary | ICD-10-CM | POA: Diagnosis not present

## 2014-10-25 DIAGNOSIS — E1142 Type 2 diabetes mellitus with diabetic polyneuropathy: Secondary | ICD-10-CM | POA: Diagnosis not present

## 2014-10-25 DIAGNOSIS — R0989 Other specified symptoms and signs involving the circulatory and respiratory systems: Secondary | ICD-10-CM | POA: Diagnosis not present

## 2014-10-25 DIAGNOSIS — E538 Deficiency of other specified B group vitamins: Secondary | ICD-10-CM | POA: Diagnosis not present

## 2015-01-29 DIAGNOSIS — E113393 Type 2 diabetes mellitus with moderate nonproliferative diabetic retinopathy without macular edema, bilateral: Secondary | ICD-10-CM | POA: Diagnosis not present

## 2015-01-29 DIAGNOSIS — H31009 Unspecified chorioretinal scars, unspecified eye: Secondary | ICD-10-CM | POA: Diagnosis not present

## 2015-01-29 DIAGNOSIS — H35043 Retinal micro-aneurysms, unspecified, bilateral: Secondary | ICD-10-CM | POA: Diagnosis not present

## 2015-02-26 DIAGNOSIS — E784 Other hyperlipidemia: Secondary | ICD-10-CM | POA: Diagnosis not present

## 2015-02-26 DIAGNOSIS — E538 Deficiency of other specified B group vitamins: Secondary | ICD-10-CM | POA: Diagnosis not present

## 2015-02-26 DIAGNOSIS — J449 Chronic obstructive pulmonary disease, unspecified: Secondary | ICD-10-CM | POA: Diagnosis not present

## 2015-02-26 DIAGNOSIS — E1142 Type 2 diabetes mellitus with diabetic polyneuropathy: Secondary | ICD-10-CM | POA: Diagnosis not present

## 2015-02-26 DIAGNOSIS — M069 Rheumatoid arthritis, unspecified: Secondary | ICD-10-CM | POA: Diagnosis not present

## 2015-02-26 DIAGNOSIS — E1040 Type 1 diabetes mellitus with diabetic neuropathy, unspecified: Secondary | ICD-10-CM | POA: Diagnosis not present

## 2015-02-26 DIAGNOSIS — E038 Other specified hypothyroidism: Secondary | ICD-10-CM | POA: Diagnosis not present

## 2015-02-26 DIAGNOSIS — F0390 Unspecified dementia without behavioral disturbance: Secondary | ICD-10-CM | POA: Diagnosis not present

## 2015-03-13 ENCOUNTER — Telehealth: Payer: Self-pay | Admitting: Internal Medicine

## 2015-03-13 NOTE — Telephone Encounter (Signed)
Patient aware of appointment change on 2/28 due to PAL. Patient will call office back if there is a conflict.

## 2015-03-25 DIAGNOSIS — L812 Freckles: Secondary | ICD-10-CM | POA: Diagnosis not present

## 2015-03-25 DIAGNOSIS — Z85828 Personal history of other malignant neoplasm of skin: Secondary | ICD-10-CM | POA: Diagnosis not present

## 2015-03-25 DIAGNOSIS — L821 Other seborrheic keratosis: Secondary | ICD-10-CM | POA: Diagnosis not present

## 2015-03-25 DIAGNOSIS — D692 Other nonthrombocytopenic purpura: Secondary | ICD-10-CM | POA: Diagnosis not present

## 2015-03-25 DIAGNOSIS — L853 Xerosis cutis: Secondary | ICD-10-CM | POA: Diagnosis not present

## 2015-03-25 DIAGNOSIS — L570 Actinic keratosis: Secondary | ICD-10-CM | POA: Diagnosis not present

## 2015-03-25 DIAGNOSIS — D1801 Hemangioma of skin and subcutaneous tissue: Secondary | ICD-10-CM | POA: Diagnosis not present

## 2015-04-09 ENCOUNTER — Ambulatory Visit (HOSPITAL_COMMUNITY)
Admission: RE | Admit: 2015-04-09 | Discharge: 2015-04-09 | Disposition: A | Discharge status: Home / Self Care | Payer: Medicare Other | Source: Ambulatory Visit | Attending: Internal Medicine | Admitting: Internal Medicine

## 2015-04-09 ENCOUNTER — Other Ambulatory Visit (HOSPITAL_BASED_OUTPATIENT_CLINIC_OR_DEPARTMENT_OTHER): Payer: Medicare Other

## 2015-04-09 ENCOUNTER — Encounter (HOSPITAL_COMMUNITY): Payer: Self-pay

## 2015-04-09 DIAGNOSIS — Z85118 Personal history of other malignant neoplasm of bronchus and lung: Secondary | ICD-10-CM | POA: Diagnosis not present

## 2015-04-09 DIAGNOSIS — C3431 Malignant neoplasm of lower lobe, right bronchus or lung: Secondary | ICD-10-CM | POA: Insufficient documentation

## 2015-04-09 DIAGNOSIS — Z9889 Other specified postprocedural states: Secondary | ICD-10-CM | POA: Insufficient documentation

## 2015-04-09 LAB — CBC WITH DIFFERENTIAL/PLATELET
BASO%: 0.4 % (ref 0.0–2.0)
Basophils Absolute: 0 10*3/uL (ref 0.0–0.1)
EOS%: 1.5 % (ref 0.0–7.0)
Eosinophils Absolute: 0.1 10*3/uL (ref 0.0–0.5)
HCT: 43.9 % (ref 34.8–46.6)
HGB: 14.2 g/dL (ref 11.6–15.9)
LYMPH%: 16.9 % (ref 14.0–49.7)
MCH: 29.3 pg (ref 25.1–34.0)
MCHC: 32.5 g/dL (ref 31.5–36.0)
MCV: 90.2 fL (ref 79.5–101.0)
MONO#: 0.7 10*3/uL (ref 0.1–0.9)
MONO%: 9.3 % (ref 0.0–14.0)
NEUT%: 71.9 % (ref 38.4–76.8)
NEUTROS ABS: 5.5 10*3/uL (ref 1.5–6.5)
Platelets: 149 10*3/uL (ref 145–400)
RBC: 4.86 10*6/uL (ref 3.70–5.45)
RDW: 14.6 % — AB (ref 11.2–14.5)
WBC: 7.6 10*3/uL (ref 3.9–10.3)
lymph#: 1.3 10*3/uL (ref 0.9–3.3)

## 2015-04-09 LAB — COMPREHENSIVE METABOLIC PANEL
ALT: 60 U/L — AB (ref 0–55)
AST: 33 U/L (ref 5–34)
Albumin: 3.4 g/dL — ABNORMAL LOW (ref 3.5–5.0)
Alkaline Phosphatase: 139 U/L (ref 40–150)
Anion Gap: 8 mEq/L (ref 3–11)
BUN: 19.2 mg/dL (ref 7.0–26.0)
CALCIUM: 9.5 mg/dL (ref 8.4–10.4)
CHLORIDE: 102 meq/L (ref 98–109)
CO2: 30 mEq/L — ABNORMAL HIGH (ref 22–29)
Creatinine: 1 mg/dL (ref 0.6–1.1)
EGFR: 51 mL/min/{1.73_m2} — ABNORMAL LOW (ref >90)
Glucose: 424 mg/dl — ABNORMAL HIGH (ref 70–140)
POTASSIUM: 4.4 meq/L (ref 3.5–5.1)
SODIUM: 141 meq/L (ref 136–145)
TOTAL PROTEIN: 6.4 g/dL (ref 6.4–8.3)
Total Bilirubin: 0.5 mg/dL (ref 0.20–1.20)

## 2015-04-10 DIAGNOSIS — F0390 Unspecified dementia without behavioral disturbance: Secondary | ICD-10-CM | POA: Diagnosis not present

## 2015-04-10 DIAGNOSIS — E039 Hypothyroidism, unspecified: Secondary | ICD-10-CM | POA: Diagnosis not present

## 2015-04-10 DIAGNOSIS — D696 Thrombocytopenia, unspecified: Secondary | ICD-10-CM | POA: Diagnosis not present

## 2015-04-10 DIAGNOSIS — Z1389 Encounter for screening for other disorder: Secondary | ICD-10-CM | POA: Diagnosis not present

## 2015-04-10 DIAGNOSIS — K219 Gastro-esophageal reflux disease without esophagitis: Secondary | ICD-10-CM | POA: Diagnosis not present

## 2015-04-10 DIAGNOSIS — E7800 Pure hypercholesterolemia, unspecified: Secondary | ICD-10-CM | POA: Diagnosis not present

## 2015-04-10 DIAGNOSIS — Z0001 Encounter for general adult medical examination with abnormal findings: Secondary | ICD-10-CM | POA: Diagnosis not present

## 2015-04-10 DIAGNOSIS — E119 Type 2 diabetes mellitus without complications: Secondary | ICD-10-CM | POA: Diagnosis not present

## 2015-04-16 ENCOUNTER — Ambulatory Visit (HOSPITAL_BASED_OUTPATIENT_CLINIC_OR_DEPARTMENT_OTHER): Payer: Medicare Other | Admitting: Internal Medicine

## 2015-04-16 ENCOUNTER — Encounter: Payer: Self-pay | Admitting: Internal Medicine

## 2015-04-16 ENCOUNTER — Telehealth: Payer: Self-pay | Admitting: Internal Medicine

## 2015-04-16 VITALS — BP 157/47 | HR 70 | Temp 98.3°F | Resp 18 | Ht 65.0 in | Wt 161.0 lb

## 2015-04-16 DIAGNOSIS — Z85118 Personal history of other malignant neoplasm of bronchus and lung: Secondary | ICD-10-CM | POA: Diagnosis not present

## 2015-04-16 DIAGNOSIS — C3411 Malignant neoplasm of upper lobe, right bronchus or lung: Secondary | ICD-10-CM | POA: Insufficient documentation

## 2015-04-16 NOTE — Progress Notes (Signed)
Marcia [name]   [phone]  OFFICE PROGRESS NOTE  Marcia [name], [place] [address]  PRINCIPAL DIAGNOSIS:  1. 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 2. History of stage IA (T1, N0, MX) non-small cell lung cancer diagnosed in January 2007.  PRIOR THERAPY:  1. Status post right upper lobe superior segmentectomy as well as lymph node sampling under the care of Dr. Arlyce Dice on March 09, 2005. 2. Status post right lower lobe superior segmentectomy with lymph node dissection under the care of Dr. Arlyce Dice on June 13, 2007 and the pathology revealed mixed small cell and adenocarcinoma. 3. Status post 4 cycles of adjuvant chemotherapy with carboplatin and etoposide. Last dose was given September 30, 2007.  CURRENT THERAPY: Observation.  INTERVAL HISTORY: Marcia [name] 81 y.o. female returns to the clinic today for annual follow up visit accompanied her husband. The patient is feeling fine today with no specific complaints except for mild cough as she is recovering from flulike symptoms. She did not have any fever or chills at that time. She denied having any significant nausea or vomiting. She has no chest pain, shortness of breath, or hemoptysis. She had repeat CT scan of the chest performed recently and she is here for evaluation and discussion of her scan results.  MEDICAL HISTORY: Past Medical History  Diagnosis Date  . Diabetes mellitus   . Rheumatoid arthritis(714.0)   . Colonic polyp   . Neuropathy ([place])     peripheral  . Goiter     with hypothyrodism  . Osteopenia   . Hyperlipemia   . Lymph node disorder 10R,11R    excision  . Memory loss   . Wears glasses     reading  . Cancer ([place])   . Carcinoma ([place])     lung cell-stage1    ALLERGIES:  is allergic to amoxicillin; cefaclor; hydrocodone; and sulfa antibiotics.  MEDICATIONS:  Current  Outpatient Prescriptions  Medication Sig Dispense Refill  . aspirin 81 MG tablet Take 81 mg by mouth daily.     Marland Kitchen atorvastatin (LIPITOR) 40 MG tablet Take 40 mg by mouth daily.    Marland Kitchen donepezil (ARICEPT) 23 MG TABS tablet Take 1 tablet (23 mg total) by mouth at bedtime. 30 tablet 5  . ezetimibe (ZETIA) 10 MG tablet Take 10 mg by mouth daily.    Marland Kitchen HUMALOG MIX 75/25 KWIKPEN (75-25) 100 UNIT/ML SUSP 24 Units every morning. And take 34 units in pm    . memantine (NAMENDA) 10 MG tablet TAKE 1 TABLET (10 MG TOTAL) BY MOUTH 2 (TWO) TIMES DAILY. 60 tablet 3  . omeprazole (PRILOSEC) 20 MG capsule Take 20 mg by mouth daily.    . predniSONE (DELTASONE) 5 MG tablet Take 5 mg by mouth daily.    . Vitamin D, Ergocalciferol, (DRISDOL) 50000 UNITS CAPS capsule Take 1 capsule by mouth once a week.     No current facility-administered medications for this visit.    SURGICAL HISTORY:  Past Surgical History  Procedure Laterality Date  . Cesarean section    . Lung surgery Right 2006    recurrance 2009,lower lobe superior segment,resection:2007  . Cataract extraction Bilateral   . Wrist surgery Right   . Foot surgery Right     x3  . Lung surgery Right     secondary lower lobe margin, excision,no tumor indentified  . Lesion  removal Right 09/13/2013    Procedure: EXCISION OF RIGHT LEG SQUAMOUS CELL CARCINOMA/RIGHT ARM LESION WITH ACELL;  Surgeon: Theodoro Kos, DO;  Location: [facility];  Service: Plastics;  Laterality: Right;    REVIEW OF SYSTEMS:  A comprehensive review of systems was negative except for: Respiratory: positive for cough   PHYSICAL EXAMINATION: General appearance: alert, cooperative and no distress Head: Normocephalic, without obvious abnormality, atraumatic Neck: no adenopathy Lymph nodes: Cervical, supraclavicular, and axillary nodes normal. Resp: clear to auscultation bilaterally Cardio: regular rate and rhythm, S1, S2 normal, no murmur, click, rub or gallop GI: soft,  non-tender; bowel sounds normal; no masses,  no organomegaly Extremities: extremities normal, atraumatic, no cyanosis or edema  ECOG PERFORMANCE STATUS: 1 - Symptomatic but completely ambulatory  Blood pressure 157/47, pulse 70, temperature 98.3 F (36.8 C), resp. rate 18, height '5\' 5"'$  (1.651 m), weight 161 lb (73.029 kg), SpO2 96 %.  LABORATORY DATA: Lab Results  Component Value Date   WBC 7.6 04/09/2015   HGB 14.2 04/09/2015   HCT 43.9 04/09/2015   MCV 90.2 04/09/2015   PLT 149 04/09/2015      Chemistry      Component Value Date/Time   NA 141 04/09/2015 0934   NA 144 09/07/2013 1455   NA 141 09/04/2011 1011   K 4.4 04/09/2015 0934   K 5.0 09/07/2013 1455   K 4.4 09/04/2011 1011   CL 103 09/07/2013 1455   CL 99 04/04/2012 0921   CL 99 09/04/2011 1011   CO2 30* 04/09/2015 0934   CO2 34* 09/07/2013 1455   CO2 28 09/04/2011 1011   BUN 19.2 04/09/2015 0934   BUN 16 09/07/2013 1455   BUN 17 09/04/2011 1011   CREATININE 1.0 04/09/2015 0934   CREATININE 0.70 09/07/2013 1455   CREATININE 0.7 09/04/2011 1011      Component Value Date/Time   CALCIUM 9.5 04/09/2015 0934   CALCIUM 9.6 09/07/2013 1455   CALCIUM 9.1 09/04/2011 1011   ALKPHOS 139 04/09/2015 0934   ALKPHOS 140* 09/04/2011 1011   ALKPHOS 123* 09/01/2011 1758   AST 33 04/09/2015 0934   AST 21 09/04/2011 1011   AST 11 09/01/2011 1758   ALT 60* 04/09/2015 0934   ALT 23 09/04/2011 1011   ALT 8 09/01/2011 1758   BILITOT 0.50 04/09/2015 0934   BILITOT 0.50 09/04/2011 1011   BILITOT 0.2* 09/01/2011 1758       RADIOGRAPHIC STUDIES: Ct Chest Wo Contrast  04/09/2015  CLINICAL DATA:  Lung cancer diagnosed 2007 and 2009, status post right lung surgery, chemotherapy complete. EXAM: CT CHEST WITHOUT CONTRAST TECHNIQUE: Multidetector CT imaging of the chest was performed following the standard protocol without IV contrast. COMPARISON:  04/03/2014 FINDINGS: Mediastinum/Nodes: The heart is normal in size. No pericardial  effusion. Three vessel coronary atherosclerosis. Postsurgical changes related to prior CABG. Atherosclerotic calcifications of the aortic arch. Small mediastinal lymph nodes measuring up to 6 mm short axis. Visualized thyroid is unremarkable. Lungs/Pleura: Postsurgical changes in the right hemithorax with volume loss, likely related to prior right upper lobe wedge resection. Stable loculated fluid collection along the posterior right upper hemithorax. Mild centrilobular and paraseptal emphysematous changes. No new/ suspicious pulmonary nodules. No focal consolidation. Right posterior Bochdalek hernia. No pneumothorax. Upper abdomen: Visualized upper abdomen is notable for a small hiatal hernia, vascular calcifications, and a 9 mm right renal cyst (series 2/ image 56). Musculoskeletal: Mild degenerative changes of the visualized thoracolumbar spine. IMPRESSION: Stable postsurgical changes in the  right hemithorax. No evidence of recurrent or metastatic disease. Electronically Signed   By: Julian Hy M.D.   On: 04/09/2015 10:59   ASSESSMENT AND PLAN: This is a very pleasant 81 years old white female with recurrent non-small cell lung cancer. She is currently on observation with no evidence for disease recurrence on the recent scan. She has no significant change since her last visit. The recent recent CT scan of the chest showed no evidence for disease recurrence. I recommended for the patient to continue on observation for now with repeat CT scan of the chest in 1 year. She was advised to call immediately if she has any concerning symptoms in the interval.  The patient voices understanding of current disease status and treatment options and is in agreement with the current care plan.  All questions were answered. The patient knows to call the clinic with any problems, questions or concerns. We can certainly see the patient much sooner if necessary.  Disclaimer: This note was dictated with voice  recognition software. Similar sounding words can inadvertently be transcribed and may not be corrected upon review.

## 2015-04-16 NOTE — Telephone Encounter (Signed)
appt made and avs printed °

## 2015-04-23 ENCOUNTER — Ambulatory Visit: Payer: Medicare Other | Admitting: Neurology

## 2015-05-01 ENCOUNTER — Other Ambulatory Visit: Payer: Self-pay | Admitting: Neurology

## 2015-05-02 ENCOUNTER — Ambulatory Visit: Payer: Medicare Other | Admitting: Neurology

## 2015-05-20 DIAGNOSIS — M810 Age-related osteoporosis without current pathological fracture: Secondary | ICD-10-CM | POA: Diagnosis not present

## 2015-05-23 ENCOUNTER — Ambulatory Visit (INDEPENDENT_AMBULATORY_CARE_PROVIDER_SITE_OTHER): Payer: Medicare Other | Admitting: Neurology

## 2015-05-23 ENCOUNTER — Encounter: Payer: Self-pay | Admitting: Neurology

## 2015-05-23 VITALS — BP 120/68 | HR 68 | Resp 20 | Ht 65.0 in | Wt 157.0 lb

## 2015-05-23 DIAGNOSIS — M05712 Rheumatoid arthritis with rheumatoid factor of left shoulder without organ or systems involvement: Secondary | ICD-10-CM | POA: Diagnosis not present

## 2015-05-23 DIAGNOSIS — C3412 Malignant neoplasm of upper lobe, left bronchus or lung: Secondary | ICD-10-CM

## 2015-05-23 DIAGNOSIS — M05711 Rheumatoid arthritis with rheumatoid factor of right shoulder without organ or systems involvement: Secondary | ICD-10-CM

## 2015-05-23 DIAGNOSIS — F0150 Vascular dementia without behavioral disturbance: Secondary | ICD-10-CM

## 2015-05-23 NOTE — Progress Notes (Signed)
[name]: Marcia [name] DOB: Dec 07, 1934  REASON FOR VISIT: follow up-  memory HISTORY FROM: [name]  HISTORY OF PRESENT ILLNESS:  05-23-2015  Marcia [name] is a 81 year old female with a history of dementia.She continues to take Aricept  23 mg and Namenda and tolerates these medications well. She is able to complete all ADLs independently.  Although husband reports that she does require some prompting. She does not operate a motor vehicle.   She no longer cooks. Family did have a physical therapist coming in to work with Marcia [name] however Marcia husband did not find this beneficial for her. She continues to have some difficulty with stability and balance. Husband states he notices it Marcia most when she is trying to get in and out of a car.  Marcia husband reports that she does have some aggressiveness. However he has learned not to respond to this and Marcia [name] will calm down. He does feel that it would be beneficial to have an aide in Marcia home. He states that she tends to forget to put on things like her underwear.   Rachel Bo also reports that she tends to have urinary incontinence in Marcia mornings. Helped by Myrbetriq . He states that he tries to prompt her to go to Marcia bathroom as soon as she gets up but Marcia [name] does not  Like to be told what to do therefore she is very resistant. She sleeps a lot, she talks to Marcia dog, " DJ ",  a lot.  However Marcia [name] is very resistant to having someone in Marcia home to help her.  HISTORY  04/19/14: Marcia [name] is a 81 year old female with a history of dementia. She returns today for follow-up. She is currently taking Aricept and Namenda and tolerating them well. She continues to be on a complete all ADLs independently. However her husband states that she will get very agitated with him if he recommends something that she should wear. He states that she also takes very long time getting ready which tends to make them late for things. Marcia [name] no longer operates a motor  vehicle. Marcia [name] is no longer cooking meals. Reports that Marcia [name] has a hard time operating Marcia TV remote. Therefore she usually watches 1 channel all day long. He states for this reason she does not keep up with current events or Marcia date. Husband feels that her memory has remained Marcia same however her attitude has changed. He denies any physical or verbally aggressive behavior. However he states that she is very stubborn and does not want assistance with certain things. Marcia husband does have a personal trainer coming in twice a week for 2 hours to help Marcia [name] with strength in Marcia lower extremities. He does state that Marcia [name] had a fall yesterday while he was at work. He is unsure where or how Marcia [name] fell. She sustained any significant injuries other than lacerations on Marcia knee and bruising. No new neurological complaints. No new medical issues since last seen.  REVIEW OF SYSTEMS: Out of a complete 14 system review of symptoms, Marcia [name] complains only of Marcia following symptoms, and all other reviewed systems are negative.   incontinence of bladder  MMSE - Mini Mental State Exam 05/23/2015 10/24/2014 10/24/2014  Orientation to time 0 0 0  Orientation to Place 1 0 0  Registration 0 3 3  Attention/ Calculation 0 2 2  Recall 0 0 0  Language- name 2 objects 1 2  2  Language- repeat '1 1 1  '$ Language- follow 3 step command '3 2 3  '$ Language- read & follow direction '1 1 1  '$ Write a sentence 1 0 0  Copy design 0 1 1  Total score '8 12 13     '$ ALLERGIES: Allergies  Allergen Reactions  . Amoxicillin Nausea And Vomiting  . Cefaclor Nausea And Vomiting  . Hydrocodone Nausea And Vomiting  . Sulfa Antibiotics     " legs give out"    HOME MEDICATIONS: Outpatient Prescriptions Prior to Visit  Medication Sig Dispense Refill  . aspirin 81 MG tablet Take 81 mg by mouth daily.     Marland Kitchen atorvastatin (LIPITOR) 40 MG tablet Take 40 mg by mouth daily.    Marland Kitchen donepezil (ARICEPT) 23 MG TABS tablet  TAKE 1 TABLET (23 MG TOTAL) BY MOUTH AT BEDTIME. 30 tablet 5  . ezetimibe (ZETIA) 10 MG tablet Take 10 mg by mouth daily.    Marland Kitchen HUMALOG MIX 75/25 KWIKPEN (75-25) 100 UNIT/ML SUSP 24 Units every morning. And take 34 units in pm    . memantine (NAMENDA) 10 MG tablet TAKE 1 TABLET (10 MG TOTAL) BY MOUTH 2 (TWO) TIMES DAILY. 60 tablet 3  . predniSONE (DELTASONE) 5 MG tablet Take 5 mg by mouth daily.    . Vitamin D, Ergocalciferol, (DRISDOL) 50000 UNITS CAPS capsule Take 1 capsule by mouth once a week.    Marland Kitchen omeprazole (PRILOSEC) 20 MG capsule Take 20 mg by mouth daily.     No facility-administered medications prior to visit.    PAST MEDICAL HISTORY: Past Medical History  Diagnosis Date  . Diabetes mellitus   . Rheumatoid arthritis(714.0)   . Colonic polyp   . Neuropathy ([place])     peripheral  . Goiter     with hypothyrodism  . Osteopenia   . Hyperlipemia   . Lymph node disorder 10R,11R    excision  . Memory loss   . Wears glasses     reading  . Cancer ([place])   . Carcinoma ([place])     lung cell-stage1    PAST SURGICAL HISTORY: Past Surgical History  Procedure Laterality Date  . Cesarean section    . Lung surgery Right 2006    recurrance 2009,lower lobe superior segment,resection:2007  . Cataract extraction Bilateral   . Wrist surgery Right   . Foot surgery Right     x3  . Lung surgery Right     secondary lower lobe margin, excision,no tumor indentified  . Lesion removal Right 09/13/2013    Procedure: EXCISION OF RIGHT LEG SQUAMOUS CELL CARCINOMA/RIGHT ARM LESION WITH ACELL;  Surgeon: Theodoro Kos, DO;  Location: [facility];  Service: Plastics;  Laterality: Right;    FAMILY HISTORY: No family history on file.  SOCIAL HISTORY: Social History   Social History  . Marital Status: Married    Spouse Name: Wynetta Emery  . Number of Children: 3  . Years of Education: College   Occupational History  . Not on file.   Social History Main Topics  . Smoking status:  Former Smoker    Quit date: 01/16/2005  . Smokeless tobacco: Never Used  . Alcohol Use: No  . Drug Use: No  . Sexual Activity: Yes    Birth Control/ Protection: None   Other Topics Concern  . Not on file   Social History Narrative   Pt lives at home with her spouse Wynetta Emery)   [name] has three children.   [name]  is retired.   [name] has a college education.   [name] is right-handed.   Caffeine Use-2 cups daily      PHYSICAL EXAM  Filed Vitals:   05/23/15 1433  BP: 120/68  Pulse: 68  Resp: 20  Height: '5\' 5"'$  (1.651 m)  Weight: 157 lb (71.215 kg)   Body mass index is 26.13 kg/(m^2).   MMSE - Mini Mental State Exam 05/23/2015 10/24/2014 10/24/2014 04/19/2014  Orientation to time 0 0 0 0  Orientation to Place 1 0 0 1  Registration 0 '3 3 3  '$ Attention/ Calculation 0 '2 2 3  '$ Recall 0 0 0 0  Language- name 2 objects '1 2 2 2  '$ Language- repeat '1 1 1 '$ 0  Language- follow 3 step command '3 2 3 3  '$ Language- read & follow direction '1 1 1 1  '$ Write a sentence 1 0 0 1  Copy design 0 1 1 0  Total score '8 12 13 14        '$ Generalized: Well developed, in no acute distress   Neurological examination  Mentation: Alert. Follows all commands speech and language fluent. Cranial nerve II-XII:  Extraocular movements were full, visual field were full on confrontational test. Facial sensation and strength were normal. Uvula tongue midline. Head turning and shoulder shrug  were normal and symmetric. Motor: Marcia motor testing reveals 5 over 5 strength of all 4 extremities. Good symmetric motor tone is noted throughout.  Sensory: Sensory testing is intact to soft touch on all 4 extremities. No evidence of extinction is noted.  Coordination: Cerebellar testing reveals good finger-nose-finger and heel-to-shin bilaterally.  Gait and station:  [name]  Requires assistance when standing from a sitting position. [name]'s gait is slightly unsteady. Tandem gait unsteady. Romberg is negative.  Reflexes:  Deep tendon reflexes are symmetric and normal bilaterally.   DIAGNOSTIC DATA (LABS, IMAGING, TESTING) - I reviewed [name] records, labs, notes, testing and imaging myself where available.  Lab Results  Component Value Date   WBC 7.6 04/09/2015   HGB 14.2 04/09/2015   HCT 43.9 04/09/2015   MCV 90.2 04/09/2015   PLT 149 04/09/2015      Component Value Date/Time   NA 141 04/09/2015 0934   NA 144 09/07/2013 1455   NA 141 09/04/2011 1011   K 4.4 04/09/2015 0934   K 5.0 09/07/2013 1455   K 4.4 09/04/2011 1011   CL 103 09/07/2013 1455   CL 99 04/04/2012 0921   CL 99 09/04/2011 1011   CO2 30* 04/09/2015 0934   CO2 34* 09/07/2013 1455   CO2 28 09/04/2011 1011   GLUCOSE 424* 04/09/2015 0934   GLUCOSE 82 09/07/2013 1455   GLUCOSE 275* 04/04/2012 0921   GLUCOSE 297* 09/04/2011 1011   BUN 19.2 04/09/2015 0934   BUN 16 09/07/2013 1455   BUN 17 09/04/2011 1011   CREATININE 1.0 04/09/2015 0934   CREATININE 0.70 09/07/2013 1455   CREATININE 0.7 09/04/2011 1011   CALCIUM 9.5 04/09/2015 0934   CALCIUM 9.6 09/07/2013 1455   CALCIUM 9.1 09/04/2011 1011   PROT 6.4 04/09/2015 0934   PROT 6.4 09/04/2011 1011   PROT 6.5 09/01/2011 1758   ALBUMIN 3.4* 04/09/2015 0934   ALBUMIN 3.1* 09/04/2011 1011   ALBUMIN 3.1* 09/01/2011 1758   AST 33 04/09/2015 0934   AST 21 09/04/2011 1011   AST 11 09/01/2011 1758   ALT 60* 04/09/2015 0934   ALT 23 09/04/2011 1011   ALT 8 09/01/2011 1758  ALKPHOS 139 04/09/2015 0934   ALKPHOS 140* 09/04/2011 1011   ALKPHOS 123* 09/01/2011 1758   BILITOT 0.50 04/09/2015 0934   BILITOT 0.50 09/04/2011 1011   BILITOT 0.2* 09/01/2011 1758   GFRNONAA 82* 09/07/2013 1455   GFRAA >90 09/07/2013 1455   No results found for: CHOL, HDL, LDLCALC, LDLDIRECT, TRIG, CHOLHDL No results found for: HGBA1C No results found for: VITAMINB12 No results found for: TSH    ASSESSMENT AND PLAN 81 y.o. year old female  has a past medical history of Diabetes mellitus;  Rheumatoid arthritis(714.0); Colonic polyp; Neuropathy ([place]); Goiter; Osteopenia; Hyperlipemia; Lymph node disorder (10R,11R); Memory loss; Wears glasses; Cancer ([place]); and Carcinoma ([place]). here with:  1. Dementia in a [name] with advanced microvascular disease , diabetes, declining scores on MMSE.   Overall Marcia [name]'s memory has  declined to 8- out of 30 on MMSE. Her MMSE last visit was 13/30 , and was previously 14/30.  She will continue on Namenda. She will increase Aricept 23 mg daily.  Advised Marcia husband that common side effect is GI upset but this has not affected her.  I do feel that Marcia [name] would benefit from having an aid in Marcia home. I have provided Marcia husband with Marcia number to care patrol specifically Laurey Morale to help him with this process.  Currently she is left alone at home between 8 - 11 AM and 1-4 PM. She doesn't make attempts to leave Marcia house.  Dr Tamala Julian prescribed Myrbetriq , for urinary incontinence. She sleeps through Marcia night. She is very hypersomnic. She wears diapers now.  [name] and her husband advised that if her symptoms worsen or she develops new symptoms she should let us know.  She will follow-up in 6 months with Ward Givens, for another 25 minute RV. I prefer AM time visit for this [name] .   Larey Seat, MD  05/23/2015, 3:11 PM [facility] [address], [place], [place] [address] [phone]

## 2015-06-17 DIAGNOSIS — D696 Thrombocytopenia, unspecified: Secondary | ICD-10-CM | POA: Diagnosis not present

## 2015-06-29 DIAGNOSIS — S72142A Displaced intertrochanteric fracture of left femur, initial encounter for closed fracture: Secondary | ICD-10-CM | POA: Diagnosis not present

## 2015-06-29 DIAGNOSIS — G309 Alzheimer's disease, unspecified: Secondary | ICD-10-CM | POA: Diagnosis not present

## 2015-06-29 DIAGNOSIS — R918 Other nonspecific abnormal finding of lung field: Secondary | ICD-10-CM | POA: Diagnosis not present

## 2015-06-29 DIAGNOSIS — M1712 Unilateral primary osteoarthritis, left knee: Secondary | ICD-10-CM | POA: Diagnosis not present

## 2015-06-29 DIAGNOSIS — M160 Bilateral primary osteoarthritis of hip: Secondary | ICD-10-CM | POA: Diagnosis not present

## 2015-06-29 DIAGNOSIS — M1612 Unilateral primary osteoarthritis, left hip: Secondary | ICD-10-CM | POA: Diagnosis not present

## 2015-06-29 DIAGNOSIS — J9601 Acute respiratory failure with hypoxia: Secondary | ICD-10-CM | POA: Diagnosis not present

## 2015-06-29 DIAGNOSIS — E8770 Fluid overload, unspecified: Secondary | ICD-10-CM | POA: Diagnosis not present

## 2015-06-29 DIAGNOSIS — M80052A Age-related osteoporosis with current pathological fracture, left femur, initial encounter for fracture: Secondary | ICD-10-CM | POA: Diagnosis not present

## 2015-06-29 DIAGNOSIS — M25552 Pain in left hip: Secondary | ICD-10-CM | POA: Diagnosis not present

## 2015-06-29 DIAGNOSIS — W109XXA Fall (on) (from) unspecified stairs and steps, initial encounter: Secondary | ICD-10-CM | POA: Diagnosis not present

## 2015-06-29 DIAGNOSIS — S72092A Other fracture of head and neck of left femur, initial encounter for closed fracture: Secondary | ICD-10-CM | POA: Diagnosis not present

## 2015-06-29 DIAGNOSIS — W19XXXA Unspecified fall, initial encounter: Secondary | ICD-10-CM | POA: Diagnosis not present

## 2015-06-29 DIAGNOSIS — E1165 Type 2 diabetes mellitus with hyperglycemia: Secondary | ICD-10-CM | POA: Diagnosis not present

## 2015-06-29 DIAGNOSIS — F0390 Unspecified dementia without behavioral disturbance: Secondary | ICD-10-CM | POA: Diagnosis not present

## 2015-06-29 DIAGNOSIS — Z043 Encounter for examination and observation following other accident: Secondary | ICD-10-CM | POA: Diagnosis not present

## 2015-06-29 DIAGNOSIS — R739 Hyperglycemia, unspecified: Secondary | ICD-10-CM | POA: Diagnosis not present

## 2015-06-29 DIAGNOSIS — T148 Other injury of unspecified body region: Secondary | ICD-10-CM | POA: Diagnosis not present

## 2015-06-29 DIAGNOSIS — F0280 Dementia in other diseases classified elsewhere without behavioral disturbance: Secondary | ICD-10-CM | POA: Diagnosis not present

## 2015-06-30 DIAGNOSIS — T148 Other injury of unspecified body region: Secondary | ICD-10-CM | POA: Diagnosis not present

## 2015-06-30 DIAGNOSIS — E119 Type 2 diabetes mellitus without complications: Secondary | ICD-10-CM | POA: Diagnosis not present

## 2015-06-30 DIAGNOSIS — W19XXXA Unspecified fall, initial encounter: Secondary | ICD-10-CM | POA: Diagnosis not present

## 2015-06-30 DIAGNOSIS — R4182 Altered mental status, unspecified: Secondary | ICD-10-CM | POA: Diagnosis not present

## 2015-06-30 DIAGNOSIS — Z85118 Personal history of other malignant neoplasm of bronchus and lung: Secondary | ICD-10-CM | POA: Diagnosis not present

## 2015-06-30 DIAGNOSIS — Z79899 Other long term (current) drug therapy: Secondary | ICD-10-CM | POA: Diagnosis not present

## 2015-06-30 DIAGNOSIS — R278 Other lack of coordination: Secondary | ICD-10-CM | POA: Diagnosis not present

## 2015-06-30 DIAGNOSIS — Z87891 Personal history of nicotine dependence: Secondary | ICD-10-CM | POA: Diagnosis not present

## 2015-06-30 DIAGNOSIS — J9601 Acute respiratory failure with hypoxia: Secondary | ICD-10-CM | POA: Diagnosis not present

## 2015-06-30 DIAGNOSIS — M069 Rheumatoid arthritis, unspecified: Secondary | ICD-10-CM | POA: Diagnosis not present

## 2015-06-30 DIAGNOSIS — Z794 Long term (current) use of insulin: Secondary | ICD-10-CM | POA: Diagnosis not present

## 2015-06-30 DIAGNOSIS — R2689 Other abnormalities of gait and mobility: Secondary | ICD-10-CM | POA: Diagnosis not present

## 2015-06-30 DIAGNOSIS — Z7982 Long term (current) use of aspirin: Secondary | ICD-10-CM | POA: Diagnosis not present

## 2015-06-30 DIAGNOSIS — Z4789 Encounter for other orthopedic aftercare: Secondary | ICD-10-CM | POA: Diagnosis not present

## 2015-06-30 DIAGNOSIS — M810 Age-related osteoporosis without current pathological fracture: Secondary | ICD-10-CM | POA: Diagnosis not present

## 2015-06-30 DIAGNOSIS — G309 Alzheimer's disease, unspecified: Secondary | ICD-10-CM | POA: Diagnosis present

## 2015-06-30 DIAGNOSIS — G301 Alzheimer's disease with late onset: Secondary | ICD-10-CM | POA: Diagnosis not present

## 2015-06-30 DIAGNOSIS — R918 Other nonspecific abnormal finding of lung field: Secondary | ICD-10-CM | POA: Diagnosis not present

## 2015-06-30 DIAGNOSIS — S72142A Displaced intertrochanteric fracture of left femur, initial encounter for closed fracture: Secondary | ICD-10-CM | POA: Diagnosis not present

## 2015-06-30 DIAGNOSIS — E8770 Fluid overload, unspecified: Secondary | ICD-10-CM | POA: Diagnosis not present

## 2015-06-30 DIAGNOSIS — E785 Hyperlipidemia, unspecified: Secondary | ICD-10-CM | POA: Diagnosis present

## 2015-06-30 DIAGNOSIS — Z9981 Dependence on supplemental oxygen: Secondary | ICD-10-CM | POA: Diagnosis not present

## 2015-06-30 DIAGNOSIS — M80052D Age-related osteoporosis with current pathological fracture, left femur, subsequent encounter for fracture with routine healing: Secondary | ICD-10-CM | POA: Diagnosis not present

## 2015-06-30 DIAGNOSIS — Z043 Encounter for examination and observation following other accident: Secondary | ICD-10-CM | POA: Diagnosis not present

## 2015-06-30 DIAGNOSIS — Z7952 Long term (current) use of systemic steroids: Secondary | ICD-10-CM | POA: Diagnosis not present

## 2015-06-30 DIAGNOSIS — I10 Essential (primary) hypertension: Secondary | ICD-10-CM | POA: Diagnosis present

## 2015-06-30 DIAGNOSIS — Z5181 Encounter for therapeutic drug level monitoring: Secondary | ICD-10-CM | POA: Diagnosis not present

## 2015-06-30 DIAGNOSIS — S7292XA Unspecified fracture of left femur, initial encounter for closed fracture: Secondary | ICD-10-CM | POA: Diagnosis not present

## 2015-06-30 DIAGNOSIS — Z7409 Other reduced mobility: Secondary | ICD-10-CM | POA: Diagnosis not present

## 2015-06-30 DIAGNOSIS — M6281 Muscle weakness (generalized): Secondary | ICD-10-CM | POA: Diagnosis not present

## 2015-06-30 DIAGNOSIS — J9600 Acute respiratory failure, unspecified whether with hypoxia or hypercapnia: Secondary | ICD-10-CM | POA: Diagnosis not present

## 2015-06-30 DIAGNOSIS — M7989 Other specified soft tissue disorders: Secondary | ICD-10-CM | POA: Diagnosis not present

## 2015-06-30 DIAGNOSIS — R410 Disorientation, unspecified: Secondary | ICD-10-CM | POA: Diagnosis not present

## 2015-06-30 DIAGNOSIS — G8918 Other acute postprocedural pain: Secondary | ICD-10-CM | POA: Diagnosis not present

## 2015-06-30 DIAGNOSIS — E1165 Type 2 diabetes mellitus with hyperglycemia: Secondary | ICD-10-CM | POA: Diagnosis present

## 2015-06-30 DIAGNOSIS — M80052S Age-related osteoporosis with current pathological fracture, left femur, sequela: Secondary | ICD-10-CM | POA: Diagnosis not present

## 2015-06-30 DIAGNOSIS — F0280 Dementia in other diseases classified elsewhere without behavioral disturbance: Secondary | ICD-10-CM | POA: Diagnosis present

## 2015-07-05 DIAGNOSIS — Z5181 Encounter for therapeutic drug level monitoring: Secondary | ICD-10-CM | POA: Diagnosis not present

## 2015-07-05 DIAGNOSIS — M810 Age-related osteoporosis without current pathological fracture: Secondary | ICD-10-CM | POA: Diagnosis not present

## 2015-07-05 DIAGNOSIS — E785 Hyperlipidemia, unspecified: Secondary | ICD-10-CM | POA: Diagnosis present

## 2015-07-05 DIAGNOSIS — M79609 Pain in unspecified limb: Secondary | ICD-10-CM | POA: Diagnosis not present

## 2015-07-05 DIAGNOSIS — J9601 Acute respiratory failure with hypoxia: Secondary | ICD-10-CM | POA: Diagnosis not present

## 2015-07-05 DIAGNOSIS — M6281 Muscle weakness (generalized): Secondary | ICD-10-CM | POA: Diagnosis not present

## 2015-07-05 DIAGNOSIS — M069 Rheumatoid arthritis, unspecified: Secondary | ICD-10-CM | POA: Diagnosis present

## 2015-07-05 DIAGNOSIS — E119 Type 2 diabetes mellitus without complications: Secondary | ICD-10-CM | POA: Diagnosis not present

## 2015-07-05 DIAGNOSIS — Z880 Allergy status to penicillin: Secondary | ICD-10-CM | POA: Diagnosis not present

## 2015-07-05 DIAGNOSIS — R2689 Other abnormalities of gait and mobility: Secondary | ICD-10-CM | POA: Diagnosis not present

## 2015-07-05 DIAGNOSIS — G912 (Idiopathic) normal pressure hydrocephalus: Secondary | ICD-10-CM | POA: Diagnosis present

## 2015-07-05 DIAGNOSIS — Z4789 Encounter for other orthopedic aftercare: Secondary | ICD-10-CM | POA: Diagnosis not present

## 2015-07-05 DIAGNOSIS — F0390 Unspecified dementia without behavioral disturbance: Secondary | ICD-10-CM | POA: Diagnosis not present

## 2015-07-05 DIAGNOSIS — G919 Hydrocephalus, unspecified: Secondary | ICD-10-CM | POA: Diagnosis not present

## 2015-07-05 DIAGNOSIS — E8770 Fluid overload, unspecified: Secondary | ICD-10-CM | POA: Diagnosis not present

## 2015-07-05 DIAGNOSIS — R918 Other nonspecific abnormal finding of lung field: Secondary | ICD-10-CM | POA: Diagnosis not present

## 2015-07-05 DIAGNOSIS — W19XXXD Unspecified fall, subsequent encounter: Secondary | ICD-10-CM | POA: Diagnosis present

## 2015-07-05 DIAGNOSIS — G9340 Encephalopathy, unspecified: Secondary | ICD-10-CM | POA: Diagnosis not present

## 2015-07-05 DIAGNOSIS — R739 Hyperglycemia, unspecified: Secondary | ICD-10-CM | POA: Diagnosis not present

## 2015-07-05 DIAGNOSIS — R410 Disorientation, unspecified: Secondary | ICD-10-CM | POA: Diagnosis not present

## 2015-07-05 DIAGNOSIS — E113399 Type 2 diabetes mellitus with moderate nonproliferative diabetic retinopathy without macular edema, unspecified eye: Secondary | ICD-10-CM | POA: Diagnosis not present

## 2015-07-05 DIAGNOSIS — M06812 Other specified rheumatoid arthritis, left shoulder: Secondary | ICD-10-CM | POA: Diagnosis present

## 2015-07-05 DIAGNOSIS — Z85118 Personal history of other malignant neoplasm of bronchus and lung: Secondary | ICD-10-CM | POA: Diagnosis not present

## 2015-07-05 DIAGNOSIS — G301 Alzheimer's disease with late onset: Secondary | ICD-10-CM | POA: Diagnosis not present

## 2015-07-05 DIAGNOSIS — M06811 Other specified rheumatoid arthritis, right shoulder: Secondary | ICD-10-CM | POA: Diagnosis present

## 2015-07-05 DIAGNOSIS — E860 Dehydration: Secondary | ICD-10-CM | POA: Diagnosis not present

## 2015-07-05 DIAGNOSIS — E784 Other hyperlipidemia: Secondary | ICD-10-CM | POA: Diagnosis not present

## 2015-07-05 DIAGNOSIS — Z882 Allergy status to sulfonamides status: Secondary | ICD-10-CM | POA: Diagnosis not present

## 2015-07-05 DIAGNOSIS — B961 Klebsiella pneumoniae [K. pneumoniae] as the cause of diseases classified elsewhere: Secondary | ICD-10-CM | POA: Diagnosis present

## 2015-07-05 DIAGNOSIS — R339 Retention of urine, unspecified: Secondary | ICD-10-CM | POA: Diagnosis present

## 2015-07-05 DIAGNOSIS — J449 Chronic obstructive pulmonary disease, unspecified: Secondary | ICD-10-CM | POA: Diagnosis not present

## 2015-07-05 DIAGNOSIS — Z7409 Other reduced mobility: Secondary | ICD-10-CM | POA: Diagnosis not present

## 2015-07-05 DIAGNOSIS — M7989 Other specified soft tissue disorders: Secondary | ICD-10-CM | POA: Diagnosis not present

## 2015-07-05 DIAGNOSIS — Z87891 Personal history of nicotine dependence: Secondary | ICD-10-CM | POA: Diagnosis not present

## 2015-07-05 DIAGNOSIS — R402411 Glasgow coma scale score 13-15, in the field [EMT or ambulance]: Secondary | ICD-10-CM | POA: Diagnosis not present

## 2015-07-05 DIAGNOSIS — R4182 Altered mental status, unspecified: Secondary | ICD-10-CM | POA: Diagnosis not present

## 2015-07-05 DIAGNOSIS — S72142A Displaced intertrochanteric fracture of left femur, initial encounter for closed fracture: Secondary | ICD-10-CM | POA: Diagnosis not present

## 2015-07-05 DIAGNOSIS — E876 Hypokalemia: Secondary | ICD-10-CM | POA: Diagnosis not present

## 2015-07-05 DIAGNOSIS — N390 Urinary tract infection, site not specified: Secondary | ICD-10-CM | POA: Diagnosis not present

## 2015-07-05 DIAGNOSIS — S72142D Displaced intertrochanteric fracture of left femur, subsequent encounter for closed fracture with routine healing: Secondary | ICD-10-CM | POA: Diagnosis not present

## 2015-07-05 DIAGNOSIS — E1165 Type 2 diabetes mellitus with hyperglycemia: Secondary | ICD-10-CM | POA: Diagnosis present

## 2015-07-05 DIAGNOSIS — Z794 Long term (current) use of insulin: Secondary | ICD-10-CM | POA: Diagnosis not present

## 2015-07-05 DIAGNOSIS — R7309 Other abnormal glucose: Secondary | ICD-10-CM | POA: Diagnosis not present

## 2015-07-05 DIAGNOSIS — E109 Type 1 diabetes mellitus without complications: Secondary | ICD-10-CM | POA: Diagnosis not present

## 2015-07-05 DIAGNOSIS — G309 Alzheimer's disease, unspecified: Secondary | ICD-10-CM | POA: Diagnosis not present

## 2015-07-05 DIAGNOSIS — Z7952 Long term (current) use of systemic steroids: Secondary | ICD-10-CM | POA: Diagnosis not present

## 2015-07-05 DIAGNOSIS — K5900 Constipation, unspecified: Secondary | ICD-10-CM | POA: Diagnosis not present

## 2015-07-05 DIAGNOSIS — S72002S Fracture of unspecified part of neck of left femur, sequela: Secondary | ICD-10-CM | POA: Diagnosis not present

## 2015-07-05 DIAGNOSIS — D62 Acute posthemorrhagic anemia: Secondary | ICD-10-CM | POA: Diagnosis present

## 2015-07-05 DIAGNOSIS — I10 Essential (primary) hypertension: Secondary | ICD-10-CM | POA: Diagnosis present

## 2015-07-05 DIAGNOSIS — N3000 Acute cystitis without hematuria: Secondary | ICD-10-CM | POA: Diagnosis not present

## 2015-07-05 DIAGNOSIS — T148 Other injury of unspecified body region: Secondary | ICD-10-CM | POA: Diagnosis not present

## 2015-07-05 DIAGNOSIS — Z8673 Personal history of transient ischemic attack (TIA), and cerebral infarction without residual deficits: Secondary | ICD-10-CM | POA: Diagnosis not present

## 2015-07-05 DIAGNOSIS — Z885 Allergy status to narcotic agent status: Secondary | ICD-10-CM | POA: Diagnosis not present

## 2015-07-05 DIAGNOSIS — M80052S Age-related osteoporosis with current pathological fracture, left femur, sequela: Secondary | ICD-10-CM | POA: Diagnosis not present

## 2015-07-05 DIAGNOSIS — J9600 Acute respiratory failure, unspecified whether with hypoxia or hypercapnia: Secondary | ICD-10-CM | POA: Diagnosis not present

## 2015-07-05 DIAGNOSIS — Z7982 Long term (current) use of aspirin: Secondary | ICD-10-CM | POA: Diagnosis not present

## 2015-07-05 DIAGNOSIS — R278 Other lack of coordination: Secondary | ICD-10-CM | POA: Diagnosis not present

## 2015-07-05 DIAGNOSIS — D508 Other iron deficiency anemias: Secondary | ICD-10-CM | POA: Diagnosis not present

## 2015-07-08 DIAGNOSIS — G309 Alzheimer's disease, unspecified: Secondary | ICD-10-CM | POA: Diagnosis not present

## 2015-07-08 DIAGNOSIS — E1165 Type 2 diabetes mellitus with hyperglycemia: Secondary | ICD-10-CM | POA: Diagnosis not present

## 2015-07-08 DIAGNOSIS — F0390 Unspecified dementia without behavioral disturbance: Secondary | ICD-10-CM | POA: Diagnosis not present

## 2015-07-08 DIAGNOSIS — M069 Rheumatoid arthritis, unspecified: Secondary | ICD-10-CM | POA: Diagnosis not present

## 2015-07-08 DIAGNOSIS — S72002S Fracture of unspecified part of neck of left femur, sequela: Secondary | ICD-10-CM | POA: Diagnosis not present

## 2015-07-08 DIAGNOSIS — D508 Other iron deficiency anemias: Secondary | ICD-10-CM | POA: Diagnosis not present

## 2015-07-08 DIAGNOSIS — E784 Other hyperlipidemia: Secondary | ICD-10-CM | POA: Diagnosis not present

## 2015-07-08 DIAGNOSIS — J449 Chronic obstructive pulmonary disease, unspecified: Secondary | ICD-10-CM | POA: Diagnosis not present

## 2015-07-08 DIAGNOSIS — E113399 Type 2 diabetes mellitus with moderate nonproliferative diabetic retinopathy without macular edema, unspecified eye: Secondary | ICD-10-CM | POA: Diagnosis not present

## 2015-07-08 DIAGNOSIS — E109 Type 1 diabetes mellitus without complications: Secondary | ICD-10-CM | POA: Diagnosis not present

## 2015-07-11 ENCOUNTER — Emergency Department (HOSPITAL_COMMUNITY): Payer: Medicare Other

## 2015-07-11 ENCOUNTER — Inpatient Hospital Stay (HOSPITAL_COMMUNITY): Payer: Medicare Other

## 2015-07-11 ENCOUNTER — Emergency Department (HOSPITAL_COMMUNITY)
Admit: 2015-07-11 | Discharge: 2015-07-11 | Disposition: A | Discharge status: Home / Self Care | Payer: Medicare Other | Attending: Emergency Medicine | Admitting: Emergency Medicine

## 2015-07-11 ENCOUNTER — Encounter (HOSPITAL_COMMUNITY): Payer: Self-pay | Admitting: *Deleted

## 2015-07-11 ENCOUNTER — Inpatient Hospital Stay (HOSPITAL_COMMUNITY)
Admission: EM | Admit: 2015-07-11 | Discharge: 2015-07-15 | DRG: 689 | Disposition: A | Discharge status: Skilled Nursing Facility | Payer: Medicare Other | Source: Skilled Nursing Facility | Attending: Internal Medicine | Admitting: Internal Medicine

## 2015-07-11 DIAGNOSIS — B961 Klebsiella pneumoniae [K. pneumoniae] as the cause of diseases classified elsewhere: Secondary | ICD-10-CM | POA: Diagnosis present

## 2015-07-11 DIAGNOSIS — G9340 Encephalopathy, unspecified: Secondary | ICD-10-CM

## 2015-07-11 DIAGNOSIS — Z5181 Encounter for therapeutic drug level monitoring: Secondary | ICD-10-CM | POA: Diagnosis not present

## 2015-07-11 DIAGNOSIS — Z880 Allergy status to penicillin: Secondary | ICD-10-CM | POA: Diagnosis not present

## 2015-07-11 DIAGNOSIS — F0390 Unspecified dementia without behavioral disturbance: Secondary | ICD-10-CM | POA: Diagnosis not present

## 2015-07-11 DIAGNOSIS — M79609 Pain in unspecified limb: Secondary | ICD-10-CM | POA: Diagnosis not present

## 2015-07-11 DIAGNOSIS — R079 Chest pain, unspecified: Secondary | ICD-10-CM | POA: Diagnosis not present

## 2015-07-11 DIAGNOSIS — Z87891 Personal history of nicotine dependence: Secondary | ICD-10-CM

## 2015-07-11 DIAGNOSIS — E1165 Type 2 diabetes mellitus with hyperglycemia: Secondary | ICD-10-CM | POA: Diagnosis present

## 2015-07-11 DIAGNOSIS — Z8673 Personal history of transient ischemic attack (TIA), and cerebral infarction without residual deficits: Secondary | ICD-10-CM | POA: Diagnosis not present

## 2015-07-11 DIAGNOSIS — R0602 Shortness of breath: Secondary | ICD-10-CM | POA: Diagnosis not present

## 2015-07-11 DIAGNOSIS — E785 Hyperlipidemia, unspecified: Secondary | ICD-10-CM | POA: Diagnosis present

## 2015-07-11 DIAGNOSIS — I10 Essential (primary) hypertension: Secondary | ICD-10-CM | POA: Diagnosis present

## 2015-07-11 DIAGNOSIS — Z885 Allergy status to narcotic agent status: Secondary | ICD-10-CM | POA: Diagnosis not present

## 2015-07-11 DIAGNOSIS — R0902 Hypoxemia: Secondary | ICD-10-CM

## 2015-07-11 DIAGNOSIS — R918 Other nonspecific abnormal finding of lung field: Secondary | ICD-10-CM | POA: Diagnosis not present

## 2015-07-11 DIAGNOSIS — R338 Other retention of urine: Secondary | ICD-10-CM

## 2015-07-11 DIAGNOSIS — M06812 Other specified rheumatoid arthritis, left shoulder: Secondary | ICD-10-CM | POA: Diagnosis present

## 2015-07-11 DIAGNOSIS — M069 Rheumatoid arthritis, unspecified: Secondary | ICD-10-CM | POA: Diagnosis present

## 2015-07-11 DIAGNOSIS — W19XXXD Unspecified fall, subsequent encounter: Secondary | ICD-10-CM | POA: Diagnosis present

## 2015-07-11 DIAGNOSIS — E119 Type 2 diabetes mellitus without complications: Secondary | ICD-10-CM | POA: Diagnosis not present

## 2015-07-11 DIAGNOSIS — Z882 Allergy status to sulfonamides status: Secondary | ICD-10-CM

## 2015-07-11 DIAGNOSIS — Z85118 Personal history of other malignant neoplasm of bronchus and lung: Secondary | ICD-10-CM | POA: Diagnosis not present

## 2015-07-11 DIAGNOSIS — Z7982 Long term (current) use of aspirin: Secondary | ICD-10-CM

## 2015-07-11 DIAGNOSIS — M05712 Rheumatoid arthritis with rheumatoid factor of left shoulder without organ or systems involvement: Secondary | ICD-10-CM

## 2015-07-11 DIAGNOSIS — M6281 Muscle weakness (generalized): Secondary | ICD-10-CM | POA: Diagnosis not present

## 2015-07-11 DIAGNOSIS — E876 Hypokalemia: Secondary | ICD-10-CM | POA: Diagnosis not present

## 2015-07-11 DIAGNOSIS — R410 Disorientation, unspecified: Secondary | ICD-10-CM | POA: Diagnosis not present

## 2015-07-11 DIAGNOSIS — G912 (Idiopathic) normal pressure hydrocephalus: Secondary | ICD-10-CM | POA: Diagnosis present

## 2015-07-11 DIAGNOSIS — Z794 Long term (current) use of insulin: Secondary | ICD-10-CM

## 2015-07-11 DIAGNOSIS — J9600 Acute respiratory failure, unspecified whether with hypoxia or hypercapnia: Secondary | ICD-10-CM | POA: Diagnosis not present

## 2015-07-11 DIAGNOSIS — K5900 Constipation, unspecified: Secondary | ICD-10-CM | POA: Diagnosis not present

## 2015-07-11 DIAGNOSIS — M810 Age-related osteoporosis without current pathological fracture: Secondary | ICD-10-CM | POA: Diagnosis not present

## 2015-07-11 DIAGNOSIS — R7309 Other abnormal glucose: Secondary | ICD-10-CM | POA: Diagnosis not present

## 2015-07-11 DIAGNOSIS — M06811 Other specified rheumatoid arthritis, right shoulder: Secondary | ICD-10-CM | POA: Diagnosis present

## 2015-07-11 DIAGNOSIS — N390 Urinary tract infection, site not specified: Secondary | ICD-10-CM | POA: Diagnosis not present

## 2015-07-11 DIAGNOSIS — M7989 Other specified soft tissue disorders: Secondary | ICD-10-CM

## 2015-07-11 DIAGNOSIS — M05711 Rheumatoid arthritis with rheumatoid factor of right shoulder without organ or systems involvement: Secondary | ICD-10-CM | POA: Diagnosis present

## 2015-07-11 DIAGNOSIS — E860 Dehydration: Secondary | ICD-10-CM | POA: Diagnosis not present

## 2015-07-11 DIAGNOSIS — D62 Acute posthemorrhagic anemia: Secondary | ICD-10-CM

## 2015-07-11 DIAGNOSIS — N3000 Acute cystitis without hematuria: Secondary | ICD-10-CM

## 2015-07-11 DIAGNOSIS — C3491 Malignant neoplasm of unspecified part of right bronchus or lung: Secondary | ICD-10-CM | POA: Diagnosis present

## 2015-07-11 DIAGNOSIS — S72142D Displaced intertrochanteric fracture of left femur, subsequent encounter for closed fracture with routine healing: Secondary | ICD-10-CM

## 2015-07-11 DIAGNOSIS — R339 Retention of urine, unspecified: Secondary | ICD-10-CM | POA: Diagnosis present

## 2015-07-11 DIAGNOSIS — G919 Hydrocephalus, unspecified: Secondary | ICD-10-CM | POA: Diagnosis not present

## 2015-07-11 DIAGNOSIS — R2689 Other abnormalities of gait and mobility: Secondary | ICD-10-CM | POA: Diagnosis not present

## 2015-07-11 DIAGNOSIS — Z7952 Long term (current) use of systemic steroids: Secondary | ICD-10-CM | POA: Diagnosis not present

## 2015-07-11 DIAGNOSIS — R739 Hyperglycemia, unspecified: Secondary | ICD-10-CM | POA: Diagnosis not present

## 2015-07-11 DIAGNOSIS — G301 Alzheimer's disease with late onset: Secondary | ICD-10-CM | POA: Diagnosis not present

## 2015-07-11 DIAGNOSIS — R278 Other lack of coordination: Secondary | ICD-10-CM | POA: Diagnosis not present

## 2015-07-11 DIAGNOSIS — Z4789 Encounter for other orthopedic aftercare: Secondary | ICD-10-CM | POA: Diagnosis not present

## 2015-07-11 DIAGNOSIS — R402411 Glasgow coma scale score 13-15, in the field [EMT or ambulance]: Secondary | ICD-10-CM | POA: Diagnosis not present

## 2015-07-11 LAB — BLOOD GAS, ARTERIAL
ACID-BASE EXCESS: 7.4 mmol/L — AB (ref 0.0–2.0)
BICARBONATE: 31.7 meq/L — AB (ref 20.0–24.0)
Drawn by: 257701
O2 CONTENT: 2 L/min
O2 Saturation: 96.1 %
PCO2 ART: 44.8 mmHg (ref 35.0–45.0)
PH ART: 7.464 — AB (ref 7.350–7.450)
Patient temperature: 98.6
TCO2: 28.5 mmol/L (ref 0–100)
pO2, Arterial: 83.3 mmHg (ref 80.0–100.0)

## 2015-07-11 LAB — CBC WITH DIFFERENTIAL/PLATELET
BASOS ABS: 0 10*3/uL (ref 0.0–0.1)
Basophils Relative: 0 %
EOS PCT: 0 %
Eosinophils Absolute: 0 10*3/uL (ref 0.0–0.7)
HEMATOCRIT: 31.5 % — AB (ref 36.0–46.0)
Hemoglobin: 10.4 g/dL — ABNORMAL LOW (ref 12.0–15.0)
LYMPHS ABS: 0.6 10*3/uL — AB (ref 0.7–4.0)
LYMPHS PCT: 5 %
MCH: 29.5 pg (ref 26.0–34.0)
MCHC: 33 g/dL (ref 30.0–36.0)
MCV: 89.2 fL (ref 78.0–100.0)
MONO ABS: 1 10*3/uL (ref 0.1–1.0)
MONOS PCT: 8 %
NEUTROS ABS: 10.8 10*3/uL — AB (ref 1.7–7.7)
NEUTROS PCT: 87 %
PLATELETS: 437 10*3/uL — AB (ref 150–400)
RBC: 3.53 MIL/uL — ABNORMAL LOW (ref 3.87–5.11)
RDW: 15.7 % — AB (ref 11.5–15.5)
WBC: 12.5 10*3/uL — AB (ref 4.0–10.5)

## 2015-07-11 LAB — COMPREHENSIVE METABOLIC PANEL
ALT: 19 U/L (ref 14–54)
AST: 21 U/L (ref 15–41)
Albumin: 2.9 g/dL — ABNORMAL LOW (ref 3.5–5.0)
Alkaline Phosphatase: 94 U/L (ref 38–126)
Anion gap: 9 (ref 5–15)
BILIRUBIN TOTAL: 1.3 mg/dL — AB (ref 0.3–1.2)
BUN: 36 mg/dL — AB (ref 6–20)
CHLORIDE: 98 mmol/L — AB (ref 101–111)
CO2: 32 mmol/L (ref 22–32)
Calcium: 9.3 mg/dL (ref 8.9–10.3)
Creatinine, Ser: 1.05 mg/dL — ABNORMAL HIGH (ref 0.44–1.00)
GFR, EST AFRICAN AMERICAN: 57 mL/min — AB (ref >60)
GFR, EST NON AFRICAN AMERICAN: 50 mL/min — AB (ref >60)
Glucose, Bld: 341 mg/dL — ABNORMAL HIGH (ref 65–99)
POTASSIUM: 3.3 mmol/L — AB (ref 3.5–5.1)
Sodium: 139 mmol/L (ref 135–145)
TOTAL PROTEIN: 6 g/dL — AB (ref 6.5–8.1)

## 2015-07-11 LAB — URINALYSIS, ROUTINE W REFLEX MICROSCOPIC
BILIRUBIN URINE: NEGATIVE
Glucose, UA: >1000 mg/dL — AB
Ketones, ur: NEGATIVE mg/dL
NITRITE: NEGATIVE
PROTEIN: NEGATIVE mg/dL
SPECIFIC GRAVITY, URINE: 1.016 (ref 1.005–1.030)
pH: 5.5 (ref 5.0–8.0)

## 2015-07-11 LAB — URINE MICROSCOPIC-ADD ON: Squamous Epithelial / LPF: NONE SEEN

## 2015-07-11 LAB — CBG MONITORING, ED: Glucose-Capillary: 335 mg/dL — ABNORMAL HIGH (ref 65–99)

## 2015-07-11 LAB — PROTIME-INR
INR: 1.11 (ref 0.00–1.49)
Prothrombin Time: 14.5 seconds (ref 11.6–15.2)

## 2015-07-11 LAB — GLUCOSE, CAPILLARY: Glucose-Capillary: 281 mg/dL — ABNORMAL HIGH (ref 65–99)

## 2015-07-11 LAB — TROPONIN I: TROPONIN I: 0.03 ng/mL (ref <0.031)

## 2015-07-11 LAB — I-STAT CG4 LACTIC ACID, ED: LACTIC ACID, VENOUS: 1.23 mmol/L (ref 0.5–2.0)

## 2015-07-11 MED ORDER — MIRABEGRON ER 50 MG PO TB24
50.0000 mg | ORAL_TABLET | Freq: Every day | ORAL | Status: DC
  Administered 2015-07-12 – 2015-07-15 (×4): 50 mg via ORAL
  Filled 2015-07-11 (×4): qty 1

## 2015-07-11 MED ORDER — DONEPEZIL HCL 23 MG PO TABS
23.0000 mg | ORAL_TABLET | Freq: Every day | ORAL | Status: DC
Start: 2015-07-11 — End: 2015-07-15
  Administered 2015-07-11 – 2015-07-14 (×4): 23 mg via ORAL
  Filled 2015-07-11 (×5): qty 1

## 2015-07-11 MED ORDER — POTASSIUM CHLORIDE CRYS ER 20 MEQ PO TBCR
40.0000 meq | EXTENDED_RELEASE_TABLET | Freq: Once | ORAL | Status: AC
  Administered 2015-07-11: 40 meq via ORAL
  Filled 2015-07-11: qty 2

## 2015-07-11 MED ORDER — SODIUM CHLORIDE 0.9 % IV SOLN
1000.0000 mL | Freq: Once | INTRAVENOUS | Status: AC
  Administered 2015-07-11: 1000 mL via INTRAVENOUS

## 2015-07-11 MED ORDER — PREDNISONE 5 MG PO TABS
5.0000 mg | ORAL_TABLET | Freq: Every day | ORAL | Status: DC
  Administered 2015-07-12 – 2015-07-15 (×4): 5 mg via ORAL
  Filled 2015-07-11 (×4): qty 1

## 2015-07-11 MED ORDER — CALCIUM CARBONATE-VITAMIN D 500-200 MG-UNIT PO TABS
1.0000 | ORAL_TABLET | Freq: Two times a day (BID) | ORAL | Status: DC
  Administered 2015-07-12 – 2015-07-15 (×8): 1 via ORAL
  Filled 2015-07-11 (×9): qty 1

## 2015-07-11 MED ORDER — ACETAMINOPHEN 650 MG RE SUPP
650.0000 mg | Freq: Four times a day (QID) | RECTAL | Status: DC | PRN
Start: 2015-07-11 — End: 2015-07-15

## 2015-07-11 MED ORDER — INSULIN GLARGINE 100 UNIT/ML ~~LOC~~ SOLN
30.0000 [IU] | Freq: Every day | SUBCUTANEOUS | Status: DC
  Filled 2015-07-11: qty 0.3

## 2015-07-11 MED ORDER — ATORVASTATIN CALCIUM 40 MG PO TABS
40.0000 mg | ORAL_TABLET | Freq: Every day | ORAL | Status: DC
  Administered 2015-07-12 – 2015-07-14 (×4): 40 mg via ORAL
  Filled 2015-07-11 (×4): qty 1

## 2015-07-11 MED ORDER — ACETAMINOPHEN 325 MG PO TABS: 650.0000 mg | ORAL_TABLET | Freq: Four times a day (QID) | ORAL | Status: DC | PRN

## 2015-07-11 MED ORDER — OXYCODONE HCL 5 MG PO TABS: 5.0000 mg | ORAL_TABLET | ORAL | Status: DC | PRN

## 2015-07-11 MED ORDER — VANCOMYCIN HCL IN DEXTROSE 1-5 GM/200ML-% IV SOLN
1000.0000 mg | Freq: Once | INTRAVENOUS | Status: AC
  Administered 2015-07-11: 1000 mg via INTRAVENOUS
  Filled 2015-07-11: qty 200

## 2015-07-11 MED ORDER — SODIUM CHLORIDE 0.9 % IV BOLUS (SEPSIS)
1000.0000 mL | Freq: Once | INTRAVENOUS | Status: AC
  Administered 2015-07-11: 1000 mL via INTRAVENOUS

## 2015-07-11 MED ORDER — INSULIN ASPART 100 UNIT/ML ~~LOC~~ SOLN
0.0000 [IU] | Freq: Three times a day (TID) | SUBCUTANEOUS | Status: DC
  Administered 2015-07-12: 5 [IU] via SUBCUTANEOUS
  Administered 2015-07-12: 2 [IU] via SUBCUTANEOUS
  Administered 2015-07-12: 3 [IU] via SUBCUTANEOUS
  Administered 2015-07-13 (×2): 5 [IU] via SUBCUTANEOUS
  Administered 2015-07-13: 3 [IU] via SUBCUTANEOUS
  Administered 2015-07-14: 5 [IU] via SUBCUTANEOUS
  Administered 2015-07-14 (×2): 8 [IU] via SUBCUTANEOUS
  Administered 2015-07-15: 3 [IU] via SUBCUTANEOUS
  Administered 2015-07-15: 5 [IU] via SUBCUTANEOUS

## 2015-07-11 MED ORDER — GADOBENATE DIMEGLUMINE 529 MG/ML IV SOLN
15.0000 mL | Freq: Once | INTRAVENOUS | Status: AC | PRN
  Administered 2015-07-11: 15 mL via INTRAVENOUS

## 2015-07-11 MED ORDER — INSULIN GLARGINE 100 UNIT/ML ~~LOC~~ SOLN
15.0000 [IU] | Freq: Once | SUBCUTANEOUS | Status: AC
  Administered 2015-07-11: 15 [IU] via SUBCUTANEOUS
  Filled 2015-07-11: qty 0.15

## 2015-07-11 MED ORDER — ENOXAPARIN SODIUM 40 MG/0.4ML ~~LOC~~ SOLN: 40.0000 mg | Freq: Every day | SUBCUTANEOUS | Status: DC

## 2015-07-11 MED ORDER — EZETIMIBE 10 MG PO TABS
10.0000 mg | ORAL_TABLET | Freq: Every day | ORAL | Status: DC
  Administered 2015-07-12 – 2015-07-15 (×4): 10 mg via ORAL
  Filled 2015-07-11 (×4): qty 1

## 2015-07-11 MED ORDER — ASPIRIN EC 81 MG PO TBEC: 81.0000 mg | DELAYED_RELEASE_TABLET | Freq: Every day | ORAL | Status: DC

## 2015-07-11 MED ORDER — INSULIN ASPART 100 UNIT/ML ~~LOC~~ SOLN
0.0000 [IU] | Freq: Every day | SUBCUTANEOUS | Status: DC
  Administered 2015-07-11: 3 [IU] via SUBCUTANEOUS
  Administered 2015-07-13: 2 [IU] via SUBCUTANEOUS

## 2015-07-11 MED ORDER — SODIUM CHLORIDE 0.9 % IV SOLN
INTRAVENOUS | Status: DC
  Administered 2015-07-12: 1000 mL via INTRAVENOUS
  Administered 2015-07-12: 01:00:00 via INTRAVENOUS
  Administered 2015-07-13 – 2015-07-14 (×2): 1000 mL via INTRAVENOUS
  Administered 2015-07-15: via INTRAVENOUS

## 2015-07-11 MED ORDER — MEMANTINE HCL 10 MG PO TABS
10.0000 mg | ORAL_TABLET | Freq: Two times a day (BID) | ORAL | Status: DC
  Administered 2015-07-11 – 2015-07-15 (×8): 10 mg via ORAL
  Filled 2015-07-11 (×9): qty 1

## 2015-07-11 MED ORDER — LEVOFLOXACIN IN D5W 750 MG/150ML IV SOLN
750.0000 mg | Freq: Once | INTRAVENOUS | Status: AC
  Administered 2015-07-11: 750 mg via INTRAVENOUS
  Filled 2015-07-11: qty 150

## 2015-07-11 MED ORDER — PROMETHAZINE HCL 25 MG PO TABS: 12.5000 mg | ORAL_TABLET | Freq: Four times a day (QID) | ORAL | Status: DC | PRN

## 2015-07-11 NOTE — H&P (Signed)
History and Physical    VANIAH CHAMBERS TMH:[id number] DOB: 06/12/1934 DOA: 07/11/2015  PCP: Reginia Naas, MD   Patient coming from: SNF  Chief Complaint: Altered mental status  HPI: CLAIR BARDWELL is a 81 y.o. woman with a history of dementia, RA, DM, dyslipidemia, and lung cancer who is S/P surgical intervention for left intertrochanteric hip fracture at [facility] on 5/13.  She was discharged to SNF on 5/19.  Of note, she was discharged on supplement oxygen, which is new for her (no coinciding diagnosis per family).  The patient is obtunded and unable to give any clinical history.  Her husband reports that she has acute mental status changes earlier today.  She did not eat well with breakfast, and subsequently developed nausea and vomiting.  She was found to have abdominal distention.  No fever.  No indication of chest pain or shortness of breath.  No cough.  ED Course: The patient was found to have acute urinary retention (greater than 1L of urine in her bladder).  U/A shows mild leukocytes but TNTC WBC; nitrite negative.  Chest xray interpreted as possible LLL infiltrate.  She has a mildy elevated WBC count to 12.5 but no fever.  Hospitalist asked to admit for acute encephalopathy attributed to UTI.  Upon my arrival, the patient was quite lethargic and difficult to arouse.  Her husband reported her mental status changes seemed to worsening.  Imaging reviewed and head CT concerning for hydrocephalus (last head CT in this system for comparison was 2008).  These findings were discussed with the neurohospitalist and neurosurgeon on-call.  The CT images alone are inconclusive for the chronicity of these findings.  Further work-up recommended was recommended.  Family was updated and willing to proceed, with her husband giving consent as needed.  MRI was done emergently.  Findings on MRI suggest that hydrocephalus changes are likely chronic.  She has changes consistent with moderate to severe  small vessel ischemic disease.  There is also an incidental finding of two old, small cerebellar infarcts.  Results were discussed with neurosurgery on call.  Since hydrocephalus changes are likely chronic, there is less urgency for LP tonight, though this test is still recommended to document opening pressure.    Proceed with admission to med/surg.  While further evaluation was taking place in the ED, the patient has actually demonstrated improvement in her mental status.  Now responds to voice; knows that she is in the hospital.  Review of Systems: Family admits to gait abnormalities for at least the past year, with progressive changes recently, which they associate with her fall and hip fracture earlier this month.  As per HPI otherwise 10 point review of systems negative.   Past Medical History  Diagnosis Date  . Diabetes mellitus   . Rheumatoid arthritis(714.0)   . Colonic polyp   . Neuropathy ([place])     peripheral  . Goiter     with hypothyrodism  . Osteopenia   . Hyperlipemia   . Lymph node disorder 10R,11R    excision  . Memory loss   . Wears glasses     reading  . Cancer ([place])   . Carcinoma ([place])     lung cell-stage1    Past Surgical History  Procedure Laterality Date  . Cesarean section    . Lung surgery Right 2006    recurrance 2009,lower lobe superior segment,resection:2007  . Cataract extraction Bilateral   . Wrist surgery Right   . Foot surgery Right  x3  . Lung surgery Right     secondary lower lobe margin, excision,no tumor indentified  . Lesion removal Right 09/13/2013    Procedure: EXCISION OF RIGHT LEG SQUAMOUS CELL CARCINOMA/RIGHT ARM LESION WITH ACELL;  Surgeon: Theodoro Kos, DO;  Location: [facility];  Service: Plastics;  Laterality: Right;     reports that she quit smoking about 10 years ago. She has never used smokeless tobacco. She reports that she does not drink alcohol or use illicit drugs.  She is married.  She has three adult  children.  Still living at home prior to fall and hip fracture.  Still ambulated independently prior to hip fracture.  Allergies  Allergen Reactions  . Amoxicillin Nausea And Vomiting  . Cefaclor Nausea And Vomiting  . Hydrocodone Nausea And Vomiting  . Sulfa Antibiotics     " legs give out"    No family history on file.  Prior to Admission medications   Medication Sig Start Date End Date Taking? Authorizing Provider  aspirin 81 MG tablet Take 81 mg by mouth daily.    Yes Historical Provider, MD  atorvastatin (LIPITOR) 40 MG tablet Take 40 mg by mouth daily. 03/25/12  Yes Historical Provider, MD  Calcium Carbonate-Vitamin D (CALCIUM-VITAMIN D) 500-200 MG-UNIT tablet Take 1 tablet by mouth 2 (two) times daily.   Yes Historical Provider, MD  donepezil (ARICEPT) 23 MG TABS tablet TAKE 1 TABLET (23 MG TOTAL) BY MOUTH AT BEDTIME. 05/02/15  Yes Carmen Dohmeier, MD  enoxaparin (LOVENOX) 40 MG/0.4ML injection Inject 40 mg into the skin daily.  07/05/15 08/07/15 Yes Historical Provider, MD  ezetimibe (ZETIA) 10 MG tablet Take 10 mg by mouth daily.   Yes Historical Provider, MD  insulin glargine (LANTUS) 100 UNIT/ML injection Inject 43 Units into the skin daily.   Yes Historical Provider, MD  insulin lispro (HUMALOG) 100 UNIT/ML injection Inject 3 Units into the skin 3 (three) times daily with meals.  07/05/15  Yes Historical Provider, MD  memantine (NAMENDA) 10 MG tablet TAKE 1 TABLET (10 MG TOTAL) BY MOUTH 2 (TWO) TIMES DAILY. 05/02/15  Yes Carmen Dohmeier, MD  MYRBETRIQ 50 MG TB24 tablet Take 50 mg by mouth daily. 05/07/15  Yes Historical Provider, MD  oxycodone (OXY-IR) 5 MG capsule Take 5 mg by mouth every 4 (four) hours as needed for pain.   Yes Historical Provider, MD  predniSONE (DELTASONE) 5 MG tablet Take 5 mg by mouth daily.   Yes Historical Provider, MD  sodium phosphate (FLEET) enema Place 1 enema rectally daily as needed (constipation). follow package directions   Yes Historical Provider, MD    Vitamin D, Ergocalciferol, (DRISDOL) 50000 UNITS CAPS capsule Take 1 capsule by mouth once a week. 07/23/13   Historical Provider, MD    Physical Exam: Filed Vitals:   07/11/15 1505 07/11/15 1519 07/11/15 1718 07/11/15 2148  BP: 151/56  143/40 152/51  Pulse: 91  87 86  Temp: 98.7 F (37.1 C)  98.6 F (37 C) 98.5 F (36.9 C)  TempSrc: Oral  Oral Oral  Resp: '16  18 18  '$ SpO2: 97% 91% 97% 100%     INITIAL EXAM IN THE ED: Constitutional: NAD, obtunded, arouses briefly with sternal rub, can get her to follow some commands when awake Filed Vitals:   07/11/15 1505 07/11/15 1519 07/11/15 1718 07/11/15 2148  BP: 151/56  143/40 152/51  Pulse: 91  87 86  Temp: 98.7 F (37.1 C)  98.6 F (37 C) 98.5 F (36.9  C)  TempSrc: Oral  Oral Oral  Resp: '16  18 18  '$ SpO2: 97% 91% 97% 100%   Eyes: PERRL, lids and conjunctivae normal ENMT: Mucous membranes are slightly dry.  Patient too lethargic to fully open her mouth/comply with exam. Neck: normal, supple Respiratory: clear to auscultation listening anteriorly, no wheezing, no crackles. Normal respiratory effort. No accessory muscle use.  Cardiovascular: Regular rate and rhythm, no murmurs / rubs / gallops. No extremity edema. 2+ pedal pulses. Abdomen: Distention improved now that bladder has been decompressed with in-and-out cath.  No masses palpated. Bowel sounds positive.  Musculoskeletal: no clubbing / cyanosis. No joint deformity upper and lower extremities. Good ROM, no contractures. Normal muscle tone.  Skin: Dressing to left hip C/D/I Neurologic: CN 2-12 grossly intact. Sensation intact, DTR normal. Strength 5/5 in all 4.  Psychiatric: Normal judgment and insight. Alert and oriented x 3. Normal mood.    Labs on Admission: I have personally reviewed following labs and imaging studies  CBC:  Recent Labs Lab 07/11/15 1519  WBC 12.5*  NEUTROABS 10.8*  HGB 10.4*  HCT 31.5*  MCV 89.2  PLT 767*   Basic Metabolic Panel:  Recent  Labs Lab 07/11/15 1519  NA 139  K 3.3*  CL 98*  CO2 32  GLUCOSE 341*  BUN 36*  CREATININE 1.05*  CALCIUM 9.3   GFR: Estimated Creatinine Clearance: 43.7 mL/min (by C-G formula based on Cr of 1.05). Liver Function Tests:  Recent Labs Lab 07/11/15 1519  AST 21  ALT 19  ALKPHOS 94  BILITOT 1.3*  PROT 6.0*  ALBUMIN 2.9*   Cardiac Enzymes:  Recent Labs Lab 07/11/15 1519  TROPONINI 0.03   CBG:  Recent Labs Lab 07/11/15 1509  GLUCAP 335*   Urine analysis:    Component Value Date/Time   COLORURINE YELLOW 07/11/2015 1654   APPEARANCEUR CLOUDY* 07/11/2015 1654   LABSPEC 1.016 07/11/2015 1654   LABSPEC 1.005 09/21/2007 1410   PHURINE 5.5 07/11/2015 1654   PHURINE 7.0 09/21/2007 1410   GLUCOSEU >1000* 07/11/2015 1654   HGBUR SMALL* 07/11/2015 1654   HGBUR Trace 09/21/2007 1410   BILIRUBINUR NEGATIVE 07/11/2015 1654   BILIRUBINUR Negative 09/21/2007 1410   KETONESUR NEGATIVE 07/11/2015 1654   KETONESUR Negative 09/21/2007 1410   PROTEINUR NEGATIVE 07/11/2015 1654   PROTEINUR < 30 09/21/2007 1410   UROBILINOGEN 0.2 09/01/2011 1746   NITRITE NEGATIVE 07/11/2015 1654   NITRITE Positive 09/21/2007 1410   LEUKOCYTESUR SMALL* 07/11/2015 1654   LEUKOCYTESUR Moderate 09/21/2007 1410   Sepsis Labs:  First lactic acid normal  Radiological Exams on Admission: Dg Chest 2 View  07/11/2015  CLINICAL DATA:  Lethargy and lack of appetite. Dementia. History of lung cancer post surgery. EXAM: CHEST  2 VIEW COMPARISON:  11/30/2007 and CT 04/09/2015 FINDINGS: Lungs are adequately inflated and demonstrate stable postsurgical changes over the right perihilar region and upper lung. Suggestion mild opacification of the posterior left base/retrocardiac region. Focal pleural fluid collection posteriorly of the right upper thorax unchanged from the recent CT. Cardiomediastinal silhouette is within normal. Remainder of the exam is unchanged. IMPRESSION: Mild opacification in the left  base/retrocardiac region which may represent atelectasis or infection. Stable postsurgical changes of the right perihilar region and upper lung. Electronically Signed   By: Marin Olp M.D.   On: 07/11/2015 16:01   Ct Head Wo Contrast  07/11/2015  CLINICAL DATA:  Lethargy today, dementia, fell last week EXAM: CT HEAD WITHOUT CONTRAST TECHNIQUE: Contiguous axial images were obtained  from the base of the skull through the vertex without intravenous contrast. COMPARISON:  03/21/2006 FINDINGS: There is diffuse cortical atrophy with low attenuation in the deep white matter. These findings were present previously but have progressed. There is prominence of the lateral ventricle. Portion allergy to atrophy is difficult to determine, but the temporal horns are prominent. Third ventricle is also prominent as is the fourth. There is no evidence of infarct or mass. There is no hemorrhage or extra-axial fluid. The calvarium is intact. IMPRESSION: Age-related involutional change. Additionally, the ventricles are prominent and proportionality to atrophy is difficult to determine. In particular the temporal horns are prominent which raises concern for hydrocephalus. Third and fourth ventricles also appear prominent and the possibility of communicating hydrocephalus is not excluded. Electronically Signed   By: Skipper Cliche M.D.   On: 07/11/2015 15:55   Mr Jeri Cos WE Contrast  07/11/2015  CLINICAL DATA:  Lethargy, decreased appetite for 1 day. Hyperglycemia. History of dementia. EXAM: MRI HEAD WITHOUT AND WITH CONTRAST TECHNIQUE: Multiplanar, multiecho pulse sequences of the brain and surrounding structures were obtained without and with intravenous contrast. CONTRAST:  59m MULTIHANCE GADOBENATE DIMEGLUMINE 529 MG/ML IV SOLN COMPARISON:  CT HEAD Jul 11, 2015 at 1540 hours and MRI head March 22, 2006 FINDINGS: INTRACRANIAL CONTENTS: No reduced diffusion to suggest acute ischemia. No susceptibility artifact to suggest  hemorrhage. Moderate to severe ventriculomegaly with disproportionate sulcal effacement at the convexities. Patchy to confluent supratentorial white matter FLAIR T2 hyperintensities. 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 No midline shift, mass effect, mass lesions nor abnormal intraparenchymal enhancement the post gadolinium sequences are moderately motion degraded. Major intracranial vascular flow voids present at skull base. No abnormal extra-axial fluid collections or abnormal enhancement though motion degrades sensitivity. ORBITS: The included ocular globes and orbital contents are non-suspicious. Status post bilateral ocular lens implants. SINUSES: Paranasal sinuses are well-aerated. Hypoplastic frontal sinuses. Trace RIGHT mastoid effusion. SKULL/SOFT TISSUES: No abnormal sellar expansion. No suspicious calvarial bone marrow signal. Craniocervical junction maintained. IMPRESSION: Moderate to severe hydrocephalus, likely on a chronic basis (normal pressure hydrocephalus). Moderate to severe white matter changes most commonly seen with chronic small vessel ischemic disease, there could be a component interstitial edema. Old small cerebellar infarcts. Electronically Signed   By: CElon AlasM.D.   On: 07/11/2015 22:17    EKG: Independently reviewed. NSR, no acute ST segment changes  Assessment/Plan Principal Problem:   Acute encephalopathy Active Problems:   Cancer of right lung (HCC)   Rheumatoid arthritis involving both shoulders with positive rheumatoid factor (HCC)   UTI (lower urinary tract infection)   Dementia   Diabetes (HCC)   Hypokalemia   Acute blood loss anemia   Hyperglycemia   Hydrocephalus  Acute encephalopathy secondary to UTI --IV levaquin initiated in the ED.  Vancomycin added to broaden antibiotic spectrum due to recent hospitalization (with catheterization) and increased risk for healthcare associated pathogens --Blood and urine cultures pending --Patient does  not appear to be overly septic --Maintenance fluids with NS  Hydrocephalus, likely chronic --Will need to arrange LP (with documentation of opening pressures, routine CSF studies), coordinated with PT assessments before and after, in the morning.  Neurosurgery would need to be called again if opening pressures are elevated.  Since changes on MRI appear chronic, there is low clinical index of suspicion for meningitis.  However, if patient shows clinical deterioration, repeat head CT would be indicated to make sure that there hasn't been an interval increase in ventricle size.  If she remains stable, will still need outpatient neurosurgery referral.  Incidental finding of old cerebellar strokes (I did not discuss this finding explicitly with family). --Patient on aspirin and statin at baseline.  Can likely just follow-up with primary neurologist.  Consider checking fasting lipid panel to see statin dose needs to be increased.  Consider adding ACE-I for blood pressure. --PT eval and treat  Recent fall with hip fracture --Continue PT --Lovenox for DVT prophylaxis --Analgesics as needed  History of diabetes, exacerbated by infection --Will continue slightly lower dose of lantus and monitor PO intake --Sliding scale insulin coverage AC/HS --Check A1c  RA --Continue prednisone  Dementia --Continue aricept and namenda  DVT prophylaxis: Lovenox (S/P hip fracture) Code Status: FULL Family Communication: Husband, daughter at bedside Disposition Plan: To be determined Consults called: No official consults yet, case has been discussed with neurology and neurosurgery, further testing pending Admission status: Inpatient, med surg  Time spent: 70 minutes, including conferring with consultants and discussing plan of care with family  Eber Jones MD [facility]  If 7PM-7AM, please contact night-coverage [email or web address] Password [facility]  07/11/2015, 9:49 PM

## 2015-07-11 NOTE — ED Notes (Signed)
Dr. Carter at bedside.

## 2015-07-11 NOTE — ED Notes (Signed)
Unable to obtain 2nd blood culture

## 2015-07-11 NOTE — Progress Notes (Signed)
*  Preliminary Results* Left lower extremity venous duplex completed. Study was technically difficult and limited due to poor patient compliance and patient position. Left lower extremity is negative for deep vein thrombosis. There is no evidence of left Baker's cyst.  07/11/2015 4:36 PM  Maudry Mayhew, RVT, RDCS, RDMS

## 2015-07-11 NOTE — ED Notes (Signed)
Bed: TN53 Expected date:  Expected time: 2:28 PM Means of arrival:  Comments: hyperglycemia

## 2015-07-11 NOTE — ED Provider Notes (Signed)
CSN: [id number]     Arrival date & time 07/11/15  1446 History   First MD Initiated Contact with Patient 07/11/15 1447     Chief Complaint  Patient presents with  . Hyperglycemia  . Fatigue   PT HAS BEEN AT [facility] FOR THE PAST FEW DAYS.  SHE FELL AND SUSTAINED A LEFT INTERTROCHANTERIC HIP FRACTURE ON 5/13.  SHE HAD IT REPAIRED AT [place] D/C'D ON THE 19TH.  [place], SHE DID HAVE SOME DELIRIUM WHICH WAS THOUGHT TO BE DUE TO NARCOTIC USE.  PT WAS BACK TO [facility]ELINE UPON D/C.  THE PT WAS ALSO D/C'D ON OXYGEN AS THEY HAD ISSUES WITH KEEPING HER OXYGEN UP.  THE PT ALSO HAS A HX OF DEMENTIA AND IS UNABLE TO GIVE ANY HX.  EMS SAID THAT PT'S BS HAS BEEN ELEVATED.  NH HAS BEEN COVERING HER WITH INSULIN.  THE NH ALSO SAID THAT PT HAS BEEN MORE CONFUSED THAN NL.  I SPOKE WITH PT'S FAMILY AND SHE IS NORMALLY AWAKE AND ALERT AND VERY TALKATIVE.  THEY SAID SHE'S BEEN "OUT OF IT"  ALL DAY TODAY.  (Consider location/radiation/quality/duration/timing/severity/associated sxs/prior Treatment) Patient is a 81 y.o. female presenting with hyperglycemia. The history is provided by the EMS personnel.  Hyperglycemia Blood sugar level PTA:  369 Severity:  Moderate Onset quality:  Gradual Timing:  Constant Progression:  Unchanged Chronicity:  Chronic Diabetes status:  Controlled with insulin Context: recent illness   Relieved by:  Nothing Ineffective treatments:  Insulin   Past Medical History  Diagnosis Date  . Diabetes mellitus   . Rheumatoid arthritis(714.0)   . Colonic polyp   . Neuropathy ([place])     peripheral  . Goiter     with hypothyrodism  . Osteopenia   . Hyperlipemia   . Lymph node disorder 10R,11R    excision  . Memory loss   . Wears glasses     reading  . Cancer ([place])   . Carcinoma ([place])     lung cell-stage1   Past Surgical History  Procedure Laterality Date  . Cesarean section    . Lung surgery Right 2006    recurrance 2009,lower lobe superior  segment,resection:2007  . Cataract extraction Bilateral   . Wrist surgery Right   . Foot surgery Right     x3  . Lung surgery Right     secondary lower lobe margin, excision,no tumor indentified  . Lesion removal Right 09/13/2013    Procedure: EXCISION OF RIGHT LEG SQUAMOUS CELL CARCINOMA/RIGHT ARM LESION WITH ACELL;  Surgeon: Theodoro Kos, DO;  Location: [facility];  Service: Plastics;  Laterality: Right;   No family history on file. Social History  Substance Use Topics  . Smoking status: Former Smoker    Quit date: 01/16/2005  . Smokeless tobacco: Never Used  . Alcohol Use: No   OB History    No data available     Review of Systems  Unable to perform ROS: Dementia  Endocrine:       ELEVATED BS      Allergies  Amoxicillin; Cefaclor; Hydrocodone; and Sulfa antibiotics  Home Medications   Prior to Admission medications   Medication Sig Start Date End Date Taking? Authorizing Provider  aspirin 81 MG tablet Take 81 mg by mouth daily.    Yes Historical Provider, MD  atorvastatin (LIPITOR) 40 MG tablet Take 40 mg by mouth daily. 03/25/12  Yes Historical Provider, MD  Calcium Carbonate-Vitamin D (CALCIUM-VITAMIN D)  500-200 MG-UNIT tablet Take 1 tablet by mouth 2 (two) times daily.   Yes Historical Provider, MD  donepezil (ARICEPT) 23 MG TABS tablet TAKE 1 TABLET (23 MG TOTAL) BY MOUTH AT BEDTIME. 05/02/15  Yes Carmen Dohmeier, MD  enoxaparin (LOVENOX) 40 MG/0.4ML injection Inject 40 mg into the skin daily.  07/05/15 08/07/15 Yes Historical Provider, MD  ezetimibe (ZETIA) 10 MG tablet Take 10 mg by mouth daily.   Yes Historical Provider, MD  insulin glargine (LANTUS) 100 UNIT/ML injection Inject 43 Units into the skin daily.   Yes Historical Provider, MD  insulin lispro (HUMALOG) 100 UNIT/ML injection Inject 3 Units into the skin 3 (three) times daily with meals.  07/05/15  Yes Historical Provider, MD  memantine (NAMENDA) 10 MG tablet TAKE 1 TABLET (10 MG TOTAL) BY  MOUTH 2 (TWO) TIMES DAILY. 05/02/15  Yes Carmen Dohmeier, MD  MYRBETRIQ 50 MG TB24 tablet Take 50 mg by mouth daily. 05/07/15  Yes Historical Provider, MD  oxycodone (OXY-IR) 5 MG capsule Take 5 mg by mouth every 4 (four) hours as needed for pain.   Yes Historical Provider, MD  predniSONE (DELTASONE) 5 MG tablet Take 5 mg by mouth daily.   Yes Historical Provider, MD  sodium phosphate (FLEET) enema Place 1 enema rectally daily as needed (constipation). follow package directions   Yes Historical Provider, MD  Vitamin D, Ergocalciferol, (DRISDOL) 50000 UNITS CAPS capsule Take 1 capsule by mouth once a week. 07/23/13   Historical Provider, MD   BP 143/40 mmHg  Pulse 87  Temp(Src) 98.6 F (37 C) (Oral)  Resp 18  SpO2 97% Physical Exam  Constitutional: She appears well-developed and well-nourished. She appears listless.  HENT:  Head: Normocephalic and atraumatic.  Right Ear: External ear normal.  Left Ear: External ear normal.  Nose: Nose normal.  Mouth/Throat: Oropharynx is clear and moist.  Eyes: Conjunctivae and EOM are normal. Pupils are equal, round, and reactive to light.  Neck: Normal range of motion. Neck supple.  Cardiovascular: Normal rate, regular rhythm, normal heart sounds and intact distal pulses.   Pulmonary/Chest: Effort normal and breath sounds normal.  Abdominal: Soft. Bowel sounds are normal.  Musculoskeletal: Normal range of motion.  Neurological: She appears listless.  PT IS AWAKE AND WILL LOOK AT YOU AND FOLLOW SIMPLE COMMANDS.  SHE IS NONVERBAL NOW.  GRIP STRENGTH IS EQUAL BILATERALLY.    Skin:     Psychiatric: She is slowed.  Nursing note and vitals reviewed.   ED Course  Procedures (including critical care time) Labs Review Labs Reviewed  CBC WITH DIFFERENTIAL/PLATELET - Abnormal; Notable for the following:    WBC 12.5 (*)    RBC 3.53 (*)    Hemoglobin 10.4 (*)    HCT 31.5 (*)    RDW 15.7 (*)    Platelets 437 (*)    Neutro Abs 10.8 (*)    Lymphs Abs 0.6  (*)    All other components within normal limits  COMPREHENSIVE METABOLIC PANEL - Abnormal; Notable for the following:    Potassium 3.3 (*)    Chloride 98 (*)    Glucose, Bld 341 (*)    BUN 36 (*)    Creatinine, Ser 1.05 (*)    Total Protein 6.0 (*)    Albumin 2.9 (*)    Total Bilirubin 1.3 (*)    GFR calc non Af Amer 50 (*)    GFR calc Af Amer 57 (*)    All other components within normal limits  URINALYSIS,  ROUTINE W REFLEX MICROSCOPIC (NOT AT [facility]) - Abnormal; Notable for the following:    APPearance CLOUDY (*)    Glucose, UA >1000 (*)    Hgb urine dipstick SMALL (*)    Leukocytes, UA SMALL (*)    All other components within normal limits  BLOOD GAS, ARTERIAL - Abnormal; Notable for the following:    pH, Arterial 7.464 (*)    Bicarbonate 31.7 (*)    Acid-Base Excess 7.4 (*)    All other components within normal limits  URINE MICROSCOPIC-ADD ON - Abnormal; Notable for the following:    Bacteria, UA MANY (*)    All other components within normal limits  CBG MONITORING, ED - Abnormal; Notable for the following:    Glucose-Capillary 335 (*)    All other components within normal limits  URINE CULTURE  CULTURE, BLOOD (ROUTINE X 2)  CULTURE, BLOOD (ROUTINE X 2)  TROPONIN I  I-STAT CG4 LACTIC ACID, ED    Imaging Review Dg Chest 2 View  07/11/2015  CLINICAL DATA:  Lethargy and lack of appetite. Dementia. History of lung cancer post surgery. EXAM: CHEST  2 VIEW COMPARISON:  11/30/2007 and CT 04/09/2015 FINDINGS: Lungs are adequately inflated and demonstrate stable postsurgical changes over the right perihilar region and upper lung. Suggestion mild opacification of the posterior left base/retrocardiac region. Focal pleural fluid collection posteriorly of the right upper thorax unchanged from the recent CT. Cardiomediastinal silhouette is within normal. Remainder of the exam is unchanged. IMPRESSION: Mild opacification in the left base/retrocardiac region which may represent atelectasis  or infection. Stable postsurgical changes of the right perihilar region and upper lung. Electronically Signed   By: Marin Olp M.D.   On: 07/11/2015 16:01   Ct Head Wo Contrast  07/11/2015  CLINICAL DATA:  Lethargy today, dementia, fell last week EXAM: CT HEAD WITHOUT CONTRAST TECHNIQUE: Contiguous axial images were obtained from the base of the skull through the vertex without intravenous contrast. COMPARISON:  03/21/2006 FINDINGS: There is diffuse cortical atrophy with low attenuation in the deep white matter. These findings were present previously but have progressed. There is prominence of the lateral ventricle. Portion allergy to atrophy is difficult to determine, but the temporal horns are prominent. Third ventricle is also prominent as is the fourth. There is no evidence of infarct or mass. There is no hemorrhage or extra-axial fluid. The calvarium is intact. IMPRESSION: Age-related involutional change. Additionally, the ventricles are prominent and proportionality to atrophy is difficult to determine. In particular the temporal horns are prominent which raises concern for hydrocephalus. Third and fourth ventricles also appear prominent and the possibility of communicating hydrocephalus is not excluded. Electronically Signed   By: Skipper Cliche M.D.   On: 07/11/2015 15:55   I have personally reviewed and evaluated these images and lab results as part of my medical decision-making. DVT STUDY NEGATIVE.  EKG Interpretation   Date/Time:  Thursday Jul 11 2015 15:14:16 EDT Ventricular Rate:  92 PR Interval:  134 QRS Duration: 76 QT Interval:  502 QTC Calculation: 621 R Axis:   21 Text Interpretation:  Sinus rhythm Atrial premature complex Borderline  repolarization abnormality Prolonged QT interval Baseline wander in  lead(s) II III aVF Confirmed by Tanija Germani MD, Ianmichael Amescua ([id number]) on 07/11/2015  3:43:03 PM      MDM  PT HAD 1400 CC OF URINE IN BLADDER WITH I/O CATH.  DUE TO THIS, I HAD THE  NURSE PLACE A FOLEY.  FAMILY DOES NOT WANT TO GO BACK TO  [facility], THEY WANT TO STAY HERE.  PT D/W DR. Lexine Baton [facility] ([facility]) WHO WILL ADMIT PT TO [place]. Final diagnoses:  Poorly controlled type 2 diabetes mellitus ([place])  Acute cystitis without hematuria  Acute delirium      Isla Pence, MD 07/11/15 1820

## 2015-07-11 NOTE — ED Notes (Signed)
Per Dr. Eulas Post, pt to be held in ED until MRI result for possible lumbar puncture by Dr. Tamera Punt.

## 2015-07-11 NOTE — ED Provider Notes (Signed)
Dr. Eulas Post requested that I do an LP to check for hydrocephalus after CT scan shows possible hydrocephalus.  This was pending MR.  MR brain shows mod to severe hydrocephalus, likely chronic.  I spoke with Dr. Nicole Kindred who said that LP should be done more in conjunction with PT, having an evaluation prior to LP and after LP to assess for improvement, rather than being done in the ED.  I discussed this with Dr. Eulas Post.  Malvin Johns, MD 07/11/15 2232

## 2015-07-11 NOTE — Clinical Social Work Note (Signed)
Clinical Social Work Assessment  Patient Details  Name: Marcia [name] MRN: [id number] Date of Birth: 1934-12-03  Date of referral:  07/11/15               Reason for consult:   (Patient from facility.)                Permission sought to share information with:   (None.) Permission granted to share information::  No  Name::        Agency::     Relationship::     Contact Information:     Housing/Transportation Living arrangements for the past 2 months:   (Family states pt has been at [facility] facility since she fell 2 weeks ago.) Source of Information:  Spouse, Adult Children Patient Interpreter Needed:  None Criminal Activity/Legal Involvement Pertinent to Current Situation/Hospitalization:  No - Comment as needed Significant Relationships:  Adult Children, Spouse Lives with:  Facility Resident ([facility]) Do you feel safe going back to the place where you live?  Yes Need for family participation in patient care:  Yes (Comment) (Patient has a good support system.)  Care giving concerns:  There are no care giving concerns at this time. The patient is a resident at [facility] and family reports that she receives PT there.   Social Worker assessment / plan:  CSW attempted to speak with pt at bedside. However, pt appeared to be sleepy. Husband and daughter were present. They state pt presents to [facility] due to high sugar. Also, they informed CSW that pt fell 2 weeks ago. Husband states pt needs assistance with completing ADL's and is using a walker while receiving PT.   Family states that pt does not fall often. According to family, pt does not fall often.  Husband/ Marcia [name] [phone]  Daughter/Marcia [name] [phone]  Employment status:  [employer] Insurance information:  Medicare PT Recommendations:   (Family states pt has been receiving PT at facility.) Information / Referral to community resources:   (Patient is a resident at [facility]. Family also states that they  have hired a Actuary for pt.)  Patient/Family's Response to care:  Patient and family are aware of admission and are accepting at this time.   Patient/Family's Understanding of and Emotional Response to Diagnosis, Current Treatment, and Prognosis:  Patient and family have no questions for CSW.  Emotional Assessment Appearance:  Appears stated age Attitude/Demeanor/Rapport:   (Appropriate. Sleepy.) Affect (typically observed):  Appropriate Orientation:  Oriented to Self, Oriented to Place, Oriented to  Time, Oriented to Situation Alcohol / Substance use:  Not Applicable Psych involvement (Current and /or in the community):  No (Comment)  Discharge Needs  Concerns to be addressed:  Adjustment to Illness Readmission within the last 30 days:  No Current discharge risk:  None Barriers to Discharge:  No Barriers Identified   Bernita Buffy, LCSW 07/11/2015, 10:56 PM

## 2015-07-11 NOTE — ED Notes (Signed)
Per EMS, pt from [facility] has appeared lethargic with lack of appetite all day. Pt had elevated CBG for staff, was given insulin at 1130AM today. CBG for EMS was 369. Pt has hx of dementia and can be agitated at times.

## 2015-07-12 ENCOUNTER — Inpatient Hospital Stay (HOSPITAL_COMMUNITY): Payer: Medicare Other

## 2015-07-12 DIAGNOSIS — G9340 Encephalopathy, unspecified: Secondary | ICD-10-CM

## 2015-07-12 LAB — GLUCOSE, CAPILLARY
GLUCOSE-CAPILLARY: 199 mg/dL — AB (ref 65–99)
GLUCOSE-CAPILLARY: 173 mg/dL — AB (ref 65–99)
GLUCOSE-CAPILLARY: 168 mg/dL — AB (ref 65–99)
Glucose-Capillary: 219 mg/dL — ABNORMAL HIGH (ref 65–99)

## 2015-07-12 LAB — CBC
HCT: 28.6 % — ABNORMAL LOW (ref 36.0–46.0)
HEMOGLOBIN: 9.2 g/dL — AB (ref 12.0–15.0)
MCH: 29.8 pg (ref 26.0–34.0)
MCHC: 32.2 g/dL (ref 30.0–36.0)
MCV: 92.6 fL (ref 78.0–100.0)
Platelets: 383 10*3/uL (ref 150–400)
RBC: 3.09 MIL/uL — AB (ref 3.87–5.11)
RDW: 15.9 % — AB (ref 11.5–15.5)
WBC: 10.2 10*3/uL (ref 4.0–10.5)

## 2015-07-12 LAB — BASIC METABOLIC PANEL
ANION GAP: 6 (ref 5–15)
BUN: 27 mg/dL — ABNORMAL HIGH (ref 6–20)
CALCIUM: 8.8 mg/dL — AB (ref 8.9–10.3)
CO2: 30 mmol/L (ref 22–32)
Chloride: 102 mmol/L (ref 101–111)
Creatinine, Ser: 0.82 mg/dL (ref 0.44–1.00)
Glucose, Bld: 283 mg/dL — ABNORMAL HIGH (ref 65–99)
POTASSIUM: 3.1 mmol/L — AB (ref 3.5–5.1)
Sodium: 138 mmol/L (ref 135–145)

## 2015-07-12 LAB — MRSA PCR SCREENING: MRSA by PCR: POSITIVE — AB

## 2015-07-12 MED ORDER — INSULIN GLARGINE 100 UNIT/ML ~~LOC~~ SOLN
15.0000 [IU] | Freq: Every day | SUBCUTANEOUS | Status: DC
  Administered 2015-07-12 – 2015-07-15 (×4): 15 [IU] via SUBCUTANEOUS
  Filled 2015-07-12 (×4): qty 0.15

## 2015-07-12 MED ORDER — VANCOMYCIN HCL IN DEXTROSE 750-5 MG/150ML-% IV SOLN
750.0000 mg | Freq: Two times a day (BID) | INTRAVENOUS | Status: DC
  Filled 2015-07-12: qty 150

## 2015-07-12 MED ORDER — INSULIN GLARGINE 100 UNIT/ML ~~LOC~~ SOLN
5.0000 [IU] | Freq: Every day | SUBCUTANEOUS | Status: DC
  Administered 2015-07-12 – 2015-07-13 (×2): 5 [IU] via SUBCUTANEOUS
  Filled 2015-07-12 (×2): qty 0.05

## 2015-07-12 MED ORDER — PANTOPRAZOLE SODIUM 40 MG IV SOLR
40.0000 mg | Freq: Two times a day (BID) | INTRAVENOUS | Status: DC
  Administered 2015-07-12 – 2015-07-15 (×7): 40 mg via INTRAVENOUS
  Filled 2015-07-12 (×7): qty 40

## 2015-07-12 MED ORDER — POLYETHYLENE GLYCOL 3350 17 G PO PACK
17.0000 g | PACK | Freq: Every day | ORAL | Status: DC
  Administered 2015-07-12: 17 g via ORAL
  Filled 2015-07-12: qty 1

## 2015-07-12 MED ORDER — TAMSULOSIN HCL 0.4 MG PO CAPS
0.4000 mg | ORAL_CAPSULE | Freq: Every day | ORAL | Status: DC
  Administered 2015-07-12 – 2015-07-15 (×4): 0.4 mg via ORAL
  Filled 2015-07-12 (×4): qty 1

## 2015-07-12 MED ORDER — OXYCODONE HCL 5 MG PO TABS
5.0000 mg | ORAL_TABLET | Freq: Four times a day (QID) | ORAL | Status: DC | PRN
Start: 2015-07-12 — End: 2015-07-15
  Administered 2015-07-14: 5 mg via ORAL
  Filled 2015-07-12: qty 1

## 2015-07-12 MED ORDER — ACETAMINOPHEN 500 MG PO TABS
500.0000 mg | ORAL_TABLET | Freq: Two times a day (BID) | ORAL | Status: DC
  Administered 2015-07-12 – 2015-07-15 (×6): 500 mg via ORAL
  Filled 2015-07-12 (×7): qty 1

## 2015-07-12 MED ORDER — LEVOFLOXACIN IN D5W 750 MG/150ML IV SOLN
750.0000 mg | INTRAVENOUS | Status: DC
  Administered 2015-07-12 – 2015-07-13 (×2): 750 mg via INTRAVENOUS
  Filled 2015-07-12 (×2): qty 150

## 2015-07-12 NOTE — Care Management Note (Signed)
Case Management Note  Patient Details  Name: Marcia [name] MRN: [id number] Date of Birth: 11/02/1934  Subjective/Objective:    81 yo admitted with acute encephalopathy              Action/Plan: From [facility]  Expected Discharge Date:   Lesia Sago)               Expected Discharge Plan:  Skilled Nursing Facility  In-House Referral:  Clinical Social Work  Discharge planning Services  CM Consult  Post Acute Care Choice:    Choice offered to:     DME Arranged:    DME Agency:     HH Arranged:    [place] Agency:     Status of Service:  Completed, signed off  Medicare Important Message Given:    Date Medicare IM Given:    Medicare IM give by:    Date Additional Medicare IM Given:    Additional Medicare Important Message give by:     If discussed at [place] of Stay Meetings, dates discussed:    Additional CommentsLynnell Catalan, RN 07/12/2015, 10:59 AM  [phone]

## 2015-07-12 NOTE — NC FL2 (Signed)
[place] MEDICAID FL2 LEVEL OF CARE SCREENING TOOL     IDENTIFICATION  Patient Name: Marcia [name] Birthdate: 12/29/1934 Sex: female Admission Date (Current Location): 07/11/2015  [facility] and [place] Number:  Herbalist and Address:  [facility],  [place] [address] [place]      Provider Number: [id number]  Attending Physician Name and Address:  Elmarie Shiley, MD  Relative Name and Phone Number:       Current Level of Care: Hospital Recommended Level of Care: [place] Prior Approval Number:    Date Approved/Denied:   PASRR Number:    Discharge Plan: SNF    Current Diagnoses: Patient Active Problem List   Diagnosis Date Noted  . UTI (lower urinary tract infection) 07/11/2015  . Acute encephalopathy 07/11/2015  . Dementia 07/11/2015  . Diabetes ([place]) 07/11/2015  . Hypokalemia 07/11/2015  . Acute blood loss anemia 07/11/2015  . Hyperglycemia 07/11/2015  . Hydrocephalus 07/11/2015  . NPH (normal pressure hydrocephalus) 07/11/2015  . Rheumatoid arthritis involving both shoulders with positive rheumatoid factor ([place]) 05/23/2015  . Malignant neoplasm of upper lobe of left lung ([place]) 05/23/2015  . Subcortical vascular dementia 05/23/2015  . Malignant neoplasm of upper lobe of right lung ([place]) 04/16/2015  . Type II or unspecified type diabetes mellitus with unspecified complication, uncontrolled 06/14/2012  . Vascular dementia, uncomplicated 13/24/4010  . Cancer of right lung ([place]) 10/08/2011    Orientation RESPIRATION BLADDER Height & Weight     Self, Situation, Place  O2 (2L) Continent Weight: 157 lb (71.215 kg) Height:  '5\' 5"'$  (165.1 cm)  BEHAVIORAL SYMPTOMS/MOOD NEUROLOGICAL BOWEL NUTRITION STATUS      Continent Diet (regular)  AMBULATORY STATUS COMMUNICATION OF NEEDS Skin   Extensive Assist Verbally Surgical wounds                       Personal Care Assistance Level of Assistance  Bathing, Feeding,  Dressing Bathing Assistance: Limited assistance Feeding assistance: Independent Dressing Assistance: Limited assistance     Functional Limitations Info  Sight, Hearing, Speech Sight Info: Adequate Hearing Info: Adequate Speech Info: Adequate    SPECIAL CARE FACTORS FREQUENCY  PT (By licensed PT), OT (By licensed OT)     PT Frequency: 5 OT Frequency: 5            Contractures Contractures Info: Not present    Additional Factors Info  Code Status, Allergies, Insulin Sliding Scale Code Status Info: Full Code Allergies Info: Amoxicillin, Cefaclor, Hydrocodone, Sulfa Antibiotics           Current Medications (07/12/2015):  This is the current hospital active medication list Current Facility-Administered Medications  Medication Dose Route Frequency Provider Last Rate Last Dose  . 0.9 %  sodium chloride infusion   Intravenous Continuous Lily Kocher, MD 100 mL/hr at 07/12/15 0035    . acetaminophen (TYLENOL) tablet 650 mg  650 mg Oral Q6H PRN Lily Kocher, MD       Or  . acetaminophen (TYLENOL) suppository 650 mg  650 mg Rectal Q6H PRN Lily Kocher, MD      . atorvastatin (LIPITOR) tablet 40 mg  40 mg Oral q1800 Lily Kocher, MD   40 mg at 07/12/15 0036  . calcium-vitamin D (OSCAL WITH D) 500-200 MG-UNIT per tablet 1 tablet  1 tablet Oral BID Lily Kocher, MD   1 tablet at 07/12/15 0035  . donepezil (ARICEPT) tablet 23 mg  23 mg Oral QHS Lexine Baton  Eulas Post, MD   23 mg at 07/11/15 2341  . ezetimibe (ZETIA) tablet 10 mg  10 mg Oral Daily Lily Kocher, MD      . insulin aspart (novoLOG) injection 0-15 Units  0-15 Units Subcutaneous TID WC Lily Kocher, MD      . insulin aspart (novoLOG) injection 0-5 Units  0-5 Units Subcutaneous QHS Lily Kocher, MD   3 Units at 07/11/15 2340  . insulin glargine (LANTUS) injection 15 Units  15 Units Subcutaneous Daily Belkys A Regalado, MD      . levofloxacin (LEVAQUIN) IVPB 750 mg  750 mg Intravenous Q24H Lily Kocher, MD      . memantine [facility])  tablet 10 mg  10 mg Oral BID Lily Kocher, MD   10 mg at 07/11/15 2341  . mirabegron ER (MYRBETRIQ) tablet 50 mg  50 mg Oral Daily Lily Kocher, MD      . oxyCODONE (Oxy IR/ROXICODONE) immediate release tablet 5 mg  5 mg Oral Q4H PRN Lily Kocher, MD      . pantoprazole (PROTONIX) injection 40 mg  40 mg Intravenous Q12H Belkys A Regalado, MD      . predniSONE (DELTASONE) tablet 5 mg  5 mg Oral Daily Lily Kocher, MD      . promethazine (PHENERGAN) tablet 12.5 mg  12.5 mg Oral Q6H PRN Lily Kocher, MD      . vancomycin (VANCOCIN) IVPB 750 mg/150 ml premix  750 mg Intravenous Q12H Lily Kocher, MD         Discharge Medications: Please see discharge summary for a list of discharge medications.  Relevant Imaging Results:  Relevant Lab Results:   Additional Information SSN:  [id number]  [place], [place]

## 2015-07-12 NOTE — Evaluation (Signed)
Physical Therapy Evaluation Patient Details Name: Marcia [name] MRN: [id number] DOB: November 07, 1934 Today's Date: 07/12/2015   History of Present Illness  81 yo female admitted with acute encephalopathy. MRI + mod-severe hydrocephalus likely chronic. Recent L hip IM nail 5/14. Hx of dementia, RA, DM, lung ca, osteopenia, neuropathy. Pt is from SNF  Clinical Impression  On eval, pt required Max assist +2 for mobility-performed stand pivot x2 with RW. Total assist for toilet hygiene-bowel incontinence during mobility. Max encouragement for standing tasks. Pt followed 1 step commands well. Limiting factors appear to be pain, weakness. Recommend return to SNF for continued rehab.     Follow Up Recommendations SNF    Equipment Recommendations  None recommended by PT    Recommendations for Other Services       Precautions / Restrictions Precautions Precautions: Fall Restrictions Weight Bearing Restrictions: No Other Position/Activity Restrictions: per "care everywhere" notes pt is WBAT L LE      Mobility  Bed Mobility   Bed Mobility: Supine to Sit;Sit to Supine     Supine to sit: Max assist;+2 for physical assistance;+2 for safety/equipment;HOB elevated Sit to supine: Max assist;+2 for physical assistance;+2 for safety/equipment;HOB elevated   General bed mobility comments: assist for trunk and bil LEs. Utilized bedpad for scooting, positioning. Increased time. Multimodal cues for safety, technique.   Transfers Overall transfer level: Needs assistance Equipment used: Rolling walker (2 wheeled) Transfers: Sit to/from [facility] Sit to Stand: Max assist;+2 physical assistance;+2 safety/equipment;From elevated surface Stand pivot transfers: Max assist;+2 physical assistance;+2 safety/equipment       General transfer comment: Assist to rise, stabilize, weight shift, control descent. Multimodal cues for safety, technique, hand/LE placement. Stand pivot x 2, bed <>bsc,  with RW. Increased time. Max encouragement for pt to put forth best effort.   Ambulation/Gait             General Gait Details: NT-pt unable  Stairs            Wheelchair Mobility    Modified Rankin (Stroke Patients Only)       Balance Overall balance assessment: Needs assistance;History of Falls         Standing balance support: Bilateral upper extremity supported;During functional activity Standing balance-Leahy Scale: Poor Standing balance comment: requires RW                             Pertinent Vitals/Pain Pain Assessment: Faces Faces Pain Scale: Hurts whole lot Pain Location: L LE with all activity Pain Descriptors / Indicators: Sore;Grimacing Pain Intervention(s): Limited activity within patient's tolerance;Repositioned    Home Living Family/patient expects to be discharged to:: Skilled nursing facility                      Prior Function Level of Independence: Needs assistance               Hand Dominance        Extremity/Trunk Assessment   Upper Extremity Assessment: Generalized weakness           Lower Extremity Assessment: Generalized weakness      Cervical / Trunk Assessment: Kyphotic  Communication   Communication: No difficulties  Cognition Arousal/Alertness: Awake/alert Behavior During Therapy: WFL for tasks assessed/performed Overall Cognitive Status: History of cognitive impairments - at baseline                      General  Comments      Exercises        Assessment/Plan    PT Assessment Patient needs continued PT services  PT Diagnosis Difficulty walking;Generalized weakness;Acute pain   PT Problem List Decreased strength;Decreased range of motion;Decreased activity tolerance;Decreased balance;Decreased mobility;Decreased knowledge of use of DME;Decreased cognition;Pain  PT Treatment Interventions DME instruction;Gait training;Functional mobility training;Therapeutic  activities;Patient/family education;Balance training;Therapeutic exercise   PT Goals (Current goals can be found in the Care Plan section) Acute Rehab PT Goals Patient Stated Goal: none stated. per husband, back to rehab to continue therapy PT Goal Formulation: With family Time For Goal Achievement: 07/26/15 Potential to Achieve Goals: Fair    Frequency Min 3X/week   Barriers to discharge        Co-evaluation               End of Session Equipment Utilized During Treatment: Gait belt Activity Tolerance: Patient limited by fatigue;Patient limited by pain Patient left: in bed;with call bell/phone within reach;with bed alarm set;with family/visitor present           Time: 9163-8466 PT Time Calculation (min) (ACUTE ONLY): 26 min   Charges:   PT Evaluation $PT Eval Low Complexity: 1 Procedure PT Treatments $Therapeutic Activity: 8-22 mins   PT G Codes:        Weston Anna, MPT Pager: [phone]

## 2015-07-12 NOTE — Progress Notes (Signed)
Pharmacy Antibiotic Note  Marcia [name] is a 81 y.o. female admitted on 07/11/2015 with pneumonia and UTI.  Pharmacy has been consulted for Vancomycin dosing.  Plan: Vancomycin '750mg'$  IV every 12 hours.  Goal trough 15-20 mcg/mL.  Height: '5\' 5"'$  (165.1 cm) Weight: 157 lb (71.215 kg) IBW/kg (Calculated) : 57  Temp (24hrs), Avg:98.7 F (37.1 C), Min:98.5 F (36.9 C), Max:99 F (37.2 C)   Recent Labs Lab 07/11/15 1519 07/11/15 1824 07/12/15 0333  WBC 12.5*  --  10.2  CREATININE 1.05*  --  0.82  LATICACIDVEN  --  1.23  --     Estimated Creatinine Clearance: 56 mL/min (by C-G formula based on Cr of 0.82).    Allergies  Allergen Reactions  . Amoxicillin Nausea And Vomiting  . Cefaclor Nausea And Vomiting  . Hydrocodone Nausea And Vomiting  . Sulfa Antibiotics     " legs give out"    Antimicrobials this admission: Vancomycin 5/25 >> Levofloxacin 5/25 >>  Dose adjustments this admission: -  Microbiology results: Pending  Thank you for allowing pharmacy to be a part of this patient's care.  Nani Skillern Crowford 07/12/2015 5:35 AM

## 2015-07-12 NOTE — Progress Notes (Signed)
LCSW following as patient is admitted from [facility]. Patient has been in SNF for last 2 weeks post fall. Spoke with [facility] with regards to patient return once medically stable, patient was not a bed hold, thus if there is not a bed at discharge, patient will have to choose elsewhere to complete rehab or go home.  FL2 updated for patient if returning to SNF. Clinicals sent to [facility] as plan remains to return, but will have to contact [facility] at DC for bed status.   Discussed plan with family:   Husband/ Marcia [name] [phone]    Plan will be for patient to return to [facility] upon discharge. Husband aware of risk of losing bed, but hopeful for quick discharge once everything has been ruled out.  LCSW will continue to follow and assist with disposition/planning.  Lane Hacker, MSW Clinical Social Work: [employer] [phone]

## 2015-07-12 NOTE — Progress Notes (Addendum)
PROGRESS NOTE    Marcia [name]  IPJ:[id number] DOB: 07-Aug-1934 DOA: 07/11/2015 PCP: Reginia Naas, MD   Brief Narrative: Marcia [name] is a 81 y.o. woman with a history of dementia, RA, DM, dyslipidemia, and lung cancer who is S/P surgical intervention for left intertrochanteric hip fracture at [facility] on 5/13. She was discharged to SNF on 5/19. Of note, she was discharged on supplement oxygen, which is new for her (no coinciding diagnosis per family). The patient is obtunded and unable to give any clinical history. Her husband reports that she has acute mental status changes earlier today. She did not eat well with breakfast, and subsequently developed nausea and vomiting. She was found to have abdominal distention. No fever. No indication of chest pain or shortness of breath. No cough. The patient was found to have acute urinary retention (greater than 1L of urine in her bladder). U/A shows mild leukocytes but TNTC WBC; nitrite negative. Chest xray interpreted as possible LLL infiltrate. She has a mildy elevated WBC count to 12.5 but no fever. Hospitalist asked to admit for acute encephalopathy attributed to UTI.    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 MRI finding chronic. Discussed case with neurology, because patient is back to baseline no need to proceed with LP. Patient to Follow up with Primary neurology.  Patient back to baseline per husband.   2-NPH, chronic.  Discussed case with neurology, because patient is back to baseline no need to proceed with LP. Patient to Follow up with Primary  neurology.   3-UTI; continue with Levaquin. Follow urine culture.   4-Urine retention;  Has foley catheter.  Treat for UTI. Start Flomax.  Treat constipation.   5-DM; continue with lantus 15 units AM and 5 units at bedtime. SSI.    6-Anemia; follow hb trend. Started protonix. Check for occult blood.     DVT prophylaxis:lovenox Code Status: full code.  Family Communication: husband at bedside.  Disposition Plan: Back to SNF when culture available   Consultants:   Dr Tobias Alexander over phone/   Procedures:  none  Antimicrobials:   Levaquin 5-25   Subjective: Patient is alert this am, was eating breakfast. She was able to tell me her name, that she was at the hospital. She has 3 children.   Objective: Filed Vitals:   07/11/15 1718 07/11/15 2148 07/11/15 2317 07/12/15 0646  BP: 143/40 152/51 142/42 130/44  Pulse: 87 86 88 89  Temp: 98.6 F (37 C) 98.5 F (36.9 C) 99 F (37.2 C) 99.2 F (37.3 C)  TempSrc: Oral Oral Oral Oral  Resp: '18 18 18 18  '$ Height:   '5\' 5"'$  (1.651 m)   Weight:   71.215 kg (157 lb)   SpO2: 97% 100% 99% 98%    Intake/Output Summary (Last 24 hours) at 07/12/15 0902 Last data filed at 07/12/15 0647  Gross per 24 hour  Intake    690 ml  Output   1650 ml  Net   -960 ml   Filed Weights   07/11/15 2317  Weight: 71.215 kg (157 lb)    Examination:  General exam:  Appears calm and comfortable  Respiratory system: Clear to auscultation. Respiratory effort normal. Cardiovascular system: S1 & S2 heard, RRR. No JVD, murmurs, rubs, gallops or clicks. No pedal edema. Gastrointestinal system: Abdomen is nondistended, soft and nontender. No organomegaly or masses felt. Normal bowel sounds heard. Central nervous system: Alert and oriented times 2. No focal neurological deficits.  Extremities: Symmetric 5 x 5 power. Skin: No rashes, lesions or ulcers    Data Reviewed: I have personally reviewed following labs and imaging studies  CBC:  Recent  Labs Lab 07/11/15 1519 07/12/15 0333  WBC 12.5* 10.2  NEUTROABS 10.8*  --   HGB 10.4* 9.2*  HCT 31.5* 28.6*  MCV 89.2 92.6  PLT 437* 063   Basic Metabolic Panel:  Recent Labs Lab 07/11/15 1519 07/12/15 0333  NA 139 138  K 3.3* 3.1*  CL 98* 102  CO2 32 30  GLUCOSE 341* 283*  BUN 36* 27*  CREATININE 1.05* 0.82  CALCIUM 9.3 8.8*   GFR: Estimated Creatinine Clearance: 56 mL/min (by C-G formula based on Cr of 0.82). Liver Function Tests:  Recent Labs Lab 07/11/15 1519  AST 21  ALT 19  ALKPHOS 94  BILITOT 1.3*  PROT 6.0*  ALBUMIN 2.9*   No results for input(s): LIPASE, AMYLASE in the last 168 hours. No results for input(s): AMMONIA in the last 168 hours. Coagulation Profile:  Recent Labs Lab 07/11/15 1519  INR 1.11   Cardiac Enzymes:  Recent Labs Lab 07/11/15 1519  TROPONINI 0.03   BNP (last 3 results) No results for input(s): PROBNP in the last 8760 hours. HbA1C: No results for input(s): HGBA1C in the last 72 hours. CBG:  Recent Labs Lab 07/11/15 1509 07/11/15 2326 07/12/15 0746  GLUCAP 335* 281* 219*   Lipid Profile: No results for input(s): CHOL, HDL, LDLCALC, TRIG, CHOLHDL, LDLDIRECT in the last 72 hours. Thyroid Function Tests: No results for input(s): TSH, T4TOTAL, FREET4, T3FREE, THYROIDAB in the last 72 hours. Anemia Panel: No results for input(s): VITAMINB12, FOLATE, FERRITIN, TIBC, IRON, RETICCTPCT in the last 72 hours. Sepsis Labs:  Recent Labs Lab 07/11/15 1824  LATICACIDVEN 1.23    No results found for this or any previous visit (from the past 240 hour(s)).       Radiology Studies: X-ray Chest Pa And Lateral  07/12/2015  CLINICAL DATA:  Shortness of breath.  Pain. EXAM: CHEST  2 VIEW COMPARISON:  07/11/2015. FINDINGS: The heart size is mildly enlarged. There is no pleural effusion or interstitial edema. Volume loss, scarring and architectural distortion is identified within the right upper lobe compatible with  postoperative change. No superimposed airspace opacities. IMPRESSION: 1. No acute cardiopulmonary abnormalities. 2. Postoperative change involving the right upper lung is similar to previous exam. Electronically Signed   By: Kerby Moors M.D.   On: 07/12/2015 08:36   Dg Chest 2 View  07/11/2015  CLINICAL DATA:  Lethargy and lack of appetite. Dementia. History of lung cancer post surgery. EXAM: CHEST  2 VIEW COMPARISON:  11/30/2007 and CT 04/09/2015 FINDINGS: Lungs are adequately inflated and demonstrate stable postsurgical changes over the right perihilar region and upper lung. Suggestion mild opacification of the posterior left base/retrocardiac region. Focal pleural fluid collection posteriorly of the right upper thorax unchanged from the recent CT. Cardiomediastinal silhouette is within normal. Remainder of the exam is unchanged. IMPRESSION: Mild opacification in the left base/retrocardiac region which may represent atelectasis or infection. Stable postsurgical changes of the right perihilar region and upper lung. Electronically Signed  By: Marin Olp M.D.   On: 07/11/2015 16:01   Ct Head Wo Contrast  07/11/2015  CLINICAL DATA:  Lethargy today, dementia, fell last week EXAM: CT HEAD WITHOUT CONTRAST TECHNIQUE: Contiguous axial images were obtained from the base of the skull through the vertex without intravenous contrast. COMPARISON:  03/21/2006 FINDINGS: There is diffuse cortical atrophy with low attenuation in the deep white matter. These findings were present previously but have progressed. There is prominence of the lateral ventricle. Portion allergy to atrophy is difficult to determine, but the temporal horns are prominent. Third ventricle is also prominent as is the fourth. There is no evidence of infarct or mass. There is no hemorrhage or extra-axial fluid. The calvarium is intact. IMPRESSION: Age-related involutional change. Additionally, the ventricles are prominent and proportionality to  atrophy is difficult to determine. In particular the temporal horns are prominent which raises concern for hydrocephalus. Third and fourth ventricles also appear prominent and the possibility of communicating hydrocephalus is not excluded. Electronically Signed   By: Skipper Cliche M.D.   On: 07/11/2015 15:55   Mr Jeri Cos NU Contrast  07/11/2015  CLINICAL DATA:  Lethargy, decreased appetite for 1 day. Hyperglycemia. History of dementia. EXAM: MRI HEAD WITHOUT AND WITH CONTRAST TECHNIQUE: Multiplanar, multiecho pulse sequences of the brain and surrounding structures were obtained without and with intravenous contrast. CONTRAST:  45m MULTIHANCE GADOBENATE DIMEGLUMINE 529 MG/ML IV SOLN COMPARISON:  CT HEAD Jul 11, 2015 at 1540 hours and MRI head March 22, 2006 FINDINGS: INTRACRANIAL CONTENTS: No reduced diffusion to suggest acute ischemia. No susceptibility artifact to suggest hemorrhage. Moderate to severe ventriculomegaly with disproportionate sulcal effacement at the convexities. Patchy to confluent supratentorial white matter FLAIR T2 hyperintensities. 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 No midline shift, mass effect, mass lesions nor abnormal intraparenchymal enhancement the post gadolinium sequences are moderately motion degraded. Major intracranial vascular flow voids present at skull base. No abnormal extra-axial fluid collections or abnormal enhancement though motion degrades sensitivity. ORBITS: The included ocular globes and orbital contents are non-suspicious. Status post bilateral ocular lens implants. SINUSES: Paranasal sinuses are well-aerated. Hypoplastic frontal sinuses. Trace RIGHT mastoid effusion. SKULL/SOFT TISSUES: No abnormal sellar expansion. No suspicious calvarial bone marrow signal. Craniocervical junction maintained. IMPRESSION: Moderate to severe hydrocephalus, likely on a chronic basis (normal pressure hydrocephalus). Moderate to severe white matter changes most commonly seen  with chronic small vessel ischemic disease, there could be a component interstitial edema. Old small cerebellar infarcts. Electronically Signed   By: CElon AlasM.D.   On: 07/11/2015 22:17        Scheduled Meds: . atorvastatin  40 mg Oral q1800  . calcium-vitamin D  1 tablet Oral BID  . donepezil  23 mg Oral QHS  . ezetimibe  10 mg Oral Daily  . insulin aspart  0-15 Units Subcutaneous TID WC  . insulin aspart  0-5 Units Subcutaneous QHS  . insulin glargine  15 Units Subcutaneous Daily  . levofloxacin (LEVAQUIN) IV  750 mg Intravenous Q24H  . memantine  10 mg Oral BID  . mirabegron ER  50 mg Oral Daily  . pantoprazole (PROTONIX) IV  40 mg Intravenous Q12H  . predniSONE  5 mg Oral Daily  . vancomycin  750 mg Intravenous Q12H   Continuous Infusions: . sodium chloride 100 mL/hr at 07/12/15 0035     LOS: 1 day    Time spent: 35 minutes.     RElmarie Shiley MD [facility] Pager [phone] If 7PM-7AM,  please contact night-coverage [email or web address] Password [email or web address] 07/12/2015, 9:02 AM

## 2015-07-12 NOTE — Progress Notes (Addendum)
Pt hgb down slightly this morning from yesterday, 9.2 from 10.4, pts last two lab results for last two years have been 14. However, pt did have hip surgery 2 weeks ago. Pt had coffee ground looking substance on her hands when she arrived from ED, she did not remember what happened.  Fara Olden P

## 2015-07-13 LAB — CBC
HCT: 27.4 % — ABNORMAL LOW (ref 36.0–46.0)
Hemoglobin: 8.7 g/dL — ABNORMAL LOW (ref 12.0–15.0)
MCH: 29.5 pg (ref 26.0–34.0)
MCHC: 31.8 g/dL (ref 30.0–36.0)
MCV: 92.9 fL (ref 78.0–100.0)
PLATELETS: 339 10*3/uL (ref 150–400)
RBC: 2.95 MIL/uL — ABNORMAL LOW (ref 3.87–5.11)
RDW: 15.9 % — AB (ref 11.5–15.5)
WBC: 6.5 10*3/uL (ref 4.0–10.5)

## 2015-07-13 LAB — GLUCOSE, CAPILLARY
GLUCOSE-CAPILLARY: 219 mg/dL — AB (ref 65–99)
Glucose-Capillary: 168 mg/dL — ABNORMAL HIGH (ref 65–99)
Glucose-Capillary: 213 mg/dL — ABNORMAL HIGH (ref 65–99)
Glucose-Capillary: 238 mg/dL — ABNORMAL HIGH (ref 65–99)

## 2015-07-13 LAB — BASIC METABOLIC PANEL
ANION GAP: 6 (ref 5–15)
BUN: 18 mg/dL (ref 6–20)
CALCIUM: 8.9 mg/dL (ref 8.9–10.3)
CO2: 31 mmol/L (ref 22–32)
Chloride: 102 mmol/L (ref 101–111)
Creatinine, Ser: 0.62 mg/dL (ref 0.44–1.00)
GFR calc Af Amer: >60 mL/min (ref >60)
GLUCOSE: 175 mg/dL — AB (ref 65–99)
Potassium: 3 mmol/L — ABNORMAL LOW (ref 3.5–5.1)
SODIUM: 139 mmol/L (ref 135–145)

## 2015-07-13 LAB — HEMOGLOBIN A1C
HEMOGLOBIN A1C: 10.6 % — AB (ref 4.8–5.6)
MEAN PLASMA GLUCOSE: 258 mg/dL

## 2015-07-13 LAB — OCCULT BLOOD X 1 CARD TO LAB, STOOL: FECAL OCCULT BLD: NEGATIVE

## 2015-07-13 MED ORDER — MUPIROCIN 2 % EX OINT
1.0000 "application " | TOPICAL_OINTMENT | Freq: Two times a day (BID) | CUTANEOUS | Status: DC
  Administered 2015-07-13 – 2015-07-15 (×5): 1 via NASAL
  Filled 2015-07-13 (×2): qty 22

## 2015-07-13 MED ORDER — GLUCERNA SHAKE PO LIQD
237.0000 mL | Freq: Two times a day (BID) | ORAL | Status: DC
  Administered 2015-07-13 – 2015-07-15 (×5): 237 mL via ORAL
  Filled 2015-07-13 (×6): qty 237

## 2015-07-13 MED ORDER — FERROUS SULFATE 325 (65 FE) MG PO TABS
325.0000 mg | ORAL_TABLET | Freq: Two times a day (BID) | ORAL | Status: DC
  Administered 2015-07-13 – 2015-07-15 (×5): 325 mg via ORAL
  Filled 2015-07-13 (×5): qty 1

## 2015-07-13 MED ORDER — CHLORHEXIDINE GLUCONATE CLOTH 2 % EX PADS
6.0000 | MEDICATED_PAD | Freq: Every day | CUTANEOUS | Status: DC
  Administered 2015-07-13 – 2015-07-15 (×3): 6 via TOPICAL

## 2015-07-13 MED ORDER — POLYETHYLENE GLYCOL 3350 17 G PO PACK
17.0000 g | PACK | Freq: Two times a day (BID) | ORAL | Status: DC
  Administered 2015-07-13 – 2015-07-15 (×4): 17 g via ORAL
  Filled 2015-07-13 (×5): qty 1

## 2015-07-13 MED ORDER — POTASSIUM CHLORIDE CRYS ER 20 MEQ PO TBCR
40.0000 meq | EXTENDED_RELEASE_TABLET | Freq: Two times a day (BID) | ORAL | Status: AC
  Administered 2015-07-13 (×2): 40 meq via ORAL
  Filled 2015-07-13 (×2): qty 2

## 2015-07-13 MED ORDER — BISACODYL 10 MG RE SUPP
10.0000 mg | Freq: Every day | RECTAL | Status: DC | PRN
  Administered 2015-07-13: 10 mg via RECTAL
  Filled 2015-07-13: qty 1

## 2015-07-13 NOTE — Progress Notes (Signed)
PROGRESS NOTE    Marcia [name]  TKW:[id number] DOB: 1934/08/15 DOA: 07/11/2015 PCP: Reginia Naas, MD   Brief Narrative: Marcia [name] is a 81 y.o. woman with a history of dementia, RA, DM, dyslipidemia, and lung cancer who is S/P surgical intervention for left intertrochanteric hip fracture at [facility] on 5/13. She was discharged to SNF on 5/19. Of note, she was discharged on supplement oxygen, which is new for her (no coinciding diagnosis per family). The patient is obtunded and unable to give any clinical history. Her husband reports that she has acute mental status changes earlier today. She did not eat well with breakfast, and subsequently developed nausea and vomiting. She was found to have abdominal distention. No fever. No indication of chest pain or shortness of breath. No cough. The patient was found to have acute urinary retention (greater than 1L of urine in her bladder). U/A shows mild leukocytes but TNTC WBC; nitrite negative. Chest xray interpreted as possible LLL infiltrate. She has a mildy elevated WBC count to 12.5 but no fever. Hospitalist asked to admit for acute encephalopathy attributed to UTI.    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 MRI findings are chronic. Discussed case with neurology, because patient is back to baseline no need to proceed with LP. Patient to Follow up with Primary neurology.  Patient back to baseline per husband.  Continue with treatment of UTI.   2-NPH, chronic.  Discussed case with neurology, because patient is back to baseline no need to proceed  with LP. Patient to Follow up with Primary neurology.   3-UTI; continue with Levaquin. Follow urine culture.  Blood culture no growth to date.   4-Urine retention;  Has foley catheter.  Treat for UTI. Started Flomax.  Treat constipation.  Try voiding trial today.   5-DM; continue with lantus 15 units AM and 5 units at bedtime. SSI.   Follow CBG.  Poor oral intake, will order Glucerna.   6-Anemia; follow hb trend. Started protonix. Check for occult blood.  Will request records from facility. Start ferrous sulfate.  Repeat Hb in am.   7-Hypokalemia; 40 meq times 2..     DVT prophylaxis:lovenox Code Status: full code.  Family Communication: husband at bedside.  Disposition Plan: Back to SNF when culture available and stable 1 or 2 days.    Consultants:   Dr Tobias Alexander over phone/   Procedures:  none  Antimicrobials:   Levaquin 5-25   Subjective: Patient is alert, confuse at baseline. Didn't want to eat breakfast.    Objective: Filed Vitals:   07/12/15 0646 07/12/15 1400 07/12/15 2135 07/13/15 0530  BP: 130/44 143/48 140/57 136/47  Pulse: 89 80 83 79  Temp: 99.2 F (37.3 C) 98.7 F (37.1 C) 99 F (37.2 C) 98.6 F (37 C)  TempSrc: Oral Oral Oral Oral  Resp: '18 16 16 18  '$ Height:      Weight:      SpO2: 98% 100% 98% 94%    Intake/Output Summary (Last 24 hours) at 07/13/15 1110 Last data filed at 07/13/15 1000  Gross per 24 hour  Intake  1560 ml  Output   1350 ml  Net    210 ml   Filed Weights   07/11/15 2317  Weight: 71.215 kg (157 lb)    Examination:  General exam: Appears calm and comfortable  Respiratory system: Clear to auscultation. Respiratory effort normal. Cardiovascular system: S1 & S2 heard, RRR. No JVD, murmurs, rubs, gallops or clicks. No pedal edema. Gastrointestinal system: Abdomen is nondistended, soft and nontender. No organomegaly or masses felt. Normal bowel sounds heard. Central nervous system: Alert and oriented times 2.  No focal neurological deficits.  Extremities: Symmetric 5 x 5 power. Skin: No rashes, lesions or ulcers    Data Reviewed: I have personally reviewed following labs and imaging studies  CBC:  Recent Labs Lab 07/11/15 1519 07/12/15 0333 07/13/15 0346  WBC 12.5* 10.2 6.5  NEUTROABS 10.8*  --   --   HGB 10.4* 9.2* 8.7*  HCT 31.5* 28.6* 27.4*  MCV 89.2 92.6 92.9  PLT 437* 383 094   Basic Metabolic Panel:  Recent Labs Lab 07/11/15 1519 07/12/15 0333 07/13/15 0346  NA 139 138 139  K 3.3* 3.1* 3.0*  CL 98* 102 102  CO2 32 30 31  GLUCOSE 341* 283* 175*  BUN 36* 27* 18  CREATININE 1.05* 0.82 0.62  CALCIUM 9.3 8.8* 8.9   GFR: Estimated Creatinine Clearance: 57.4 mL/min (by C-G formula based on Cr of 0.62). Liver Function Tests:  Recent Labs Lab 07/11/15 1519  AST 21  ALT 19  ALKPHOS 94  BILITOT 1.3*  PROT 6.0*  ALBUMIN 2.9*   No results for input(s): LIPASE, AMYLASE in the last 168 hours. No results for input(s): AMMONIA in the last 168 hours. Coagulation Profile:  Recent Labs Lab 07/11/15 1519  INR 1.11   Cardiac Enzymes:  Recent Labs Lab 07/11/15 1519  TROPONINI 0.03   BNP (last 3 results) No results for input(s): PROBNP in the last 8760 hours. HbA1C:  Recent Labs  07/12/15 0333  HGBA1C 10.6*   CBG:  Recent Labs Lab 07/12/15 0746 07/12/15 1205 07/12/15 1725 07/12/15 2204 07/13/15 0747  GLUCAP 219* 199* 173* 168* 168*   Lipid Profile: No results for input(s): CHOL, HDL, LDLCALC, TRIG, CHOLHDL, LDLDIRECT in the last 72 hours. Thyroid Function Tests: No results for input(s): TSH, T4TOTAL, FREET4, T3FREE, THYROIDAB in the last 72 hours. Anemia Panel: No results for input(s): VITAMINB12, FOLATE, FERRITIN, TIBC, IRON, RETICCTPCT in the last 72 hours. Sepsis Labs:  Recent Labs Lab 07/11/15 1824  LATICACIDVEN 1.23    Recent Results (from the past 240 hour(s))  Urine culture     Status: None (Preliminary result)   Collection Time:  07/11/15  4:54 PM  Result Value Ref Range Status   Specimen Description URINE, CATHETERIZED  Final   Special Requests Normal  Final   Culture   Final    CULTURE REINCUBATED FOR BETTER GROWTH Performed at [facility]    Report Status PENDING  Incomplete  Blood culture (routine x 2)     Status: None (Preliminary result)   Collection Time: 07/11/15  6:15 PM  Result Value Ref Range Status   Specimen Description BLOOD LEFT WRIST  Final   Special Requests BOTTLES DRAWN AEROBIC AND ANAEROBIC 5 ML  Final   Culture   Final    NO GROWTH 1 DAY Performed at [facility]    Report Status PENDING  Incomplete  Blood culture (routine x 2)     Status: None (Preliminary result)   Collection Time:  07/12/15  3:33 AM  Result Value Ref Range Status   Specimen Description BLOOD RIGHT HAND  Final   Special Requests IN PEDIATRIC BOTTLE 2ML  Final   Culture   Final    NO GROWTH 1 DAY Performed at [facility]    Report Status PENDING  Incomplete  MRSA PCR Screening     Status: Abnormal   Collection Time: 07/12/15  5:30 AM  Result Value Ref Range Status   MRSA by PCR POSITIVE (A) NEGATIVE Final    Comment: RESULT CALLED TO, READ BACK BY AND VERIFIED WITH: HERNDON,F. RN '@1022'$  ON 5.26.17 BY MCCOY,N.        The GeneXpert MRSA Assay (FDA approved for NASAL specimens only), is one component of a comprehensive MRSA colonization surveillance program. It is not intended to diagnose MRSA infection nor to guide or monitor treatment for MRSA infections.          Radiology Studies: X-ray Chest Pa And Lateral  07/12/2015  CLINICAL DATA:  Shortness of breath.  Pain. EXAM: CHEST  2 VIEW COMPARISON:  07/11/2015. FINDINGS: The heart size is mildly enlarged. There is no pleural effusion or interstitial edema. Volume loss, scarring and architectural distortion is identified within the right upper lobe compatible with postoperative change. No superimposed airspace opacities. IMPRESSION:  1. No acute cardiopulmonary abnormalities. 2. Postoperative change involving the right upper lung is similar to previous exam. Electronically Signed   By: Kerby Moors M.D.   On: 07/12/2015 08:36   Dg Chest 2 View  07/11/2015  CLINICAL DATA:  Lethargy and lack of appetite. Dementia. History of lung cancer post surgery. EXAM: CHEST  2 VIEW COMPARISON:  11/30/2007 and CT 04/09/2015 FINDINGS: Lungs are adequately inflated and demonstrate stable postsurgical changes over the right perihilar region and upper lung. Suggestion mild opacification of the posterior left base/retrocardiac region. Focal pleural fluid collection posteriorly of the right upper thorax unchanged from the recent CT. Cardiomediastinal silhouette is within normal. Remainder of the exam is unchanged. IMPRESSION: Mild opacification in the left base/retrocardiac region which may represent atelectasis or infection. Stable postsurgical changes of the right perihilar region and upper lung. Electronically Signed   By: Marin Olp M.D.   On: 07/11/2015 16:01   Ct Head Wo Contrast  07/11/2015  CLINICAL DATA:  Lethargy today, dementia, fell last week EXAM: CT HEAD WITHOUT CONTRAST TECHNIQUE: Contiguous axial images were obtained from the base of the skull through the vertex without intravenous contrast. COMPARISON:  03/21/2006 FINDINGS: There is diffuse cortical atrophy with low attenuation in the deep white matter. These findings were present previously but have progressed. There is prominence of the lateral ventricle. Portion allergy to atrophy is difficult to determine, but the temporal horns are prominent. Third ventricle is also prominent as is the fourth. There is no evidence of infarct or mass. There is no hemorrhage or extra-axial fluid. The calvarium is intact. IMPRESSION: Age-related involutional change. Additionally, the ventricles are prominent and proportionality to atrophy is difficult to determine. In particular the temporal horns are  prominent which raises concern for hydrocephalus. Third and fourth ventricles also appear prominent and the possibility of communicating hydrocephalus is not excluded. Electronically Signed   By: Skipper Cliche M.D.   On: 07/11/2015 15:55   Mr Jeri Cos TD Contrast  07/11/2015  CLINICAL DATA:  Lethargy, decreased appetite for 1 day. Hyperglycemia. History of dementia. EXAM: MRI HEAD WITHOUT AND WITH CONTRAST TECHNIQUE: Multiplanar, multiecho pulse sequences of the brain and surrounding structures  were obtained without and with intravenous contrast. CONTRAST:  70m MULTIHANCE GADOBENATE DIMEGLUMINE 529 MG/ML IV SOLN COMPARISON:  CT HEAD Jul 11, 2015 at 1540 hours and MRI head March 22, 2006 FINDINGS: INTRACRANIAL CONTENTS: No reduced diffusion to suggest acute ischemia. No susceptibility artifact to suggest hemorrhage. Moderate to severe ventriculomegaly with disproportionate sulcal effacement at the convexities. Patchy to confluent supratentorial white matter FLAIR T2 hyperintensities. 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 No midline shift, mass effect, mass lesions nor abnormal intraparenchymal enhancement the post gadolinium sequences are moderately motion degraded. Major intracranial vascular flow voids present at skull base. No abnormal extra-axial fluid collections or abnormal enhancement though motion degrades sensitivity. ORBITS: The included ocular globes and orbital contents are non-suspicious. Status post bilateral ocular lens implants. SINUSES: Paranasal sinuses are well-aerated. Hypoplastic frontal sinuses. Trace RIGHT mastoid effusion. SKULL/SOFT TISSUES: No abnormal sellar expansion. No suspicious calvarial bone marrow signal. Craniocervical junction maintained. IMPRESSION: Moderate to severe hydrocephalus, likely on a chronic basis (normal pressure hydrocephalus). Moderate to severe white matter changes most commonly seen with chronic small vessel ischemic disease, there could be a component  interstitial edema. Old small cerebellar infarcts. Electronically Signed   By: CElon AlasM.D.   On: 07/11/2015 22:17        Scheduled Meds: . acetaminophen  500 mg Oral BID  . atorvastatin  40 mg Oral q1800  . calcium-vitamin D  1 tablet Oral BID  . donepezil  23 mg Oral QHS  . ezetimibe  10 mg Oral Daily  . ferrous sulfate  325 mg Oral BID WC  . insulin aspart  0-15 Units Subcutaneous TID WC  . insulin aspart  0-5 Units Subcutaneous QHS  . insulin glargine  15 Units Subcutaneous Daily  . insulin glargine  5 Units Subcutaneous QHS  . levofloxacin (LEVAQUIN) IV  750 mg Intravenous Q24H  . memantine  10 mg Oral BID  . mirabegron ER  50 mg Oral Daily  . pantoprazole (PROTONIX) IV  40 mg Intravenous Q12H  . polyethylene glycol  17 g Oral BID  . potassium chloride  40 mEq Oral BID  . predniSONE  5 mg Oral Daily  . tamsulosin  0.4 mg Oral Daily   Continuous Infusions: . sodium chloride 1,000 mL (07/13/15 0645)     LOS: 2 days    Time spent: 35 minutes.     RElmarie Shiley MD [facility] Pager [phone] If 7PM-7AM, please contact night-coverage [email or web address] Password [email or web address] 07/13/2015, 11:10 AM

## 2015-07-14 DIAGNOSIS — N390 Urinary tract infection, site not specified: Principal | ICD-10-CM

## 2015-07-14 LAB — BASIC METABOLIC PANEL WITH GFR
Anion gap: 6 (ref 5–15)
BUN: 9 mg/dL (ref 6–20)
CO2: 29 mmol/L (ref 22–32)
Calcium: 8.4 mg/dL — ABNORMAL LOW (ref 8.9–10.3)
Chloride: 99 mmol/L — ABNORMAL LOW (ref 101–111)
Creatinine, Ser: 0.53 mg/dL (ref 0.44–1.00)
GFR calc Af Amer: >60 mL/min
GFR calc non Af Amer: >60 mL/min
Glucose, Bld: 275 mg/dL — ABNORMAL HIGH (ref 65–99)
Potassium: 3.9 mmol/L (ref 3.5–5.1)
Sodium: 134 mmol/L — ABNORMAL LOW (ref 135–145)

## 2015-07-14 LAB — OCCULT BLOOD X 1 CARD TO LAB, STOOL: Fecal Occult Bld: NEGATIVE

## 2015-07-14 LAB — GLUCOSE, CAPILLARY
GLUCOSE-CAPILLARY: 276 mg/dL — AB (ref 65–99)
Glucose-Capillary: 273 mg/dL — ABNORMAL HIGH (ref 65–99)
Glucose-Capillary: 245 mg/dL — ABNORMAL HIGH (ref 65–99)
Glucose-Capillary: 158 mg/dL — ABNORMAL HIGH (ref 65–99)

## 2015-07-14 LAB — CBC
HCT: 30.6 % — ABNORMAL LOW (ref 36.0–46.0)
Hemoglobin: 9.8 g/dL — ABNORMAL LOW (ref 12.0–15.0)
MCH: 29.3 pg (ref 26.0–34.0)
MCHC: 32 g/dL (ref 30.0–36.0)
MCV: 91.6 fL (ref 78.0–100.0)
PLATELETS: 346 10*3/uL (ref 150–400)
RBC: 3.34 MIL/uL — ABNORMAL LOW (ref 3.87–5.11)
RDW: 15.5 % (ref 11.5–15.5)
WBC: 6.6 10*3/uL (ref 4.0–10.5)

## 2015-07-14 MED ORDER — HYDRALAZINE HCL 20 MG/ML IJ SOLN: 10.0000 mg | Freq: Four times a day (QID) | INTRAMUSCULAR | Status: DC | PRN

## 2015-07-14 MED ORDER — AMLODIPINE BESYLATE 5 MG PO TABS
5.0000 mg | ORAL_TABLET | Freq: Every day | ORAL | Status: DC
  Administered 2015-07-14: 5 mg via ORAL
  Filled 2015-07-14: qty 1

## 2015-07-14 MED ORDER — INSULIN GLARGINE 100 UNIT/ML ~~LOC~~ SOLN
10.0000 [IU] | Freq: Every day | SUBCUTANEOUS | Status: DC
  Administered 2015-07-14: 10 [IU] via SUBCUTANEOUS
  Filled 2015-07-14 (×2): qty 0.1

## 2015-07-14 MED ORDER — LEVOFLOXACIN 750 MG PO TABS
750.0000 mg | ORAL_TABLET | Freq: Every day | ORAL | Status: DC
Start: 2015-07-14 — End: 2015-07-15
  Administered 2015-07-14: 750 mg via ORAL
  Filled 2015-07-14: qty 1

## 2015-07-14 NOTE — Progress Notes (Signed)
PROGRESS NOTE    [name]  KDT:[id number] DOB: [birth year] DOA: 07/11/2015 PCP: Reginia Naas, MD   Brief Narrative: Marcia [name] is a 81 y.o. woman with a history of dementia, RA, DM, dyslipidemia, and lung cancer who is S/P surgical intervention for left intertrochanteric hip fracture at [facility] on 5/13. She was discharged to SNF on 5/19. Of note, she was discharged on supplement oxygen, which is new for her (no coinciding diagnosis per family). The patient is obtunded and unable to give any clinical history. Her husband reports that she has acute mental status changes earlier today. She did not eat well with breakfast, and subsequently developed nausea and vomiting. She was found to have abdominal distention. No fever. No indication of chest pain or shortness of breath. No cough. The patient was found to have acute urinary retention (greater than 1L of urine in her bladder). U/A shows mild leukocytes but TNTC WBC; nitrite negative. Chest xray interpreted as possible LLL infiltrate. She has a mildy elevated WBC count to 12.5 but no fever. Hospitalist asked to admit for acute encephalopathy attributed to UTI.    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 MRI findings are chronic. Discussed case with neurology, because patient is back to baseline no need to proceed with LP. Patient to Follow up with Primary neurology.  Patient back to baseline per husband.  Continue with treatment of UTI.   2-NPH, chronic.  Discussed case with neurology, because patient is back to baseline no need to proceed  with LP. Patient to Follow up with Primary neurology.   3-UTI; continue with Levaquin. Urine culture growing gram negative rods.  Blood culture no growth to date.   4-Urine retention;  Has foley catheter.  Treat for UTI. Started Flomax.  Treat constipation.  Had I and O overnight due to retention.  If unable to urinate this am, plan to insert foley catheter.   5-DM; continue with lantus 15 units AM and will increase PM dose of lantus to 10 units. . SSI.   Follow CBG.   6-Anemia; follow hb trend. Started protonix. Check for occult blood.  request records from facility.  Hb stable increase to 9.8  7-Hypokalemia; resolved.   8-HTN; start Norvasc. PRN hydralazine.   DVT prophylaxis:lovenox Code Status: full code.  Family Communication: husband at bedside.  Disposition Plan: Back to SNF when culture available and stable 1 or 2 days.    Consultants:   Dr Tobias Alexander over phone/   Procedures:  none  Antimicrobials:   Levaquin 5-25   Subjective: Patient is alert, confuse at baseline. Had BM yesterday and today. Unable to empty her bladder.  Maybe eating more per caregiver.    Objective: Filed Vitals:   07/13/15 2014 07/14/15 0507 07/14/15 1100 07/14/15 1137  BP: 154/61 170/53 150/74 150/74  Pulse: 81 81    Temp: 98.5 F (36.9 C) 98.7 F (37.1 C)    TempSrc: Oral Oral    Resp: 18 20    Height:      Weight:      SpO2: 100% 96%      Intake/Output  Summary (Last 24 hours) at 07/14/15 1150 Last data filed at 07/14/15 0900  Gross per 24 hour  Intake 2806.67 ml  Output   3575 ml  Net -768.33 ml   Filed Weights   07/11/15 2317  Weight: 71.215 kg (157 lb)    Examination:  General exam: Appears calm and comfortable  Respiratory system: Clear to auscultation. Respiratory effort normal. Cardiovascular system: S1 & S2 heard, RRR. No JVD, murmurs, rubs, gallops or clicks. No pedal edema. Gastrointestinal system: Abdomen is nondistended, soft and nontender. No  organomegaly or masses felt. Normal bowel sounds heard. Central nervous system: Alert and oriented times 2. No focal neurological deficits.  Extremities: Symmetric 5 x 5 power. Skin: No rashes, lesions or ulcers    Data Reviewed: I have personally reviewed following labs and imaging studies  CBC:  Recent Labs Lab 07/11/15 1519 07/12/15 0333 07/13/15 0346 07/14/15 0800  WBC 12.5* 10.2 6.5 6.6  NEUTROABS 10.8*  --   --   --   HGB 10.4* 9.2* 8.7* 9.8*  HCT 31.5* 28.6* 27.4* 30.6*  MCV 89.2 92.6 92.9 91.6  PLT 437* 383 339 732   Basic Metabolic Panel:  Recent Labs Lab 07/11/15 1519 07/12/15 0333 07/13/15 0346 07/14/15 0800  NA 139 138 139 134*  K 3.3* 3.1* 3.0* 3.9  CL 98* 102 102 99*  CO2 32 '30 31 29  '$ GLUCOSE 341* 283* 175* 275*  BUN 36* 27* 18 9  CREATININE 1.05* 0.82 0.62 0.53  CALCIUM 9.3 8.8* 8.9 8.4*   GFR: Estimated Creatinine Clearance: 57.4 mL/min (by C-G formula based on Cr of 0.53). Liver Function Tests:  Recent Labs Lab 07/11/15 1519  AST 21  ALT 19  ALKPHOS 94  BILITOT 1.3*  PROT 6.0*  ALBUMIN 2.9*   No results for input(s): LIPASE, AMYLASE in the last 168 hours. No results for input(s): AMMONIA in the last 168 hours. Coagulation Profile:  Recent Labs Lab 07/11/15 1519  INR 1.11   Cardiac Enzymes:  Recent Labs Lab 07/11/15 1519  TROPONINI 0.03   BNP (last 3 results) No results for input(s): PROBNP in the last 8760 hours. HbA1C:  Recent Labs  07/12/15 0333  HGBA1C 10.6*   CBG:  Recent Labs Lab 07/13/15 0747 07/13/15 1208 07/13/15 1611 07/13/15 2155 07/14/15 0730  GLUCAP 168* 213* 219* 238* 273*   Lipid Profile: No results for input(s): CHOL, HDL, LDLCALC, TRIG, CHOLHDL, LDLDIRECT in the last 72 hours. Thyroid Function Tests: No results for input(s): TSH, T4TOTAL, FREET4, T3FREE, THYROIDAB in the last 72 hours. Anemia Panel: No results for input(s): VITAMINB12, FOLATE, FERRITIN, TIBC, IRON, RETICCTPCT in the last  72 hours. Sepsis Labs:  Recent Labs Lab 07/11/15 1824  LATICACIDVEN 1.23    Recent Results (from the past 240 hour(s))  Urine culture     Status: Abnormal (Preliminary result)   Collection Time: 07/11/15  4:54 PM  Result Value Ref Range Status   Specimen Description URINE, CATHETERIZED  Final   Special Requests Normal  Final   Culture >=100,000 COLONIES/mL GRAM NEGATIVE RODS (A)  Final   Report Status PENDING  Incomplete  Blood culture (routine x 2)     Status: None (Preliminary result)   Collection Time: 07/11/15  6:15 PM  Result Value Ref Range Status   Specimen Description BLOOD LEFT WRIST  Final   Special Requests BOTTLES DRAWN AEROBIC AND ANAEROBIC 5 ML  Final   Culture   Final    NO GROWTH 1 DAY Performed at [facility]    Report Status PENDING  Incomplete  Blood culture (routine x 2)     Status: None (Preliminary result)   Collection Time: 07/12/15  3:33 AM  Result Value Ref Range Status   Specimen Description BLOOD RIGHT HAND  Final   Special Requests IN PEDIATRIC BOTTLE 2ML  Final   Culture   Final    NO GROWTH 1 DAY Performed at [facility]    Report Status PENDING  Incomplete  MRSA PCR Screening     Status: Abnormal   Collection Time: 07/12/15  5:30 AM  Result Value Ref Range Status   MRSA by PCR POSITIVE (A) NEGATIVE Final    Comment: RESULT CALLED TO, READ BACK BY AND VERIFIED WITH: HERNDON,F. RN '@1022'$  ON 5.26.17 BY MCCOY,N.        The GeneXpert MRSA Assay (FDA approved for NASAL specimens only), is one component of a comprehensive MRSA colonization surveillance program. It is not intended to diagnose MRSA infection nor to guide or monitor treatment for MRSA infections.          Radiology Studies: No results found.      Scheduled Meds: . acetaminophen  500 mg Oral BID  . amLODipine  5 mg Oral Daily  . atorvastatin  40 mg Oral q1800  . calcium-vitamin D  1 tablet Oral BID  . Chlorhexidine Gluconate Cloth  6 each  Topical Q0600  . donepezil  23 mg Oral QHS  . ezetimibe  10 mg Oral Daily  . feeding supplement (GLUCERNA SHAKE)  237 mL Oral BID BM  . ferrous sulfate  325 mg Oral BID WC  . insulin aspart  0-15 Units Subcutaneous TID WC  . insulin aspart  0-5 Units Subcutaneous QHS  . insulin glargine  10 Units Subcutaneous QHS  . insulin glargine  15 Units Subcutaneous Daily  . levofloxacin (LEVAQUIN) IV  750 mg Intravenous Q24H  . memantine  10 mg Oral BID  . mirabegron ER  50 mg Oral Daily  . mupirocin ointment  1 application Nasal BID  . pantoprazole (PROTONIX) IV  40 mg Intravenous Q12H  . polyethylene glycol  17 g Oral BID  . predniSONE  5 mg Oral Daily  . tamsulosin  0.4 mg Oral Daily   Continuous Infusions: . sodium chloride 1,000 mL (07/14/15 1131)     LOS: 3 days    Time spent: 35 minutes.     Elmarie Shiley, MD [facility] Pager [phone]  If 7PM-7AM, please contact night-coverage [email or web address] Password [facility] 07/14/2015, 11:50 AM

## 2015-07-14 NOTE — Progress Notes (Signed)
Patient I+O catheterized with 1350 out.  Patient had no urge to void prior to this.

## 2015-07-14 NOTE — Progress Notes (Signed)
PHARMACIST - PHYSICIAN COMMUNICATION DR:   Tyrell Antonio CONCERNING: Antibiotic IV to Oral Route Change Policy  RECOMMENDATION: This patient is receiving Levaquin by the intravenous route.  Based on criteria approved by the Pharmacy and Therapeutics Committee, the antibiotic(s) is/are being converted to the equivalent oral dose form(s).   DESCRIPTION: These criteria include:  Patient being treated for a respiratory tract infection, urinary tract infection, cellulitis or clostridium difficile associated diarrhea if on metronidazole  The patient is not neutropenic and does not exhibit a GI malabsorption state  The patient is eating (either orally or via tube) and/or has been taking other orally administered medications for a least 24 hours  The patient is improving clinically and has a Tmax < 100.5  If you have questions about this conversion, please contact the Pharmacy Department  '[]'$   [phone] )  Marcia [name] '[]'$   [phone] )  [facility] '[]'$   [phone] )  Marcia [name] '[]'$   [phone] )  [facility] '[x]'$   [phone] )  [facility]

## 2015-07-15 DIAGNOSIS — Z882 Allergy status to sulfonamides status: Secondary | ICD-10-CM | POA: Diagnosis not present

## 2015-07-15 DIAGNOSIS — E119 Type 2 diabetes mellitus without complications: Secondary | ICD-10-CM | POA: Diagnosis not present

## 2015-07-15 DIAGNOSIS — Z5181 Encounter for therapeutic drug level monitoring: Secondary | ICD-10-CM | POA: Diagnosis not present

## 2015-07-15 DIAGNOSIS — Z79899 Other long term (current) drug therapy: Secondary | ICD-10-CM | POA: Diagnosis not present

## 2015-07-15 DIAGNOSIS — E784 Other hyperlipidemia: Secondary | ICD-10-CM | POA: Diagnosis not present

## 2015-07-15 DIAGNOSIS — S72009A Fracture of unspecified part of neck of unspecified femur, initial encounter for closed fracture: Secondary | ICD-10-CM | POA: Diagnosis not present

## 2015-07-15 DIAGNOSIS — J9600 Acute respiratory failure, unspecified whether with hypoxia or hypercapnia: Secondary | ICD-10-CM | POA: Diagnosis not present

## 2015-07-15 DIAGNOSIS — M6281 Muscle weakness (generalized): Secondary | ICD-10-CM | POA: Diagnosis not present

## 2015-07-15 DIAGNOSIS — R278 Other lack of coordination: Secondary | ICD-10-CM | POA: Diagnosis not present

## 2015-07-15 DIAGNOSIS — Z87891 Personal history of nicotine dependence: Secondary | ICD-10-CM | POA: Diagnosis not present

## 2015-07-15 DIAGNOSIS — G301 Alzheimer's disease with late onset: Secondary | ICD-10-CM | POA: Diagnosis not present

## 2015-07-15 DIAGNOSIS — S72142D Displaced intertrochanteric fracture of left femur, subsequent encounter for closed fracture with routine healing: Secondary | ICD-10-CM | POA: Diagnosis not present

## 2015-07-15 DIAGNOSIS — Z885 Allergy status to narcotic agent status: Secondary | ICD-10-CM | POA: Diagnosis not present

## 2015-07-15 DIAGNOSIS — E11319 Type 2 diabetes mellitus with unspecified diabetic retinopathy without macular edema: Secondary | ICD-10-CM | POA: Diagnosis not present

## 2015-07-15 DIAGNOSIS — D509 Iron deficiency anemia, unspecified: Secondary | ICD-10-CM | POA: Diagnosis not present

## 2015-07-15 DIAGNOSIS — Z880 Allergy status to penicillin: Secondary | ICD-10-CM | POA: Diagnosis not present

## 2015-07-15 DIAGNOSIS — R339 Retention of urine, unspecified: Secondary | ICD-10-CM | POA: Diagnosis not present

## 2015-07-15 DIAGNOSIS — Z886 Allergy status to analgesic agent status: Secondary | ICD-10-CM | POA: Diagnosis not present

## 2015-07-15 DIAGNOSIS — F0390 Unspecified dementia without behavioral disturbance: Secondary | ICD-10-CM | POA: Diagnosis not present

## 2015-07-15 DIAGNOSIS — Z7982 Long term (current) use of aspirin: Secondary | ICD-10-CM | POA: Diagnosis not present

## 2015-07-15 DIAGNOSIS — E1165 Type 2 diabetes mellitus with hyperglycemia: Secondary | ICD-10-CM | POA: Diagnosis not present

## 2015-07-15 DIAGNOSIS — Z881 Allergy status to other antibiotic agents status: Secondary | ICD-10-CM | POA: Diagnosis not present

## 2015-07-15 DIAGNOSIS — M069 Rheumatoid arthritis, unspecified: Secondary | ICD-10-CM | POA: Diagnosis not present

## 2015-07-15 DIAGNOSIS — R2689 Other abnormalities of gait and mobility: Secondary | ICD-10-CM | POA: Diagnosis not present

## 2015-07-15 DIAGNOSIS — E785 Hyperlipidemia, unspecified: Secondary | ICD-10-CM | POA: Diagnosis not present

## 2015-07-15 DIAGNOSIS — Z4789 Encounter for other orthopedic aftercare: Secondary | ICD-10-CM | POA: Diagnosis not present

## 2015-07-15 DIAGNOSIS — G9340 Encephalopathy, unspecified: Secondary | ICD-10-CM | POA: Diagnosis not present

## 2015-07-15 DIAGNOSIS — M810 Age-related osteoporosis without current pathological fracture: Secondary | ICD-10-CM | POA: Diagnosis not present

## 2015-07-15 DIAGNOSIS — Z794 Long term (current) use of insulin: Secondary | ICD-10-CM | POA: Diagnosis not present

## 2015-07-15 DIAGNOSIS — J449 Chronic obstructive pulmonary disease, unspecified: Secondary | ICD-10-CM | POA: Diagnosis not present

## 2015-07-15 DIAGNOSIS — Z9889 Other specified postprocedural states: Secondary | ICD-10-CM | POA: Diagnosis not present

## 2015-07-15 LAB — URINE CULTURE: SPECIAL REQUESTS: NORMAL

## 2015-07-15 LAB — HEMOGLOBIN AND HEMATOCRIT, BLOOD
HEMATOCRIT: 31.2 % — AB (ref 36.0–46.0)
HEMOGLOBIN: 10.1 g/dL — AB (ref 12.0–15.0)

## 2015-07-15 LAB — GLUCOSE, CAPILLARY
GLUCOSE-CAPILLARY: 201 mg/dL — AB (ref 65–99)
Glucose-Capillary: 161 mg/dL — ABNORMAL HIGH (ref 65–99)

## 2015-07-15 MED ORDER — TAMSULOSIN HCL 0.4 MG PO CAPS: 0.4000 mg | ORAL_CAPSULE | Freq: Every day | ORAL | Status: AC

## 2015-07-15 MED ORDER — FERROUS SULFATE 325 (65 FE) MG PO TABS: 325.0000 mg | ORAL_TABLET | Freq: Two times a day (BID) | ORAL | Status: AC

## 2015-07-15 MED ORDER — CIPROFLOXACIN HCL 500 MG PO TABS: 500.0000 mg | ORAL_TABLET | Freq: Two times a day (BID) | ORAL | Status: DC

## 2015-07-15 MED ORDER — INSULIN GLARGINE 100 UNIT/ML ~~LOC~~ SOLN: 10.0000 [IU] | Freq: Every day | SUBCUTANEOUS | Status: DC

## 2015-07-15 MED ORDER — POLYETHYLENE GLYCOL 3350 17 G PO PACK: 17.0000 g | PACK | Freq: Two times a day (BID) | ORAL | Status: AC

## 2015-07-15 MED ORDER — AMLODIPINE BESYLATE 10 MG PO TABS: 10.0000 mg | ORAL_TABLET | Freq: Every day | ORAL | Status: AC

## 2015-07-15 MED ORDER — CIPROFLOXACIN HCL 500 MG PO TABS
500.0000 mg | ORAL_TABLET | Freq: Two times a day (BID) | ORAL | Status: DC
  Administered 2015-07-15: 500 mg via ORAL
  Filled 2015-07-15: qty 1

## 2015-07-15 MED ORDER — AMLODIPINE BESYLATE 10 MG PO TABS
10.0000 mg | ORAL_TABLET | Freq: Every day | ORAL | Status: DC
  Administered 2015-07-15: 10 mg via ORAL
  Filled 2015-07-15: qty 1

## 2015-07-15 MED ORDER — INSULIN GLARGINE 100 UNIT/ML ~~LOC~~ SOLN: 15.0000 [IU] | Freq: Every day | SUBCUTANEOUS | Status: DC

## 2015-07-15 NOTE — Progress Notes (Signed)
Report given to Tonya at [facility].

## 2015-07-15 NOTE — Progress Notes (Signed)
CSW assisting with d/c planning. Pt is stable for d/c. [facility] is able to admit pt today. Pt / spouse are in agreement with this plan. D/C Summary has been sent to [facility] for review. Scripts included in d/c packet. [facility] transport is required. Medical necessity form completed. Pt / spouse are aware that out of pocket costs may be associated with [facility] transport.  Werner Lean LCSW [phone]

## 2015-07-15 NOTE — Discharge Summary (Signed)
Physician Discharge Summary  Marcia [name] PJK:[id number] DOB: May 09, 1934 DOA: 07/11/2015  PCP: Reginia Naas, MD  Admit date: 07/11/2015 Discharge date: 07/15/2015  Time spent: 35 minutes  Recommendations for Outpatient Follow-up:  Needs referral to urology for urinary retention. Prior to finishing antibiotics.  Needs follow up with Primary neurology Need CBC,ifhbstableconsiderresuminglovenoxforDVT prophylaxis.   Discharge Diagnoses:    Acute encephalopathy   UTI   Cancer of right lung (HCC)   Rheumatoid arthritis involving both shoulders with positive rheumatoid factor ([place])   UTI (lower urinary tract infection)   Dementia   Diabetes (HCC)   Hypokalemia   Acute blood loss anemia   Hyperglycemia   NPH (normal pressure hydrocephalus)   Discharge Condition: stable  Diet recommendation: carb modified diet   Filed Weights   07/11/15 2317  Weight: 71.215 kg (157 lb)    History of present illness:  HPI: Marcia [name] is a 81 y.o. woman with a history of dementia, RA, DM, dyslipidemia, and lung cancer who is S/P surgical intervention for left intertrochanteric hip fracture at [facility] on 5/13. She was discharged to SNF on 5/19. Of note, she was discharged on supplement oxygen, which is new for her (no coinciding diagnosis per family). The patient is obtunded and unable to give any clinical history. Her husband reports that she has acute mental status changes earlier today. She did not eat well with breakfast, and subsequently developed nausea and vomiting. She was found to have abdominal distention. No fever. No indication of chest pain or shortness of breath. No cough.  ED Course: The patient was found to have acute urinary retention (greater than 1L of urine in her bladder). U/A shows mild leukocytes but TNTC WBC; nitrite negative. Chest xray interpreted as possible LLL infiltrate. She has a mildy elevated WBC count to 12.5 but no fever. Hospitalist asked  to admit for acute encephalopathy attributed to UTI.  Hospital Course:  1-Acute encephalopathy; probably related to UTI. MRI findings are chronic. Discussed case with neurology, because patient is back to baseline no need to proceed with LP. Patient to Follow up with Primary neurology.  Patient back to baseline per husband.  Continue with treatment of UTI.  Resolved.   2-NPH, chronic.  Discussed case with neurology, because patient is back to baseline no need to proceed with LP. Patient to Follow up with Primary neurology.   3-UTI; continue with Levaquin. Urine culture grew Klebsiella, sensitive to ciprofloxacin  Blood culture no growth to date.   4-Urine retention;  Has foley catheter.  Treat for UTI. Started Flomax.  Fail voiding trial. Needs to follow up with urology   5-DM; continue with lantus 15 units AM and  lantus to 10 units PM. . Follow CBG. mealscoverage  6-Anemia; follow hb trend. Started protonix. Check for occult blood.  request records from facility.  Hb stable increase to 9.8 Await results for this am, to discharge patient.   7-Hypokalemia; resolved.   8-HTN; started Norvasc. PRN hydralazine.   Procedures:  none   Consultations:  none  Discharge Exam: Filed Vitals:   07/14/15 2051 07/15/15 0601  BP: 141/47 164/64  Pulse: 82 84  Temp: 98.2 F (36.8 C) 98.4 F (36.9 C)  Resp: 18 16    General: NAD Cardiovascular: S 1, S 2 RRR Respiratory: CTA  Discharge Instructions   Discharge Instructions    Diet - low sodium heart healthy    Complete by:  As directed      Increase activity  slowly    Complete by:  As directed           Current Discharge Medication List    START taking these medications   Details  amLODipine (NORVASC) 10 MG tablet Take 1 tablet (10 mg total) by mouth daily. Qty: 30 tablet, Refills: 0    ciprofloxacin (CIPRO) 500 MG tablet Take 1 tablet (500 mg total) by mouth 2 (two) times daily. Qty: 20 tablet, Refills:  0    ferrous sulfate 325 (65 FE) MG tablet Take 1 tablet (325 mg total) by mouth 2 (two) times daily with a meal. Qty: 30 tablet, Refills: 3    polyethylene glycol (MIRALAX / GLYCOLAX) packet Take 17 g by mouth 2 (two) times daily. Qty: 14 each, Refills: 0    tamsulosin (FLOMAX) 0.4 MG CAPS capsule Take 1 capsule (0.4 mg total) by mouth daily. Qty: 30 capsule, Refills: 0      CONTINUE these medications which have CHANGED   Details  !! insulin glargine (LANTUS) 100 UNIT/ML injection Inject 0.15 mLs (15 Units total) into the skin daily. Qty: 10 mL, Refills: 11    !! insulin glargine (LANTUS) 100 UNIT/ML injection Inject 0.1 mLs (10 Units total) into the skin at bedtime. Qty: 10 mL, Refills: 11     !! - Potential duplicate medications found. Please discuss with provider.    CONTINUE these medications which have NOT CHANGED   Details  atorvastatin (LIPITOR) 40 MG tablet Take 40 mg by mouth daily.   Associated Diagnoses: Malignant neoplasm of bronchus and lung, unspecified site    Calcium Carbonate-Vitamin D (CALCIUM-VITAMIN D) 500-200 MG-UNIT tablet Take 1 tablet by mouth 2 (two) times daily.    donepezil (ARICEPT) 23 MG TABS tablet TAKE 1 TABLET (23 MG TOTAL) BY MOUTH AT BEDTIME. Qty: 30 tablet, Refills: 5    ezetimibe (ZETIA) 10 MG tablet Take 10 mg by mouth daily.   Associated Diagnoses: Malignant neoplasm of bronchus and lung, unspecified site    insulin lispro (HUMALOG) 100 UNIT/ML injection Inject 3 Units into the skin 3 (three) times daily with meals.     memantine (NAMENDA) 10 MG tablet TAKE 1 TABLET (10 MG TOTAL) BY MOUTH 2 (TWO) TIMES DAILY. Qty: 60 tablet, Refills: 3    MYRBETRIQ 50 MG TB24 tablet Take 50 mg by mouth daily. Refills: 3   Associated Diagnoses: Rheumatoid arthritis involving both shoulders with positive rheumatoid factor ([place]); Malignant neoplasm of upper lobe of left lung ([place]); Subcortical vascular dementia, without behavioral disturbance     oxycodone (OXY-IR) 5 MG capsule Take 5 mg by mouth every 4 (four) hours as needed for pain.    predniSONE (DELTASONE) 5 MG tablet Take 5 mg by mouth daily.   Associated Diagnoses: Malignant neoplasm of bronchus and lung, unspecified site    sodium phosphate (FLEET) enema Place 1 enema rectally daily as needed (constipation). follow package directions    Vitamin D, Ergocalciferol, (DRISDOL) 50000 UNITS CAPS capsule Take 1 capsule by mouth once a week.      STOP taking these medications     aspirin 81 MG tablet      enoxaparin (LOVENOX) 40 MG/0.4ML injection        Allergies  Allergen Reactions  . Amoxicillin Nausea And Vomiting  . Cefaclor Nausea And Vomiting  . Hydrocodone Nausea And Vomiting  . Sulfa Antibiotics     " legs give out"   Follow-up Information    Follow up with Reginia Naas, MD.   Specialty:  Family Medicine   Contact information:   [address] [place] [address] [phone]        The results of significant diagnostics from this hospitalization (including imaging, microbiology, ancillary and laboratory) are listed below for reference.    Significant Diagnostic Studies: X-ray Chest Pa And Lateral  07/12/2015  CLINICAL DATA:  Shortness of breath.  Pain. EXAM: CHEST  2 VIEW COMPARISON:  07/11/2015. FINDINGS: The heart size is mildly enlarged. There is no pleural effusion or interstitial edema. Volume loss, scarring and architectural distortion is identified within the right upper lobe compatible with postoperative change. No superimposed airspace opacities. IMPRESSION: 1. No acute cardiopulmonary abnormalities. 2. Postoperative change involving the right upper lung is similar to previous exam. Electronically Signed   By: Kerby Moors M.D.   On: 07/12/2015 08:36   Dg Chest 2 View  07/11/2015  CLINICAL DATA:  Lethargy and lack of appetite. Dementia. History of lung cancer post surgery. EXAM: CHEST  2 VIEW COMPARISON:  11/30/2007  and CT 04/09/2015 FINDINGS: Lungs are adequately inflated and demonstrate stable postsurgical changes over the right perihilar region and upper lung. Suggestion mild opacification of the posterior left base/retrocardiac region. Focal pleural fluid collection posteriorly of the right upper thorax unchanged from the recent CT. Cardiomediastinal silhouette is within normal. Remainder of the exam is unchanged. IMPRESSION: Mild opacification in the left base/retrocardiac region which may represent atelectasis or infection. Stable postsurgical changes of the right perihilar region and upper lung. Electronically Signed   By: Marin Olp M.D.   On: 07/11/2015 16:01   Ct Head Wo Contrast  07/11/2015  CLINICAL DATA:  Lethargy today, dementia, fell last week EXAM: CT HEAD WITHOUT CONTRAST TECHNIQUE: Contiguous axial images were obtained from the base of the skull through the vertex without intravenous contrast. COMPARISON:  03/21/2006 FINDINGS: There is diffuse cortical atrophy with low attenuation in the deep white matter. These findings were present previously but have progressed. There is prominence of the lateral ventricle. Portion allergy to atrophy is difficult to determine, but the temporal horns are prominent. Third ventricle is also prominent as is the fourth. There is no evidence of infarct or mass. There is no hemorrhage or extra-axial fluid. The calvarium is intact. IMPRESSION: Age-related involutional change. Additionally, the ventricles are prominent and proportionality to atrophy is difficult to determine. In particular the temporal horns are prominent which raises concern for hydrocephalus. Third and fourth ventricles also appear prominent and the possibility of communicating hydrocephalus is not excluded. Electronically Signed   By: Skipper Cliche M.D.   On: 07/11/2015 15:55   Mr Jeri Cos QQ Contrast  07/11/2015  CLINICAL DATA:  Lethargy, decreased appetite for 1 day. Hyperglycemia. History of  dementia. EXAM: MRI HEAD WITHOUT AND WITH CONTRAST TECHNIQUE: Multiplanar, multiecho pulse sequences of the brain and surrounding structures were obtained without and with intravenous contrast. CONTRAST:  31m MULTIHANCE GADOBENATE DIMEGLUMINE 529 MG/ML IV SOLN COMPARISON:  CT HEAD Jul 11, 2015 at 1540 hours and MRI head March 22, 2006 FINDINGS: INTRACRANIAL CONTENTS: No reduced diffusion to suggest acute ischemia. No susceptibility artifact to suggest hemorrhage. Moderate to severe ventriculomegaly with disproportionate sulcal effacement at the convexities. Patchy to confluent supratentorial white matter FLAIR T2 hyperintensities. 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 No midline shift, mass effect, mass lesions nor abnormal intraparenchymal enhancement the post gadolinium sequences are moderately motion degraded. Major intracranial vascular flow voids present at skull base. No abnormal extra-axial fluid collections or abnormal enhancement though motion degrades  sensitivity. ORBITS: The included ocular globes and orbital contents are non-suspicious. Status post bilateral ocular lens implants. SINUSES: Paranasal sinuses are well-aerated. Hypoplastic frontal sinuses. Trace RIGHT mastoid effusion. SKULL/SOFT TISSUES: No abnormal sellar expansion. No suspicious calvarial bone marrow signal. Craniocervical junction maintained. IMPRESSION: Moderate to severe hydrocephalus, likely on a chronic basis (normal pressure hydrocephalus). Moderate to severe white matter changes most commonly seen with chronic small vessel ischemic disease, there could be a component interstitial edema. Old small cerebellar infarcts. Electronically Signed   By: Elon Alas M.D.   On: 07/11/2015 22:17    Microbiology: Recent Results (from the past 240 hour(s))  Urine culture     Status: Abnormal   Collection Time: 07/11/15  4:54 PM  Result Value Ref Range Status   Specimen Description URINE, CATHETERIZED  Final   Special  Requests Normal  Final   Culture >=100,000 COLONIES/mL KLEBSIELLA SPECIES (A)  Final   Report Status 07/15/2015 FINAL  Final   Organism ID, Bacteria KLEBSIELLA SPECIES (A)  Final      Susceptibility   Klebsiella species - MIC*    AMPICILLIN >=32 RESISTANT Resistant     CEFAZOLIN <=4 SENSITIVE Sensitive     CEFTRIAXONE <=1 SENSITIVE Sensitive     CIPROFLOXACIN <=0.25 SENSITIVE Sensitive     GENTAMICIN <=1 SENSITIVE Sensitive     IMIPENEM 0.5 SENSITIVE Sensitive     NITROFURANTOIN 64 INTERMEDIATE Intermediate     TRIMETH/SULFA <=20 SENSITIVE Sensitive     AMPICILLIN/SULBACTAM 8 SENSITIVE Sensitive     PIP/TAZO <=4 SENSITIVE Sensitive     * >=100,000 COLONIES/mL KLEBSIELLA SPECIES  Blood culture (routine x 2)     Status: None (Preliminary result)   Collection Time: 07/11/15  6:15 PM  Result Value Ref Range Status   Specimen Description BLOOD LEFT WRIST  Final   Special Requests BOTTLES DRAWN AEROBIC AND ANAEROBIC 5 ML  Final   Culture   Final    NO GROWTH 2 DAYS Performed at [facility]    Report Status PENDING  Incomplete  Blood culture (routine x 2)     Status: None (Preliminary result)   Collection Time: 07/12/15  3:33 AM  Result Value Ref Range Status   Specimen Description BLOOD RIGHT HAND  Final   Special Requests IN PEDIATRIC BOTTLE 2ML  Final   Culture   Final    NO GROWTH 2 DAYS Performed at [facility]    Report Status PENDING  Incomplete  MRSA PCR Screening     Status: Abnormal   Collection Time: 07/12/15  5:30 AM  Result Value Ref Range Status   MRSA by PCR POSITIVE (A) NEGATIVE Final    Comment: RESULT CALLED TO, READ BACK BY AND VERIFIED WITH: HERNDON,F. RN '@1022'$  ON 5.26.17 BY MCCOY,N.        The GeneXpert MRSA Assay (FDA approved for NASAL specimens only), is one component of a comprehensive MRSA colonization surveillance program. It is not intended to diagnose MRSA infection nor to guide or monitor treatment for MRSA infections.       Labs: Basic Metabolic Panel:  Recent Labs Lab 07/11/15 1519 07/12/15 0333 07/13/15 0346 07/14/15 0800  NA 139 138 139 134*  K 3.3* 3.1* 3.0* 3.9  CL 98* 102 102 99*  CO2 32 '30 31 29  '$ GLUCOSE 341* 283* 175* 275*  BUN 36* 27* 18 9  CREATININE 1.05* 0.82 0.62 0.53  CALCIUM 9.3 8.8* 8.9 8.4*   Liver Function Tests:  Recent Labs Lab  07/11/15 1519  AST 21  ALT 19  ALKPHOS 94  BILITOT 1.3*  PROT 6.0*  ALBUMIN 2.9*   No results for input(s): LIPASE, AMYLASE in the last 168 hours. No results for input(s): AMMONIA in the last 168 hours. CBC:  Recent Labs Lab 07/11/15 1519 07/12/15 0333 07/13/15 0346 07/14/15 0800  WBC 12.5* 10.2 6.5 6.6  NEUTROABS 10.8*  --   --   --   HGB 10.4* 9.2* 8.7* 9.8*  HCT 31.5* 28.6* 27.4* 30.6*  MCV 89.2 92.6 92.9 91.6  PLT 437* 383 339 346   Cardiac Enzymes:  Recent Labs Lab 07/11/15 1519  TROPONINI 0.03   BNP: BNP (last 3 results) No results for input(s): BNP in the last 8760 hours.  ProBNP (last 3 results) No results for input(s): PROBNP in the last 8760 hours.  CBG:  Recent Labs Lab 07/14/15 1204 07/14/15 1611 07/14/15 2108 07/15/15 0756 07/15/15 1159  GLUCAP 276* 245* 158* 161* 201*       Signed:  Niel Hummer A MD.  [facility] 07/15/2015, 12:56 PM

## 2015-07-15 NOTE — Progress Notes (Signed)
Physical Therapy Treatment Patient Details Name: Marcia [name] MRN: [id number] DOB: 1934/09/09 Today's Date: 07/15/2015    History of Present Illness 81 yo female admitted with acute encephalopathy. MRI + mod-severe hydrocephalus likely chronic. Recent L hip IM nail 5/14. Hx of dementia, RA, DM, lung ca, osteopenia, neuropathy. Pt is from SNF    PT Comments    Assisted OOB to [facility] then amb a limited distance using B platform EVA walker.    Follow Up Recommendations  SNF     Equipment Recommendations  None recommended by PT    Recommendations for Other Services       Precautions / Restrictions Precautions Precautions: Fall Restrictions Weight Bearing Restrictions: No Other Position/Activity Restrictions: per "care everywhere" notes pt is WBAT L LE    Mobility  Bed Mobility Overal bed mobility: Needs Assistance Bed Mobility: Supine to Sit     Supine to sit: Max assist;+2 for physical assistance;+2 for safety/equipment;HOB elevated     General bed mobility comments: assist for trunk and bil LEs. Utilized bedpad for scooting, positioning. Increased time. Multimodal cues for safety, technique.   Transfers Overall transfer level: Needs assistance Equipment used: Bilateral platform walker (EVA walker) Transfers: Sit to/from Stand Sit to Stand: Max assist;+2 physical assistance;+2 safety/equipment;From elevated surface         General transfer comment: + 2 to rise off elevated bed X 3 times due to incont BM and hygiene.  Assisted to [facility] by switching bed and BSC from behind.  Difficulty supporting self and self limited WB thru L LE due to pain.  Ambulation/Gait Ambulation/Gait assistance: Max assist;+2 physical assistance;+2 safety/equipment Ambulation Distance (Feet): 3 Feet Assistive device: Bilateral platform walker (EVA walker) Gait Pattern/deviations: Step-to pattern;Decreased stance time - left Gait velocity: decreased   General Gait Details: used EVA walker and +  2 assist to attempt amb.  Pt only able to progress to 3 feet with much effort limited by weakness and L LE pain.  Recliner following behind.    Stairs            Wheelchair Mobility    Modified Rankin (Stroke Patients Only)       Balance                                    Cognition Arousal/Alertness: Awake/alert Behavior During Therapy: WFL for tasks assessed/performed Overall Cognitive Status: History of cognitive impairments - at baseline                      Exercises      General Comments        Pertinent Vitals/Pain Pain Assessment: Faces Faces Pain Scale: Hurts little more Pain Location: L LE above knee Pain Descriptors / Indicators: Grimacing;Moaning Pain Intervention(s): Monitored during session;Repositioned    Home Living                      Prior Function            PT Goals (current goals can now be found in the care plan section) Progress towards PT goals: Progressing toward goals    Frequency  Min 3X/week    PT Plan Current plan remains appropriate    Co-evaluation             End of Session Equipment Utilized During Treatment: Gait belt Activity Tolerance: Patient limited by fatigue;Patient limited by  pain Patient left: in chair;with call bell/phone within reach     Time: 1011-1039 PT Time Calculation (min) (ACUTE ONLY): 28 min  Charges:  $Gait Training: 8-22 mins $Therapeutic Activity: 8-22 mins                    G Codes:      Rica Koyanagi  PTA [facility]  Acute  Rehab Pager      [phone]

## 2015-07-17 LAB — CULTURE, BLOOD (ROUTINE X 2)
CULTURE: NO GROWTH
CULTURE: NO GROWTH

## 2015-07-24 DIAGNOSIS — S72142D Displaced intertrochanteric fracture of left femur, subsequent encounter for closed fracture with routine healing: Secondary | ICD-10-CM | POA: Diagnosis not present

## 2015-07-24 DIAGNOSIS — Z4789 Encounter for other orthopedic aftercare: Secondary | ICD-10-CM | POA: Diagnosis not present

## 2015-07-24 DIAGNOSIS — R339 Retention of urine, unspecified: Secondary | ICD-10-CM | POA: Diagnosis not present

## 2015-07-29 DIAGNOSIS — E11319 Type 2 diabetes mellitus with unspecified diabetic retinopathy without macular edema: Secondary | ICD-10-CM | POA: Diagnosis not present

## 2015-07-29 DIAGNOSIS — D509 Iron deficiency anemia, unspecified: Secondary | ICD-10-CM | POA: Diagnosis not present

## 2015-07-29 DIAGNOSIS — G301 Alzheimer's disease with late onset: Secondary | ICD-10-CM | POA: Diagnosis not present

## 2015-07-29 DIAGNOSIS — Z4789 Encounter for other orthopedic aftercare: Secondary | ICD-10-CM | POA: Diagnosis not present

## 2015-07-29 DIAGNOSIS — M069 Rheumatoid arthritis, unspecified: Secondary | ICD-10-CM | POA: Diagnosis not present

## 2015-07-29 DIAGNOSIS — J449 Chronic obstructive pulmonary disease, unspecified: Secondary | ICD-10-CM | POA: Diagnosis not present

## 2015-07-29 DIAGNOSIS — E784 Other hyperlipidemia: Secondary | ICD-10-CM | POA: Diagnosis not present

## 2015-08-03 ENCOUNTER — Encounter (HOSPITAL_COMMUNITY): Payer: Self-pay | Admitting: *Deleted

## 2015-08-03 ENCOUNTER — Emergency Department (HOSPITAL_COMMUNITY)
Admission: EM | Admit: 2015-08-03 | Discharge: 2015-08-03 | Disposition: A | Discharge status: Home / Self Care | Payer: Medicare Other | Attending: Emergency Medicine | Admitting: Emergency Medicine

## 2015-08-03 DIAGNOSIS — E119 Type 2 diabetes mellitus without complications: Secondary | ICD-10-CM | POA: Insufficient documentation

## 2015-08-03 DIAGNOSIS — M069 Rheumatoid arthritis, unspecified: Secondary | ICD-10-CM | POA: Insufficient documentation

## 2015-08-03 DIAGNOSIS — Z794 Long term (current) use of insulin: Secondary | ICD-10-CM | POA: Insufficient documentation

## 2015-08-03 DIAGNOSIS — Z85118 Personal history of other malignant neoplasm of bronchus and lung: Secondary | ICD-10-CM | POA: Insufficient documentation

## 2015-08-03 DIAGNOSIS — R339 Retention of urine, unspecified: Secondary | ICD-10-CM | POA: Diagnosis not present

## 2015-08-03 DIAGNOSIS — Z87891 Personal history of nicotine dependence: Secondary | ICD-10-CM | POA: Insufficient documentation

## 2015-08-03 DIAGNOSIS — E785 Hyperlipidemia, unspecified: Secondary | ICD-10-CM | POA: Insufficient documentation

## 2015-08-03 DIAGNOSIS — Z7982 Long term (current) use of aspirin: Secondary | ICD-10-CM | POA: Insufficient documentation

## 2015-08-03 DIAGNOSIS — Z79899 Other long term (current) drug therapy: Secondary | ICD-10-CM | POA: Insufficient documentation

## 2015-08-03 LAB — URINALYSIS, ROUTINE W REFLEX MICROSCOPIC
Bilirubin Urine: NEGATIVE
Glucose, UA: 100 mg/dL — AB
Ketones, ur: NEGATIVE mg/dL
NITRITE: NEGATIVE
PROTEIN: NEGATIVE mg/dL
SPECIFIC GRAVITY, URINE: 1.014 (ref 1.005–1.030)
pH: 7 (ref 5.0–8.0)

## 2015-08-03 LAB — I-STAT CHEM 8, ED
BUN: 14 mg/dL (ref 6–20)
CALCIUM ION: 1.16 mmol/L (ref 1.13–1.30)
CHLORIDE: 101 mmol/L (ref 101–111)
Creatinine, Ser: 0.6 mg/dL (ref 0.44–1.00)
GLUCOSE: 126 mg/dL — AB (ref 65–99)
HCT: 43 % (ref 36.0–46.0)
Hemoglobin: 14.6 g/dL (ref 12.0–15.0)
Potassium: 3.5 mmol/L (ref 3.5–5.1)
Sodium: 143 mmol/L (ref 135–145)
TCO2: 28 mmol/L (ref 0–100)

## 2015-08-03 LAB — URINE MICROSCOPIC-ADD ON
RBC / HPF: NONE SEEN RBC/hpf (ref 0–5)
SQUAMOUS EPITHELIAL / LPF: NONE SEEN

## 2015-08-03 NOTE — ED Provider Notes (Signed)
CSN: [id number]     Arrival date & time 08/03/15  1200 History   First MD Initiated Contact with Patient 08/03/15 1228     Chief Complaint  Patient presents with  . Urinary Retention     (Consider location/radiation/quality/duration/timing/severity/associated sxs/prior Treatment) HPI Comments: Patient here complaining of 12 hour history of decreased urine output she does have a Foley catheter was placed several weeks ago. Also has UTI. Home health nurse noted decreased urine in her Foley bag and patient was sent here. According to the family, patient has a baseline history of dementia and today is no different. They report no fever or vomiting. No change in her mental status.  The history is provided by the patient and a caregiver.    Past Medical History  Diagnosis Date  . Diabetes mellitus   . Rheumatoid arthritis(714.0)   . Colonic polyp   . Neuropathy ([place])     peripheral  . Goiter     with hypothyrodism  . Osteopenia   . Hyperlipemia   . Lymph node disorder 10R,11R    excision  . Memory loss   . Wears glasses     reading  . Cancer ([place])   . 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 Cesarean section    . Lung surgery Right 2006    recurrance 2009,lower lobe superior segment,resection:2007  . Cataract extraction Bilateral   . Wrist surgery Right   . Foot surgery Right     x3  . Lung surgery Right     secondary lower lobe margin, excision,no tumor indentified  . Lesion removal Right 09/13/2013    Procedure: EXCISION OF RIGHT LEG SQUAMOUS CELL CARCINOMA/RIGHT ARM LESION WITH ACELL;  Surgeon: Theodoro Kos, DO;  Location: [facility];  Service: Plastics;  Laterality: Right;   No family history on file. Social History  Substance Use Topics  . Smoking status: Former Smoker    Quit date: 01/16/2005  . Smokeless tobacco: Never Used  . Alcohol Use: No   OB History    No data available     Review of  Systems  All other systems reviewed and are negative.     Allergies  Amoxicillin; Cefaclor; Hydrocodone; and Sulfa antibiotics  Home Medications   Prior to Admission medications   Medication Sig Start Date End Date Taking? Authorizing Provider  amLODipine (NORVASC) 10 MG tablet Take 1 tablet (10 mg total) by mouth daily. 07/15/15   Belkys A Regalado, MD  atorvastatin (LIPITOR) 40 MG tablet Take 40 mg by mouth daily. 03/25/12   Historical Provider, MD  Calcium Carbonate-Vitamin D (CALCIUM-VITAMIN D) 500-200 MG-UNIT tablet Take 1 tablet by mouth 2 (two) times daily.    Historical Provider, MD  ciprofloxacin (CIPRO) 500 MG tablet Take 1 tablet (500 mg total) by mouth 2 (two) times daily. 07/15/15   Belkys A Regalado, MD  donepezil (ARICEPT) 23 MG TABS tablet TAKE 1 TABLET (23 MG TOTAL) BY MOUTH AT BEDTIME. 05/02/15   Asencion Partridge Dohmeier, MD  ezetimibe (ZETIA) 10 MG tablet Take 10 mg by mouth daily.    Historical Provider, MD  ferrous sulfate 325 (65 FE) MG tablet Take 1 tablet (325 mg total) by mouth 2 (two) times daily with a meal. 07/15/15   Belkys A Regalado, MD  insulin glargine (LANTUS) 100 UNIT/ML injection Inject 0.15 mLs (15 Units total) into the skin daily. 07/15/15   Elmarie Shiley, MD  insulin glargine (LANTUS) 100 UNIT/ML injection Inject 0.1 mLs (10 Units total) into the skin at bedtime. 07/15/15   Belkys A Regalado, MD  insulin lispro (HUMALOG) 100 UNIT/ML injection Inject 3 Units into the skin 3 (three) times daily with meals.  07/05/15   Historical Provider, MD  memantine (NAMENDA) 10 MG tablet TAKE 1 TABLET (10 MG TOTAL) BY MOUTH 2 (TWO) TIMES DAILY. 05/02/15   Carmen Dohmeier, MD  MYRBETRIQ 50 MG TB24 tablet Take 50 mg by mouth daily. 05/07/15   Historical Provider, MD  oxycodone (OXY-IR) 5 MG capsule Take 5 mg by mouth every 4 (four) hours as needed for pain.    Historical Provider, MD  polyethylene glycol (MIRALAX / GLYCOLAX) packet Take 17 g by mouth 2 (two) times daily. 07/15/15    Belkys A Regalado, MD  predniSONE (DELTASONE) 5 MG tablet Take 5 mg by mouth daily.    Historical Provider, MD  sodium phosphate (FLEET) enema Place 1 enema rectally daily as needed (constipation). follow package directions    Historical Provider, MD  tamsulosin (FLOMAX) 0.4 MG CAPS capsule Take 1 capsule (0.4 mg total) by mouth daily. 07/15/15   Belkys A Regalado, MD  Vitamin D, Ergocalciferol, (DRISDOL) 50000 UNITS CAPS capsule Take 1 capsule by mouth once a week. 07/23/13   Historical Provider, MD   SpO2 94% Physical Exam  Constitutional: She appears well-developed and well-nourished.  Non-toxic appearance. No distress.  HENT:  Head: Normocephalic and atraumatic.  Eyes: Conjunctivae, EOM and lids are normal. Pupils are equal, round, and reactive to light.  Neck: Normal range of motion. Neck supple. No tracheal deviation present. No thyroid mass present.  Cardiovascular: Normal rate, regular rhythm and normal heart sounds.  Exam reveals no gallop.   No murmur heard. Pulmonary/Chest: Effort normal and breath sounds normal. No stridor. No respiratory distress. She has no decreased breath sounds. She has no wheezes. She has no rhonchi. She has no rales.  Abdominal: Soft. Normal appearance and bowel sounds are normal. She exhibits no distension. There is tenderness in the suprapubic area. There is no rigidity, no rebound, no guarding and no CVA tenderness.    Musculoskeletal: Normal range of motion. She exhibits no edema or tenderness.  Neurological: She is alert. No cranial nerve deficit or sensory deficit. GCS eye subscore is 4. GCS verbal subscore is 4. GCS motor subscore is 6.  Skin: Skin is warm and dry. No abrasion and no rash noted.  Psychiatric: She has a normal mood and affect. Her speech is normal and behavior is normal.  Nursing note and vitals reviewed.   ED Course  Procedures (including critical care time) Labs Review Labs Reviewed  URINE CULTURE  URINALYSIS, ROUTINE W REFLEX  MICROSCOPIC (NOT AT [facility])  I-STAT CHEM 8, ED    Imaging Review No results found. I have personally reviewed and evaluated these images and lab results as part of my medical decision-making.   EKG Interpretation None      MDM   Final diagnoses:  None    Patient's Foley changed by nursing staff. It is now working appropriately at this time. Patient's creatinine is stable. Her urinalysis was noted and she is asymptomatic. Patient has follow-up with urology on Monday for urodynamic testing. I will instruct him to have a repeat urinalysis done at that time. Urine culture sent    Lacretia Leigh, MD 08/03/15 [phone]

## 2015-08-03 NOTE — ED Notes (Signed)
Bed: PJ24 Expected date: 08/03/15 Expected time: 11:53 AM Means of arrival: Ambulance Comments: Decreased U/O

## 2015-08-03 NOTE — Discharge Instructions (Signed)
Have Dr. Rosana Hoes do a repeat urinalysis on Monday Foley Catheter Care, Adult A Foley catheter is a soft, flexible tube that is placed into the bladder to drain urine. A Foley catheter may be inserted if:  You leak urine or are not able to control when you urinate (urinary incontinence).  You are not able to urinate when you need to (urinary retention).  You had prostate surgery or surgery on the genitals.  You have certain medical conditions, such as multiple sclerosis, dementia, or a spinal cord injury. If you are going home with a Foley catheter in place, follow the instructions below. TAKING CARE OF THE CATHETER 1. Wash your hands with soap and water. 2. Using mild soap and warm water on a clean washcloth:  Clean the area on your body closest to the catheter insertion site using a circular motion, moving away from the catheter. Never wipe toward the catheter because this could sweep bacteria up into the urethra and cause infection.  Remove all traces of soap. Pat the area dry with a clean towel. For males, reposition the foreskin. 3. Attach the catheter to your leg so there is no tension on the catheter. Use adhesive tape or a leg strap. If you are using adhesive tape, remove any sticky residue left behind by the previous tape you used. 4. Keep the drainage bag below the level of the bladder, but keep it off the floor. 5. Check throughout the day to be sure the catheter is working and urine is draining freely. Make sure the tubing does not become kinked. 6. Do not pull on the catheter or try to remove it. Pulling could damage internal tissues. TAKING CARE OF THE DRAINAGE BAGS You will be given two drainage bags to take home. One is a large overnight drainage bag, and the other is a smaller leg bag that fits underneath clothing. You may wear the overnight bag at any time, but you should never wear the smaller leg bag at night. Follow the instructions below for how to empty, change, and clean  your drainage bags. Emptying the Drainage Bag You must empty your drainage bag when it is  - full or at least 2-3 times a day. 1. Wash your hands with soap and water. 2. Keep the drainage bag below your hips, below the level of your bladder. This stops urine from going back into the tubing and into your bladder. 3. Hold the dirty bag over the toilet or a clean container. 4. Open the pour spout at the bottom of the bag and empty the urine into the toilet or container. Do not let the pour spout touch the toilet, container, or any other surface. Doing so can place bacteria on the bag, which can cause an infection. 5. Clean the pour spout with a gauze pad or cotton ball that has rubbing alcohol on it. 6. Close the pour spout. 7. Attach the bag to your leg with adhesive tape or a leg strap. 8. Wash your hands well. Changing the Drainage Bag Change your drainage bag once a month or sooner if it starts to smell bad or look dirty. Below are steps to follow when changing the drainage bag. 1. Wash your hands with soap and water. 2. Pinch off the rubber catheter so that urine does not spill out. 3. Disconnect the catheter tube from the drainage tube at the connection valve. Do not let the tubes touch any surface. 4. Clean the end of the catheter tube with an  alcohol wipe. Use a different alcohol wipe to clean the end of the drainage tube. 5. Connect the catheter tube to the drainage tube of the clean drainage bag. 6. Attach the new bag to the leg with adhesive tape or a leg strap. Avoid attaching the new bag too tightly. 7. Wash your hands well. Cleaning the Drainage Bag 1. Wash your hands with soap and water. 2. Wash the bag in warm, soapy water. 3. Rinse the bag thoroughly with warm water. 4. Fill the bag with a solution of white vinegar and water (1 cup vinegar to 1 qt warm water [.2 L vinegar to 1 L warm water]). Close the bag and soak it for 30 minutes in the solution. 5. Rinse the bag with warm  water. 6. Hang the bag to dry with the pour spout open and hanging downward. 7. Store the clean bag (once it is dry) in a clean plastic bag. 8. Wash your hands well. PREVENTING INFECTION  Wash your hands before and after handling your catheter.  Take showers daily and wash the area where the catheter enters your body. Do not take baths. Replace wet leg straps with dry ones, if this applies.  Do not use powders, sprays, or lotions on the genital area. Only use creams, lotions, or ointments as directed by your caregiver.  For females, wipe from front to back after each bowel movement.  Drink enough fluids to keep your urine clear or pale yellow unless you have a fluid restriction.  Do not let the drainage bag or tubing touch or lie on the floor.  Wear cotton underwear to absorb moisture and to keep your skin drier. SEEK MEDICAL CARE IF:   Your urine is cloudy or smells unusually bad.  Your catheter becomes clogged.  You are not draining urine into the bag or your bladder feels full.  Your catheter starts to leak. SEEK IMMEDIATE MEDICAL CARE IF:   You have pain, swelling, redness, or pus where the catheter enters the body.  You have pain in the abdomen, legs, lower back, or bladder.  You have a fever.  You see blood fill the catheter, or your urine is pink or red.  You have nausea, vomiting, or chills.  Your catheter gets pulled out. MAKE SURE YOU:   Understand these instructions.  Will watch your condition.  Will get help right away if you are not doing well or get worse.   This information is not intended to replace advice given to you by your health care provider. Make sure you discuss any questions you have with your health care provider.   Document Released: 02/02/2005 Document Revised: 06/19/2013 Document Reviewed: 01/25/2012 Elsevier Interactive Patient Education [employer].

## 2015-08-03 NOTE — ED Notes (Signed)
Per EMS pt coming from home with c/o urinary retention, EMS reports pt was discharged yesterday from [place] home with foley catheter that was emptied prior to discharge. This morning home health nurse came in to assess pt and her UO was only 25 ml in over 24 hours.

## 2015-08-03 NOTE — ED Notes (Signed)
[facility] will be in route to pick up pt per family request and pt need

## 2015-08-04 DIAGNOSIS — L89610 Pressure ulcer of right heel, unstageable: Secondary | ICD-10-CM | POA: Diagnosis not present

## 2015-08-04 DIAGNOSIS — S72002D Fracture of unspecified part of neck of left femur, subsequent encounter for closed fracture with routine healing: Secondary | ICD-10-CM | POA: Diagnosis not present

## 2015-08-04 DIAGNOSIS — J9610 Chronic respiratory failure, unspecified whether with hypoxia or hypercapnia: Secondary | ICD-10-CM | POA: Diagnosis not present

## 2015-08-04 DIAGNOSIS — L89620 Pressure ulcer of left heel, unstageable: Secondary | ICD-10-CM | POA: Diagnosis not present

## 2015-08-04 LAB — URINE CULTURE: Culture: >=100000 — AB

## 2015-08-05 DIAGNOSIS — R339 Retention of urine, unspecified: Secondary | ICD-10-CM | POA: Diagnosis not present

## 2015-08-05 DIAGNOSIS — S72009A Fracture of unspecified part of neck of unspecified femur, initial encounter for closed fracture: Secondary | ICD-10-CM | POA: Diagnosis not present

## 2015-08-05 DIAGNOSIS — S72002D Fracture of unspecified part of neck of left femur, subsequent encounter for closed fracture with routine healing: Secondary | ICD-10-CM | POA: Diagnosis not present

## 2015-08-05 DIAGNOSIS — L89610 Pressure ulcer of right heel, unstageable: Secondary | ICD-10-CM | POA: Diagnosis not present

## 2015-08-07 DIAGNOSIS — L89610 Pressure ulcer of right heel, unstageable: Secondary | ICD-10-CM | POA: Diagnosis not present

## 2015-08-07 DIAGNOSIS — S72002D Fracture of unspecified part of neck of left femur, subsequent encounter for closed fracture with routine healing: Secondary | ICD-10-CM | POA: Diagnosis not present

## 2015-08-08 DIAGNOSIS — L89610 Pressure ulcer of right heel, unstageable: Secondary | ICD-10-CM | POA: Diagnosis not present

## 2015-08-08 DIAGNOSIS — S72002D Fracture of unspecified part of neck of left femur, subsequent encounter for closed fracture with routine healing: Secondary | ICD-10-CM | POA: Diagnosis not present

## 2015-08-09 DIAGNOSIS — N312 Flaccid neuropathic bladder, not elsewhere classified: Secondary | ICD-10-CM | POA: Diagnosis not present

## 2015-08-09 DIAGNOSIS — L89610 Pressure ulcer of right heel, unstageable: Secondary | ICD-10-CM | POA: Diagnosis not present

## 2015-08-09 DIAGNOSIS — S72002D Fracture of unspecified part of neck of left femur, subsequent encounter for closed fracture with routine healing: Secondary | ICD-10-CM | POA: Diagnosis not present

## 2015-08-09 DIAGNOSIS — B372 Candidiasis of skin and nail: Secondary | ICD-10-CM | POA: Diagnosis not present

## 2015-08-09 DIAGNOSIS — R339 Retention of urine, unspecified: Secondary | ICD-10-CM | POA: Diagnosis not present

## 2015-08-10 DIAGNOSIS — L89610 Pressure ulcer of right heel, unstageable: Secondary | ICD-10-CM | POA: Diagnosis not present

## 2015-08-10 DIAGNOSIS — S72002D Fracture of unspecified part of neck of left femur, subsequent encounter for closed fracture with routine healing: Secondary | ICD-10-CM | POA: Diagnosis not present

## 2015-08-11 ENCOUNTER — Encounter (HOSPITAL_COMMUNITY): Payer: Self-pay | Admitting: Emergency Medicine

## 2015-08-11 ENCOUNTER — Emergency Department (HOSPITAL_COMMUNITY)
Admission: EM | Admit: 2015-08-11 | Discharge: 2015-08-11 | Disposition: A | Discharge status: Home / Self Care | Payer: Medicare Other | Attending: Emergency Medicine | Admitting: Emergency Medicine

## 2015-08-11 DIAGNOSIS — M069 Rheumatoid arthritis, unspecified: Secondary | ICD-10-CM | POA: Insufficient documentation

## 2015-08-11 DIAGNOSIS — F0390 Unspecified dementia without behavioral disturbance: Secondary | ICD-10-CM | POA: Insufficient documentation

## 2015-08-11 DIAGNOSIS — Z87891 Personal history of nicotine dependence: Secondary | ICD-10-CM | POA: Diagnosis not present

## 2015-08-11 DIAGNOSIS — E785 Hyperlipidemia, unspecified: Secondary | ICD-10-CM | POA: Diagnosis not present

## 2015-08-11 DIAGNOSIS — Z794 Long term (current) use of insulin: Secondary | ICD-10-CM | POA: Diagnosis not present

## 2015-08-11 DIAGNOSIS — N390 Urinary tract infection, site not specified: Secondary | ICD-10-CM | POA: Diagnosis not present

## 2015-08-11 DIAGNOSIS — E119 Type 2 diabetes mellitus without complications: Secondary | ICD-10-CM | POA: Insufficient documentation

## 2015-08-11 LAB — URINALYSIS, ROUTINE W REFLEX MICROSCOPIC
GLUCOSE, UA: NEGATIVE mg/dL
KETONES UR: 40 mg/dL — AB
NITRITE: NEGATIVE
PH: 6 (ref 5.0–8.0)
Protein, ur: NEGATIVE mg/dL
SPECIFIC GRAVITY, URINE: 1.015 (ref 1.005–1.030)

## 2015-08-11 LAB — BASIC METABOLIC PANEL
ANION GAP: 10 (ref 5–15)
BUN: 14 mg/dL (ref 6–20)
CHLORIDE: 101 mmol/L (ref 101–111)
CO2: 27 mmol/L (ref 22–32)
Calcium: 9.1 mg/dL (ref 8.9–10.3)
Creatinine, Ser: 0.67 mg/dL (ref 0.44–1.00)
GFR calc non Af Amer: >60 mL/min (ref >60)
Glucose, Bld: 186 mg/dL — ABNORMAL HIGH (ref 65–99)
Potassium: 3.8 mmol/L (ref 3.5–5.1)
Sodium: 138 mmol/L (ref 135–145)

## 2015-08-11 LAB — CBC WITH DIFFERENTIAL/PLATELET
BASOS ABS: 0 10*3/uL (ref 0.0–0.1)
BASOS PCT: 0 %
EOS ABS: 0.1 10*3/uL (ref 0.0–0.7)
Eosinophils Relative: 1 %
HCT: 43.1 % (ref 36.0–46.0)
HEMOGLOBIN: 14.5 g/dL (ref 12.0–15.0)
Lymphocytes Relative: 12 %
Lymphs Abs: 0.8 10*3/uL (ref 0.7–4.0)
MCH: 29.9 pg (ref 26.0–34.0)
MCHC: 33.6 g/dL (ref 30.0–36.0)
MCV: 88.9 fL (ref 78.0–100.0)
Monocytes Absolute: 0.5 10*3/uL (ref 0.1–1.0)
Monocytes Relative: 8 %
NEUTROS PCT: 79 %
Neutro Abs: 4.8 10*3/uL (ref 1.7–7.7)
Platelets: 208 10*3/uL (ref 150–400)
RBC: 4.85 MIL/uL (ref 3.87–5.11)
RDW: 14.6 % (ref 11.5–15.5)
WBC: 6.1 10*3/uL (ref 4.0–10.5)

## 2015-08-11 LAB — URINE MICROSCOPIC-ADD ON

## 2015-08-11 LAB — POC OCCULT BLOOD, ED: Fecal Occult Bld: NEGATIVE

## 2015-08-11 MED ORDER — CIPROFLOXACIN HCL 500 MG PO TABS: ORAL_TABLET | ORAL | Status: DC

## 2015-08-11 MED ORDER — CIPROFLOXACIN HCL 500 MG PO TABS
500.0000 mg | ORAL_TABLET | Freq: Once | ORAL | Status: AC
  Administered 2015-08-11: 500 mg via ORAL
  Filled 2015-08-11: qty 1

## 2015-08-11 NOTE — ED Notes (Signed)
Bed: WA06 Expected date:  Expected time:  Means of arrival:  Comments: 81 yo UTI?

## 2015-08-11 NOTE — ED Provider Notes (Signed)
CSN: [id number]     Arrival date & time 08/11/15  1020 History   First MD Initiated Contact with Patient 08/11/15 1029     Chief Complaint  Patient presents with  . Fussy  . UTI?    LEVEL 5 CAVEAT DUE TO DEMENTIA The history is provided by the spouse and a relative.  Patient presents for possible altered mental status It is improved, nothing worsens her symptoms Patient with h/o dementia, h/o left hip fx s/p operative repair, presents with being "fussy" at home Husband reports that this morning she aggressive, irritable and wanted to stand up and walk She is currently non-ambulatory No fever/vomiting She had recent constipation but pt had enema last night and stool output Only new med is diflucan No falls  She has 24/7 homecare and husband is also assisting with care Husband called PCP who requested ER evaluation Pt has foley catheter in place due to "underactive" bladder  Past Medical History  Diagnosis Date  . Diabetes mellitus   . Rheumatoid arthritis(714.0)   . Colonic polyp   . Neuropathy ([place])     peripheral  . Goiter     with hypothyrodism  . Osteopenia   . Hyperlipemia   . Lymph node disorder 10R,11R    excision  . Memory loss   . Wears glasses     reading  . Cancer ([place])   . 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 Cesarean section    . Lung surgery Right 2006    recurrance 2009,lower lobe superior segment,resection:2007  . Cataract extraction Bilateral   . Wrist surgery Right   . Foot surgery Right     x3  . Lung surgery Right     secondary lower lobe margin, excision,no tumor indentified  . Lesion removal Right 09/13/2013    Procedure: EXCISION OF RIGHT LEG SQUAMOUS CELL CARCINOMA/RIGHT ARM LESION WITH ACELL;  Surgeon: Theodoro Kos, DO;  Location: [facility];  Service: Plastics;  Laterality: Right;   History reviewed. No pertinent family history. Social History  Substance Use Topics   . Smoking status: Former Smoker    Quit date: 01/16/2005  . Smokeless tobacco: Never Used  . Alcohol Use: No   OB History    No data available     Review of Systems  Unable to perform ROS: Dementia      Allergies  Amoxicillin; Cefaclor; Hydrocodone; and Sulfa antibiotics  Home Medications   Prior to Admission medications   Medication Sig Start Date End Date Taking? Authorizing Provider  amLODipine (NORVASC) 10 MG tablet Take 1 tablet (10 mg total) by mouth daily. 07/15/15   Belkys A Regalado, MD  aspirin EC 81 MG tablet Take 81 mg by mouth daily.    Historical Provider, MD  atorvastatin (LIPITOR) 40 MG tablet Take 40 mg by mouth daily. 03/25/12   Historical Provider, MD  Calcium Carbonate-Vitamin D (CALCIUM-VITAMIN D) 500-200 MG-UNIT tablet Take 1 tablet by mouth 2 (two) times daily.    Historical Provider, MD  ciprofloxacin (CIPRO) 500 MG tablet Take 1 tablet (500 mg total) by mouth 2 (two) times daily. 07/15/15   Belkys A Regalado, MD  donepezil (ARICEPT) 23 MG TABS tablet TAKE 1 TABLET (23 MG TOTAL) BY MOUTH AT BEDTIME. 05/02/15   Asencion Partridge Dohmeier, MD  ezetimibe (ZETIA) 10 MG tablet Take 10 mg by mouth daily.    Historical Provider, MD  ferrous sulfate 325 (65  FE) MG tablet Take 1 tablet (325 mg total) by mouth 2 (two) times daily with a meal. 07/15/15   Belkys A Regalado, MD  insulin glargine (LANTUS) 100 UNIT/ML injection Inject 0.15 mLs (15 Units total) into the skin daily. 07/15/15   Belkys A Regalado, MD  insulin glargine (LANTUS) 100 UNIT/ML injection Inject 0.1 mLs (10 Units total) into the skin at bedtime. 07/15/15   Belkys A Regalado, MD  insulin lispro (HUMALOG) 100 UNIT/ML injection Inject 3 Units into the skin 3 (three) times daily with meals.  07/05/15   Historical Provider, MD  memantine (NAMENDA) 10 MG tablet TAKE 1 TABLET (10 MG TOTAL) BY MOUTH 2 (TWO) TIMES DAILY. 05/02/15   Carmen Dohmeier, MD  MYRBETRIQ 50 MG TB24 tablet Take 50 mg by mouth daily. 05/07/15   Historical  Provider, MD  oxycodone (OXY-IR) 5 MG capsule Take 5 mg by mouth every 4 (four) hours as needed for pain.    Historical Provider, MD  polyethylene glycol (MIRALAX / GLYCOLAX) packet Take 17 g by mouth 2 (two) times daily. 07/15/15   Belkys A Regalado, MD  predniSONE (DELTASONE) 5 MG tablet Take 5 mg by mouth daily.    Historical Provider, MD  sodium phosphate (FLEET) enema Place 1 enema rectally daily as needed (constipation). follow package directions    Historical Provider, MD  tamsulosin (FLOMAX) 0.4 MG CAPS capsule Take 1 capsule (0.4 mg total) by mouth daily. 07/15/15   Belkys A Regalado, MD  Vitamin D, Ergocalciferol, (DRISDOL) 50000 UNITS CAPS capsule Take 1 capsule by mouth once a week. 07/23/13   Historical Provider, MD   BP 142/56 mmHg  Pulse 81  Temp(Src) 98.3 F (36.8 C) (Oral)  Resp 14  SpO2 94% Physical Exam CONSTITUTIONAL: Elderly HEAD: Normocephalic/atraumatic EYES: EOMI ENMT: Mucous membranes moist NECK: supple no meningeal signs SPINE/BACK:entire spine nontender CV: S1/S2 noted, no murmurs/rubs/gallops noted LUNGS: Lungs are clear to auscultation bilaterally, no apparent distress ABDOMEN: soft, nontender XL:KGMWN cath in place, yellow urine in bag NEURO: Pt is awake/alert.  No distress.  She is interactive but does appear confused EXTREMITIES: pulses normal/equal.  No deformities noted SKIN: warm, color normal, no skin breakdown to sacrum PSYCH: unable to assess  ED Course  Procedures  Labs Review Labs Reviewed  BASIC METABOLIC PANEL - Abnormal; Notable for the following:    Glucose, Bld 186 (*)    All other components within normal limits  URINALYSIS, ROUTINE W REFLEX MICROSCOPIC (NOT AT [facility]) - Abnormal; Notable for the following:    APPearance HAZY (*)    Hgb urine dipstick TRACE (*)    Bilirubin Urine MODERATE (*)    Ketones, ur 40 (*)    Leukocytes, UA MODERATE (*)    All other components within normal limits  URINE MICROSCOPIC-ADD ON - Abnormal; Notable  for the following:    Squamous Epithelial / LPF 0-5 (*)    Bacteria, UA RARE (*)    All other components within normal limits  URINE CULTURE  CBC WITH DIFFERENTIAL/PLATELET  POC OCCULT BLOOD, ED   I have personally reviewed and evaluated these  lab results as part of my medical decision-making.  Pt at baseline No distress noted No aggression uti noted This is a new foley, will start cipro due to allergies  Per nursing stool was dark but hemoccult negative and also no anemia  MDM   Final diagnoses:  Complicated UTI (urinary tract infection)    Nursing notes including past medical history and social history  reviewed and considered in documentation Labs/vital reviewed myself and considered during evaluation     Ripley Fraise, MD 08/11/15 1339

## 2015-08-11 NOTE — ED Notes (Signed)
Pt resting at present after pt cleaned after having a black tarry stool, she did c/o left hip pain during turning, small stage one left hip from ? Diaper tape.

## 2015-08-11 NOTE — ED Notes (Signed)
Per EMS. Pt from home, husband is caretaker. Husband states the patient has been more irritable than usual, and that's usually a sign of her having a UTI. Currently has a left broken hip, is bed bound d/t hip, and has an in-dwelling Foley catheter in place on arrival. PCP recommended she come here for eval d/t office closed today

## 2015-08-11 NOTE — ED Notes (Signed)
Waiting for [facility] for transport home

## 2015-08-11 NOTE — ED Notes (Signed)
[facility] present to transport pt home

## 2015-08-12 DIAGNOSIS — L89610 Pressure ulcer of right heel, unstageable: Secondary | ICD-10-CM | POA: Diagnosis not present

## 2015-08-12 DIAGNOSIS — S72002D Fracture of unspecified part of neck of left femur, subsequent encounter for closed fracture with routine healing: Secondary | ICD-10-CM | POA: Diagnosis not present

## 2015-08-12 LAB — URINE CULTURE

## 2015-08-13 ENCOUNTER — Telehealth: Payer: Self-pay | Admitting: Adult Health

## 2015-08-13 NOTE — Telephone Encounter (Signed)
Daughter called back.  Pt had broken hip, is not healing (although no pain, so is wanting to get up). Was at rehab, but crying a lot wanting to go home, they took her home.  She is  on bed rest.  PT is on hold.  Bladder not functioning, has foley catheter and now UTI, currently on 3 days CIPRO.  She is at home now with husband and daughter with 24 hour care.  Husband worn out.  Dr. Forde Dandy (her endocrinologist) ordered trazadone '25mg'$  po qhs for anxiety and sleep, but they have not started due to wanting to check with [place] first.  Also requesting anxety med during day.  This would help pt but also husband (he could rest when she is).  Pt gets agitated when asking her to do something.  Please advise.   [facility].

## 2015-08-13 NOTE — Telephone Encounter (Signed)
I called and LMVM for Viki, daughter that returned call.

## 2015-08-13 NOTE — Telephone Encounter (Signed)
Patient's daughter is calling and would like to catch you up on her mother's condition.  She has a broken hip which is not healing properly so she was taken off PT for 5 or 6 weeks.  Her bladder is  no longer working so she has catheter, UTI, and will not let the nurse change her. She is at home with round the clock nurses but she has become agitated and she can't walk. She has In home nursing services for the past 2 week. [facility] is calling as they are in need a medicatrion for agitation.  Dr. Forde Dandy gave them a  Rx ttrazoone 25 mg for sleep.  If she needs something different for sleep can be changed. Please call.

## 2015-08-13 NOTE — Telephone Encounter (Signed)
Ok to try trazadone. This can help with sleep and mood. I would try this first before adding any additional medication.

## 2015-08-14 DIAGNOSIS — S72002D Fracture of unspecified part of neck of left femur, subsequent encounter for closed fracture with routine healing: Secondary | ICD-10-CM | POA: Diagnosis not present

## 2015-08-14 DIAGNOSIS — L89610 Pressure ulcer of right heel, unstageable: Secondary | ICD-10-CM | POA: Diagnosis not present

## 2015-08-14 NOTE — Telephone Encounter (Signed)
Viki, daughter called back and relayed that pt took trazadone last night, slept from 10PM to 0830 this am.   Has had no breakthru agitation today.   I relayed that MM/NP wanted to try one medication first and see how she does and if has breakthru spells of agitation to call us back, then may consider other daytime med option prn.  Daughter verbalized understanding.  Sleeping may also help regarding behavior.

## 2015-08-14 NOTE — Telephone Encounter (Signed)
I called and LMVM for Marcia [name], that ok to try trazadone for sleep and mood.  See how she does on this prior to adding any other medications.  She is to call back and let us know how she is doing or if questions or concerns.

## 2015-08-15 DIAGNOSIS — L89610 Pressure ulcer of right heel, unstageable: Secondary | ICD-10-CM | POA: Diagnosis not present

## 2015-08-15 DIAGNOSIS — S72002D Fracture of unspecified part of neck of left femur, subsequent encounter for closed fracture with routine healing: Secondary | ICD-10-CM | POA: Diagnosis not present

## 2015-08-16 DIAGNOSIS — L89610 Pressure ulcer of right heel, unstageable: Secondary | ICD-10-CM | POA: Diagnosis not present

## 2015-08-16 DIAGNOSIS — S72002D Fracture of unspecified part of neck of left femur, subsequent encounter for closed fracture with routine healing: Secondary | ICD-10-CM | POA: Diagnosis not present

## 2015-08-19 DIAGNOSIS — L89610 Pressure ulcer of right heel, unstageable: Secondary | ICD-10-CM | POA: Diagnosis not present

## 2015-08-19 DIAGNOSIS — S72002D Fracture of unspecified part of neck of left femur, subsequent encounter for closed fracture with routine healing: Secondary | ICD-10-CM | POA: Diagnosis not present

## 2015-08-22 DIAGNOSIS — Z96642 Presence of left artificial hip joint: Secondary | ICD-10-CM | POA: Diagnosis not present

## 2015-08-22 DIAGNOSIS — S72142A Displaced intertrochanteric fracture of left femur, initial encounter for closed fracture: Secondary | ICD-10-CM | POA: Diagnosis not present

## 2015-08-23 DIAGNOSIS — S72002D Fracture of unspecified part of neck of left femur, subsequent encounter for closed fracture with routine healing: Secondary | ICD-10-CM | POA: Diagnosis not present

## 2015-08-23 DIAGNOSIS — L89610 Pressure ulcer of right heel, unstageable: Secondary | ICD-10-CM | POA: Diagnosis not present

## 2015-08-26 DIAGNOSIS — S72002D Fracture of unspecified part of neck of left femur, subsequent encounter for closed fracture with routine healing: Secondary | ICD-10-CM | POA: Diagnosis not present

## 2015-08-26 DIAGNOSIS — L89610 Pressure ulcer of right heel, unstageable: Secondary | ICD-10-CM | POA: Diagnosis not present

## 2015-08-28 DIAGNOSIS — L89610 Pressure ulcer of right heel, unstageable: Secondary | ICD-10-CM | POA: Diagnosis not present

## 2015-08-28 DIAGNOSIS — S72002D Fracture of unspecified part of neck of left femur, subsequent encounter for closed fracture with routine healing: Secondary | ICD-10-CM | POA: Diagnosis not present

## 2015-08-29 DIAGNOSIS — S72002D Fracture of unspecified part of neck of left femur, subsequent encounter for closed fracture with routine healing: Secondary | ICD-10-CM | POA: Diagnosis not present

## 2015-08-29 DIAGNOSIS — L89610 Pressure ulcer of right heel, unstageable: Secondary | ICD-10-CM | POA: Diagnosis not present

## 2015-08-30 DIAGNOSIS — S72002D Fracture of unspecified part of neck of left femur, subsequent encounter for closed fracture with routine healing: Secondary | ICD-10-CM | POA: Diagnosis not present

## 2015-08-30 DIAGNOSIS — L89610 Pressure ulcer of right heel, unstageable: Secondary | ICD-10-CM | POA: Diagnosis not present

## 2015-09-02 DIAGNOSIS — L89610 Pressure ulcer of right heel, unstageable: Secondary | ICD-10-CM | POA: Diagnosis not present

## 2015-09-02 DIAGNOSIS — S72002D Fracture of unspecified part of neck of left femur, subsequent encounter for closed fracture with routine healing: Secondary | ICD-10-CM | POA: Diagnosis not present

## 2015-09-03 DIAGNOSIS — S72002D Fracture of unspecified part of neck of left femur, subsequent encounter for closed fracture with routine healing: Secondary | ICD-10-CM | POA: Diagnosis not present

## 2015-09-03 DIAGNOSIS — L89610 Pressure ulcer of right heel, unstageable: Secondary | ICD-10-CM | POA: Diagnosis not present

## 2015-09-04 DIAGNOSIS — L89610 Pressure ulcer of right heel, unstageable: Secondary | ICD-10-CM | POA: Diagnosis not present

## 2015-09-04 DIAGNOSIS — S72002D Fracture of unspecified part of neck of left femur, subsequent encounter for closed fracture with routine healing: Secondary | ICD-10-CM | POA: Diagnosis not present

## 2015-09-06 DIAGNOSIS — L89610 Pressure ulcer of right heel, unstageable: Secondary | ICD-10-CM | POA: Diagnosis not present

## 2015-09-06 DIAGNOSIS — S72002D Fracture of unspecified part of neck of left femur, subsequent encounter for closed fracture with routine healing: Secondary | ICD-10-CM | POA: Diagnosis not present

## 2015-09-09 DIAGNOSIS — L89610 Pressure ulcer of right heel, unstageable: Secondary | ICD-10-CM | POA: Diagnosis not present

## 2015-09-09 DIAGNOSIS — S72002D Fracture of unspecified part of neck of left femur, subsequent encounter for closed fracture with routine healing: Secondary | ICD-10-CM | POA: Diagnosis not present

## 2015-09-11 DIAGNOSIS — S72002D Fracture of unspecified part of neck of left femur, subsequent encounter for closed fracture with routine healing: Secondary | ICD-10-CM | POA: Diagnosis not present

## 2015-09-11 DIAGNOSIS — L89610 Pressure ulcer of right heel, unstageable: Secondary | ICD-10-CM | POA: Diagnosis not present

## 2015-09-12 DIAGNOSIS — S72142A Displaced intertrochanteric fracture of left femur, initial encounter for closed fracture: Secondary | ICD-10-CM | POA: Diagnosis not present

## 2015-09-13 DIAGNOSIS — S72002D Fracture of unspecified part of neck of left femur, subsequent encounter for closed fracture with routine healing: Secondary | ICD-10-CM | POA: Diagnosis not present

## 2015-09-13 DIAGNOSIS — L89610 Pressure ulcer of right heel, unstageable: Secondary | ICD-10-CM | POA: Diagnosis not present

## 2015-09-16 DIAGNOSIS — S72002D Fracture of unspecified part of neck of left femur, subsequent encounter for closed fracture with routine healing: Secondary | ICD-10-CM | POA: Diagnosis not present

## 2015-09-16 DIAGNOSIS — L89610 Pressure ulcer of right heel, unstageable: Secondary | ICD-10-CM | POA: Diagnosis not present

## 2015-09-18 DIAGNOSIS — S72002D Fracture of unspecified part of neck of left femur, subsequent encounter for closed fracture with routine healing: Secondary | ICD-10-CM | POA: Diagnosis not present

## 2015-09-18 DIAGNOSIS — L89610 Pressure ulcer of right heel, unstageable: Secondary | ICD-10-CM | POA: Diagnosis not present

## 2015-09-20 DIAGNOSIS — S72002D Fracture of unspecified part of neck of left femur, subsequent encounter for closed fracture with routine healing: Secondary | ICD-10-CM | POA: Diagnosis not present

## 2015-09-20 DIAGNOSIS — L89610 Pressure ulcer of right heel, unstageable: Secondary | ICD-10-CM | POA: Diagnosis not present

## 2015-09-23 DIAGNOSIS — S72002D Fracture of unspecified part of neck of left femur, subsequent encounter for closed fracture with routine healing: Secondary | ICD-10-CM | POA: Diagnosis not present

## 2015-09-23 DIAGNOSIS — L89610 Pressure ulcer of right heel, unstageable: Secondary | ICD-10-CM | POA: Diagnosis not present

## 2015-09-24 DIAGNOSIS — N3289 Other specified disorders of bladder: Secondary | ICD-10-CM | POA: Diagnosis not present

## 2015-09-24 DIAGNOSIS — R399 Unspecified symptoms and signs involving the genitourinary system: Secondary | ICD-10-CM | POA: Diagnosis not present

## 2015-09-26 DIAGNOSIS — L89610 Pressure ulcer of right heel, unstageable: Secondary | ICD-10-CM | POA: Diagnosis not present

## 2015-09-26 DIAGNOSIS — S72002D Fracture of unspecified part of neck of left femur, subsequent encounter for closed fracture with routine healing: Secondary | ICD-10-CM | POA: Diagnosis not present

## 2015-10-17 DIAGNOSIS — S72142D Displaced intertrochanteric fracture of left femur, subsequent encounter for closed fracture with routine healing: Secondary | ICD-10-CM | POA: Diagnosis not present

## 2015-10-22 DIAGNOSIS — R339 Retention of urine, unspecified: Secondary | ICD-10-CM | POA: Diagnosis not present

## 2015-10-22 DIAGNOSIS — F0150 Vascular dementia without behavioral disturbance: Secondary | ICD-10-CM | POA: Diagnosis not present

## 2015-10-22 DIAGNOSIS — N898 Other specified noninflammatory disorders of vagina: Secondary | ICD-10-CM | POA: Diagnosis not present

## 2015-10-22 DIAGNOSIS — M810 Age-related osteoporosis without current pathological fracture: Secondary | ICD-10-CM | POA: Diagnosis not present

## 2015-10-22 DIAGNOSIS — E7800 Pure hypercholesterolemia, unspecified: Secondary | ICD-10-CM | POA: Diagnosis not present

## 2015-10-22 DIAGNOSIS — G4700 Insomnia, unspecified: Secondary | ICD-10-CM | POA: Diagnosis not present

## 2015-10-22 DIAGNOSIS — Z23 Encounter for immunization: Secondary | ICD-10-CM | POA: Diagnosis not present

## 2015-10-22 DIAGNOSIS — I10 Essential (primary) hypertension: Secondary | ICD-10-CM | POA: Diagnosis not present

## 2015-10-22 DIAGNOSIS — E039 Hypothyroidism, unspecified: Secondary | ICD-10-CM | POA: Diagnosis not present

## 2015-10-22 DIAGNOSIS — Z85118 Personal history of other malignant neoplasm of bronchus and lung: Secondary | ICD-10-CM | POA: Diagnosis not present

## 2015-10-23 ENCOUNTER — Encounter (HOSPITAL_BASED_OUTPATIENT_CLINIC_OR_DEPARTMENT_OTHER): Payer: Medicare Other | Attending: Surgery

## 2015-10-23 DIAGNOSIS — Z85828 Personal history of other malignant neoplasm of skin: Secondary | ICD-10-CM | POA: Diagnosis not present

## 2015-10-23 DIAGNOSIS — L97421 Non-pressure chronic ulcer of left heel and midfoot limited to breakdown of skin: Secondary | ICD-10-CM | POA: Diagnosis not present

## 2015-10-23 DIAGNOSIS — I739 Peripheral vascular disease, unspecified: Secondary | ICD-10-CM | POA: Insufficient documentation

## 2015-10-23 DIAGNOSIS — L97412 Non-pressure chronic ulcer of right heel and midfoot with fat layer exposed: Secondary | ICD-10-CM | POA: Diagnosis not present

## 2015-10-23 DIAGNOSIS — F0281 Dementia in other diseases classified elsewhere with behavioral disturbance: Secondary | ICD-10-CM | POA: Insufficient documentation

## 2015-10-23 DIAGNOSIS — E1140 Type 2 diabetes mellitus with diabetic neuropathy, unspecified: Secondary | ICD-10-CM | POA: Insufficient documentation

## 2015-10-23 DIAGNOSIS — M069 Rheumatoid arthritis, unspecified: Secondary | ICD-10-CM | POA: Diagnosis not present

## 2015-10-23 DIAGNOSIS — Z87891 Personal history of nicotine dependence: Secondary | ICD-10-CM | POA: Insufficient documentation

## 2015-10-23 DIAGNOSIS — E11621 Type 2 diabetes mellitus with foot ulcer: Secondary | ICD-10-CM | POA: Insufficient documentation

## 2015-10-23 DIAGNOSIS — Z85118 Personal history of other malignant neoplasm of bronchus and lung: Secondary | ICD-10-CM | POA: Diagnosis not present

## 2015-10-23 DIAGNOSIS — L97521 Non-pressure chronic ulcer of other part of left foot limited to breakdown of skin: Secondary | ICD-10-CM | POA: Diagnosis not present

## 2015-10-25 DIAGNOSIS — L89623 Pressure ulcer of left heel, stage 3: Secondary | ICD-10-CM | POA: Diagnosis not present

## 2015-10-25 DIAGNOSIS — L89613 Pressure ulcer of right heel, stage 3: Secondary | ICD-10-CM | POA: Diagnosis not present

## 2015-10-28 ENCOUNTER — Encounter (HOSPITAL_COMMUNITY): Payer: Self-pay | Admitting: Emergency Medicine

## 2015-10-28 ENCOUNTER — Emergency Department (HOSPITAL_COMMUNITY)
Admission: EM | Admit: 2015-10-28 | Discharge: 2015-10-29 | Disposition: A | Discharge status: Home / Self Care | Payer: Medicare Other | Attending: Emergency Medicine | Admitting: Emergency Medicine

## 2015-10-28 DIAGNOSIS — E039 Hypothyroidism, unspecified: Secondary | ICD-10-CM | POA: Insufficient documentation

## 2015-10-28 DIAGNOSIS — Z85118 Personal history of other malignant neoplasm of bronchus and lung: Secondary | ICD-10-CM | POA: Insufficient documentation

## 2015-10-28 DIAGNOSIS — L89613 Pressure ulcer of right heel, stage 3: Secondary | ICD-10-CM | POA: Diagnosis not present

## 2015-10-28 DIAGNOSIS — E1165 Type 2 diabetes mellitus with hyperglycemia: Secondary | ICD-10-CM | POA: Insufficient documentation

## 2015-10-28 DIAGNOSIS — N390 Urinary tract infection, site not specified: Secondary | ICD-10-CM | POA: Diagnosis not present

## 2015-10-28 DIAGNOSIS — Z87891 Personal history of nicotine dependence: Secondary | ICD-10-CM | POA: Diagnosis not present

## 2015-10-28 DIAGNOSIS — Z7982 Long term (current) use of aspirin: Secondary | ICD-10-CM | POA: Insufficient documentation

## 2015-10-28 DIAGNOSIS — L89623 Pressure ulcer of left heel, stage 3: Secondary | ICD-10-CM | POA: Diagnosis not present

## 2015-10-28 DIAGNOSIS — Z79899 Other long term (current) drug therapy: Secondary | ICD-10-CM | POA: Diagnosis not present

## 2015-10-28 DIAGNOSIS — R309 Painful micturition, unspecified: Secondary | ICD-10-CM | POA: Diagnosis not present

## 2015-10-28 DIAGNOSIS — B9689 Other specified bacterial agents as the cause of diseases classified elsewhere: Secondary | ICD-10-CM | POA: Diagnosis not present

## 2015-10-28 DIAGNOSIS — R739 Hyperglycemia, unspecified: Secondary | ICD-10-CM

## 2015-10-28 DIAGNOSIS — Z794 Long term (current) use of insulin: Secondary | ICD-10-CM | POA: Insufficient documentation

## 2015-10-28 DIAGNOSIS — R4182 Altered mental status, unspecified: Secondary | ICD-10-CM | POA: Diagnosis present

## 2015-10-28 LAB — CBC
HEMATOCRIT: 44.8 % (ref 36.0–46.0)
HEMOGLOBIN: 15.1 g/dL — AB (ref 12.0–15.0)
MCH: 29 pg (ref 26.0–34.0)
MCHC: 33.7 g/dL (ref 30.0–36.0)
MCV: 86 fL (ref 78.0–100.0)
Platelets: 169 10*3/uL (ref 150–400)
RBC: 5.21 MIL/uL — ABNORMAL HIGH (ref 3.87–5.11)
RDW: 14.5 % (ref 11.5–15.5)
WBC: 6.3 10*3/uL (ref 4.0–10.5)

## 2015-10-28 LAB — BASIC METABOLIC PANEL
Anion gap: 9 (ref 5–15)
BUN: 13 mg/dL (ref 6–20)
CALCIUM: 9.1 mg/dL (ref 8.9–10.3)
CHLORIDE: 101 mmol/L (ref 101–111)
CO2: 27 mmol/L (ref 22–32)
CREATININE: 0.77 mg/dL (ref 0.44–1.00)
GFR calc Af Amer: >60 mL/min (ref >60)
GFR calc non Af Amer: >60 mL/min (ref >60)
GLUCOSE: 371 mg/dL — AB (ref 65–99)
Potassium: 4 mmol/L (ref 3.5–5.1)
Sodium: 137 mmol/L (ref 135–145)

## 2015-10-28 LAB — URINE MICROSCOPIC-ADD ON: RBC / HPF: NONE SEEN RBC/hpf (ref 0–5)

## 2015-10-28 LAB — URINALYSIS, ROUTINE W REFLEX MICROSCOPIC
Bilirubin Urine: NEGATIVE
GLUCOSE, UA: 250 mg/dL — AB
HGB URINE DIPSTICK: NEGATIVE
Ketones, ur: NEGATIVE mg/dL
Nitrite: NEGATIVE
PH: 7 (ref 5.0–8.0)
PROTEIN: NEGATIVE mg/dL
SPECIFIC GRAVITY, URINE: 1.013 (ref 1.005–1.030)

## 2015-10-28 LAB — CBG MONITORING, ED
GLUCOSE-CAPILLARY: 231 mg/dL — AB (ref 65–99)
GLUCOSE-CAPILLARY: 275 mg/dL — AB (ref 65–99)

## 2015-10-28 MED ORDER — INSULIN ASPART PROT & ASPART (70-30 MIX) 100 UNIT/ML ~~LOC~~ SUSP
5.0000 [IU] | Freq: Once | SUBCUTANEOUS | Status: AC
  Administered 2015-10-28: 5 [IU] via SUBCUTANEOUS
  Filled 2015-10-28: qty 10

## 2015-10-28 MED ORDER — CEPHALEXIN 500 MG PO CAPS: 500.0000 mg | ORAL_CAPSULE | Freq: Four times a day (QID) | ORAL | 0 refills | Status: DC

## 2015-10-28 MED ORDER — DEXTROSE 5 % IV SOLN
1.0000 g | Freq: Once | INTRAVENOUS | Status: AC
  Administered 2015-10-28: 1 g via INTRAVENOUS
  Filled 2015-10-28: qty 10

## 2015-10-28 MED ORDER — SODIUM CHLORIDE 0.9 % IV BOLUS (SEPSIS)
1000.0000 mL | Freq: Once | INTRAVENOUS | Status: AC
  Administered 2015-10-28: 1000 mL via INTRAVENOUS

## 2015-10-28 MED ORDER — INSULIN ASPART PROT & ASPART (70-30 MIX) 100 UNIT/ML ~~LOC~~ SUSP
5.0000 [IU] | Freq: Once | SUBCUTANEOUS | Status: DC
  Filled 2015-10-28: qty 10

## 2015-10-28 NOTE — ED Notes (Signed)
Bed: WA03 Expected date:  Expected time:  Means of arrival:  Comments: tr3

## 2015-10-28 NOTE — ED Notes (Signed)
Pt becoming increasingly agitated in waiting room waiting for triage.

## 2015-10-28 NOTE — Discharge Instructions (Signed)
Return to the ED with any concerns including fever, vomting and not able to keep down liquids, or any other alarming symptoms

## 2015-10-28 NOTE — ED Triage Notes (Signed)
Per husband, pt has been increasingly agitated and confused over the last few days. Pt has a catheter due to urinary retention/bladder not working properly since leg surgery 4 months ago. Pt had a new catheter placed today. Urine sent to lab but no results at this time. Pt also has hx of dementia. Per family, pt has foul smelling urine as well. Pt currently on doxy for a vaginal infection.

## 2015-10-28 NOTE — ED Notes (Signed)
Informed pt of need for urine.  RN aware.

## 2015-10-28 NOTE — ED Provider Notes (Signed)
[facility] DEPT Provider Note   CSN: [id number] Arrival date & time: 10/28/15  1751     History   Chief Complaint Chief Complaint  Patient presents with  . Recurrent UTI  . Agitation    HPI Marcia [name] is a 81 y.o. female.  HPI   A LEVEL 5 CAVEAT PERTAINS DUE TO DEMENTIA. Pt presenting with c/o altered mental status- family state she has been more confused at home and more restless and combative.  Family states this usually happens with UTIs.  They also report a change in the smell of her urine.  She has a chronic foley for urinary retention- this was changed earlier today.  No fever/chills.  No vomiting.  There are no other associated systemic symptoms, there are no other alleviating or modifying factors.   Past Medical History:  Diagnosis Date  . Cancer ([place])   . Carcinoma ([place])    lung cell-stage1  . Colonic polyp   . Diabetes mellitus   . Goiter    with hypothyrodism  . Hyperlipemia   . Lymph node disorder 10R,11R   excision  . Memory loss   . Neuropathy ([facility])    peripheral  . Osteopenia   . Rheumatoid arthritis(714.0)   . Wears glasses    reading    Patient Active Problem List   Diagnosis Date Noted  . UTI (lower urinary tract infection) 07/11/2015  . Acute encephalopathy 07/11/2015  . Dementia 07/11/2015  . Diabetes ([place]) 07/11/2015  . Hypokalemia 07/11/2015  . Acute blood loss anemia 07/11/2015  . Hyperglycemia 07/11/2015  . Hydrocephalus 07/11/2015  . NPH (normal pressure hydrocephalus) 07/11/2015  . Rheumatoid arthritis involving both shoulders with positive rheumatoid factor ([place]) 05/23/2015  . Malignant neoplasm of upper lobe of left lung ([place]) 05/23/2015  . Subcortical vascular dementia 05/23/2015  . Malignant neoplasm of upper lobe of right lung ([place]) 04/16/2015  . Type II or unspecified type diabetes mellitus with unspecified complication, uncontrolled 06/14/2012  . Vascular dementia, uncomplicated 40/11/2723  . Cancer of right lung ([place])  10/08/2011    Past Surgical History:  Procedure Laterality Date  . CATARACT EXTRACTION Bilateral   . CESAREAN SECTION    . FOOT SURGERY Right    x3  . LESION REMOVAL Right 09/13/2013   Procedure: EXCISION OF RIGHT LEG SQUAMOUS CELL CARCINOMA/RIGHT ARM LESION WITH ACELL;  Surgeon: Theodoro Kos, DO;  Location: [facility];  Service: Plastics;  Laterality: Right;  . LUNG SURGERY Right 2006   recurrance 2009,lower lobe superior segment,resection:2007  . LUNG SURGERY Right    secondary lower lobe margin, excision,no tumor indentified  . WRIST SURGERY Right     OB History    No data available       Home Medications    Prior to Admission medications   Medication Sig Start Date End Date Taking? Authorizing Provider  amLODipine (NORVASC) 10 MG tablet Take 1 tablet (10 mg total) by mouth daily. 07/15/15   Belkys A Regalado, MD  aspirin EC 81 MG tablet Take 81 mg by mouth daily.    Historical Provider, MD  atorvastatin (LIPITOR) 40 MG tablet Take 40 mg by mouth daily. 03/25/12   Historical Provider, MD  Calcium Carbonate-Vitamin D (CALCIUM-VITAMIN D) 500-200 MG-UNIT tablet Take 1 tablet by mouth 2 (two) times daily.    Historical Provider, MD  cephALEXin (KEFLEX) 500 MG capsule Take 1 capsule (500 mg total) by mouth 4 (four) times daily. 10/28/15   Alfonzo Beers, MD  ciprofloxacin (  CIPRO) 500 MG tablet One po bid x 10 days 08/11/15   Ripley Fraise, MD  donepezil (ARICEPT) 23 MG TABS tablet TAKE 1 TABLET (23 MG TOTAL) BY MOUTH AT BEDTIME. 05/02/15   Asencion Partridge Dohmeier, MD  ezetimibe (ZETIA) 10 MG tablet Take 10 mg by mouth daily.    Historical Provider, MD  ferrous sulfate 325 (65 FE) MG tablet Take 1 tablet (325 mg total) by mouth 2 (two) times daily with a meal. 07/15/15   Belkys A Regalado, MD  insulin glargine (LANTUS) 100 UNIT/ML injection Inject 0.15 mLs (15 Units total) into the skin daily. 07/15/15   Belkys A Regalado, MD  insulin glargine (LANTUS) 100 UNIT/ML injection Inject  0.1 mLs (10 Units total) into the skin at bedtime. 07/15/15   Belkys A Regalado, MD  insulin lispro (HUMALOG) 100 UNIT/ML injection Inject 3 Units into the skin 3 (three) times daily with meals.  07/05/15   Historical Provider, MD  memantine (NAMENDA) 10 MG tablet TAKE 1 TABLET (10 MG TOTAL) BY MOUTH 2 (TWO) TIMES DAILY. 05/02/15   Carmen Dohmeier, MD  MYRBETRIQ 50 MG TB24 tablet Take 50 mg by mouth daily. 05/07/15   Historical Provider, MD  oxycodone (OXY-IR) 5 MG capsule Take 5 mg by mouth every 4 (four) hours as needed for pain.    Historical Provider, MD  polyethylene glycol (MIRALAX / GLYCOLAX) packet Take 17 g by mouth 2 (two) times daily. 07/15/15   Belkys A Regalado, MD  predniSONE (DELTASONE) 5 MG tablet Take 5 mg by mouth daily.    Historical Provider, MD  sodium phosphate (FLEET) enema Place 1 enema rectally daily as needed (constipation). follow package directions    Historical Provider, MD  tamsulosin (FLOMAX) 0.4 MG CAPS capsule Take 1 capsule (0.4 mg total) by mouth daily. 07/15/15   Belkys A Regalado, MD  Vitamin D, Ergocalciferol, (DRISDOL) 50000 UNITS CAPS capsule Take 1 capsule by mouth once a week. 07/23/13   Historical Provider, MD    Family History No family history on file.  Social History Social History  Substance Use Topics  . Smoking status: Former Smoker    Quit date: 01/16/2005  . Smokeless tobacco: Never Used  . Alcohol use No     Allergies   Amoxicillin; Cefaclor; Hydrocodone; and Sulfa antibiotics   Review of Systems Review of Systems UNABLE TO OBTAIN ROS DUE TO LEVEL 5 CAVEAT  Physical Exam Updated Vital Signs BP 121/55   Pulse 66   Temp 98.9 F (37.2 C)   Resp 16   SpO2 94%  Vitals reviewed Physical Exam Physical Examination: General appearance - alert, well appearing, and in no distress Mental status - alert, oriented to person, place, not to time Eyes - no conjunctival injection no scleral icterus Mouth - mucous membranes moist, pharynx normal  without lesions Chest - clear to auscultation, no wheezes, rales or rhonchi, symmetric air entry Heart - normal rate, regular rhythm, normal S1, S2, no murmurs, rubs, clicks or gallops Abdomen - soft, nontender, nondistended, no masses or organomegaly Neurological - alert, oriented, normal speech Extremities - peripheral pulses normal, no pedal edema, no clubbing or cyanosis Skin - normal coloration and turgor, no rashes  ED Treatments / Results  Labs (all labs ordered are listed, but only abnormal results are displayed) Labs Reviewed  URINALYSIS, ROUTINE W REFLEX MICROSCOPIC (NOT AT [facility]) - Abnormal; Notable for the following:       Result Value   APPearance CLOUDY (*)    Glucose, UA  250 (*)    Leukocytes, UA MODERATE (*)    All other components within normal limits  CBC - Abnormal; Notable for the following:    RBC 5.21 (*)    Hemoglobin 15.1 (*)    All other components within normal limits  BASIC METABOLIC PANEL - Abnormal; Notable for the following:    Glucose, Bld 371 (*)    All other components within normal limits  URINE MICROSCOPIC-ADD ON - Abnormal; Notable for the following:    Squamous Epithelial / LPF 0-5 (*)    Bacteria, UA MANY (*)    All other components within normal limits  CBG MONITORING, ED - Abnormal; Notable for the following:    Glucose-Capillary 275 (*)    All other components within normal limits  CBG MONITORING, ED - Abnormal; Notable for the following:    Glucose-Capillary 231 (*)    All other components within normal limits  URINE CULTURE    EKG  EKG Interpretation None       Radiology No results found.  Procedures Procedures (including critical care time)  Medications Ordered in ED Medications  sodium chloride 0.9 % bolus 1,000 mL (0 mLs Intravenous Stopped 10/28/15 2244)  cefTRIAXone (ROCEPHIN) 1 g in dextrose 5 % 50 mL IVPB (0 g Intravenous Stopped 10/28/15 2244)  insulin aspart protamine- aspart (NOVOLOG MIX 70/30) injection 5 Units  (5 Units Subcutaneous Given 10/28/15 2300)     Initial Impression / Assessment and Plan / ED Course  I have reviewed the triage vital signs and the nursing notes.  Pertinent labs & imaging results that were available during my care of the patient were reviewed by me and considered in my medical decision making (see chart for details).  Clinical Course   11:32 PM family is very anxious to be discharged, awaiting repeat CBG and then plan for discharge.   Pt with indwelling foley that was just changed prior to arrival.  Urine shows posive nitrite and family states her symptoms are similar when she has UTI.  Pt started on rocephin, will discharge home with keflex.  She did not have any reaction to the rocephin so I feel it is reasonable for her to take keflex at home.  She had n/v with claforan in the past- but feel keflex is a safer choice for her.  Urine culture pending.  Her hyperglycemia was treated in the ED with IV fluids and insulin and improving at time of discharge, family very anxiously awaiting discharge to home.  Discharged with strict return precautions.  Pt agreeable with plan.  Final Clinical Impressions(s) / ED Diagnoses   Final diagnoses:  UTI (lower urinary tract infection)  Hyperglycemia    New Prescriptions Discharge Medication List as of 10/28/2015 11:44 PM    START taking these medications   Details  cephALEXin (KEFLEX) 500 MG capsule Take 1 capsule (500 mg total) by mouth 4 (four) times daily., Starting Mon 10/28/2015, Print         Alfonzo Beers, MD 11/01/15 [phone]

## 2015-10-30 ENCOUNTER — Telehealth: Payer: Self-pay | Admitting: Diagnostic Neuroimaging

## 2015-10-30 NOTE — Telephone Encounter (Signed)
Spoke with husband who stated his wife was taken to ED per PCP office's advice, on Monday. She was diagnosed with UTI, recieved an infusion of medication, and was sent home to take antibiotics x 10 days. Husband stated she has been taking her antibiotics. He has been unable to check her blood sugar because she has been uncooperative. He is asking for "something to control her agitation". Advised that if she is still being uncooperative, and he is unable to check her blood sugar, he needs to call her PCP to be seen today or take her back to the hospital. He restated she needs to "be controled". Advised him that her current issues of UTI and unknown blood sugar is what needs to be taken care of today. Informed him that Dr Brett Fairy is out of the office today, so his wife wouldn't be able to be seen by her today. However this RN offered to let him talk to Dr Anthoney Harada RN.  He stated he wanted her taken care of today. This RN advised again, that she needs to see her PCP, another provider in PCP's office to be taken back to the hospital to be seen today to address her UTI, blod sugar and continuing behavior issues. He verbalized understanding and ended the call.

## 2015-10-30 NOTE — Telephone Encounter (Signed)
Pt's husband called said the pt went to the hospital on Monday night and was dx with UTI and is taking medication for it. He said she was doing well yesterday but today when they went to get her up today he said he is very agitated, uncooperative and biligerant. He said she has never acted this way after being treated for UTI. He said she is not mobil bc she broke her hip in May. She is saying she can take care of herself and does not need any help. He said she is diabetic and her sugar has been running high. He has tried to check it this morning but has been unable to. She also has dementia. Please call him asap.

## 2015-10-30 NOTE — Telephone Encounter (Signed)
Patient having agitation in setting of UTI + dementia. Needs to have UTI treated first. Regarding UTI and blood sugar mgmt, pt and caregiver should discuss with PCP. -VRP

## 2015-10-30 NOTE — Telephone Encounter (Signed)
I spent over 10 mins speaking with pt's husband. Pt's husband reports that pt has a UTI and is taking an abx but is only "6 pills into the 40 pills". She is having episodes of extreme agitation and confusion. He is wondering if a medication could be given to her to help calm her down when she is having the severe agitation spells. We discussed that agitation/confusion may be related to the UTI or it could be from pt's dementia. He is also wondering if our office would agree with her taking mirtazepine for sleep as prescribed by Dr. Thompson Caul office. I advised her that Dr. Brett Fairy is out of the office today and I would have to send this request to another physician if he would like this addressed today. Pt's husband says that he would like the recommendation for prn agitation meds and thoughts on pt's mirtazepine addressed today-will send to [facility].  Of note, pt's husband is agreeable to seeing Jinny Blossom, NP next week for evaluation of worsening dementia. An appt was scheduled for Tuesday with pt's husband.  Also of note, pt's husband reports that the blood sugar monitor keeps giving him an error when he tries to take pt's blood sugar. I advised him of the importance of checking pt's blood sugar and recommended that he take the pt to her PCP office and use the blood sugar machine there. Pt's husband says that he will call his home health nurse and find out if they can come check her sugar, if not, he will take her to the PCP office to have it checked.

## 2015-10-31 ENCOUNTER — Ambulatory Visit (INDEPENDENT_AMBULATORY_CARE_PROVIDER_SITE_OTHER): Payer: Medicare Other | Admitting: Neurology

## 2015-10-31 ENCOUNTER — Encounter: Payer: Self-pay | Admitting: Neurology

## 2015-10-31 VITALS — BP 98/50 | HR 80 | Resp 20

## 2015-10-31 DIAGNOSIS — L89613 Pressure ulcer of right heel, stage 3: Secondary | ICD-10-CM | POA: Diagnosis not present

## 2015-10-31 DIAGNOSIS — F0150 Vascular dementia without behavioral disturbance: Secondary | ICD-10-CM

## 2015-10-31 DIAGNOSIS — L89623 Pressure ulcer of left heel, stage 3: Secondary | ICD-10-CM | POA: Diagnosis not present

## 2015-10-31 NOTE — Telephone Encounter (Signed)
I spoke to pt's husband. Dr. Brett Fairy has an appt today at 11:30am if he wants to come in and discuss prn agitation meds/mirtazapine with her. Pt's husband is agreeable to this appt and will bring her to the 11:30am appt today.

## 2015-10-31 NOTE — Progress Notes (Signed)
PATIENT: Marcia [name] DOB: Aug 23, 1934  REASON FOR VISIT: follow up-  memory HISTORY FROM: patient  HISTORY OF PRESENT ILLNESS:  05-23-2015  Marcia [name] is a 81 year old female with a history of dementia.She continues to take Aricept  23 mg and Namenda and tolerates these medications well. She is able to complete all ADLs independently.  Although husband reports that she does require some prompting. She does not operate a motor vehicle.   She no longer cooks. Family did have a physical therapist coming in to work with the patient however the husband did not find this beneficial for her. She continues to have some difficulty with stability and balance. Husband states he notices it the most when she is trying to get in and out of a car.  The husband reports that she does have some aggressiveness. However he has learned not to respond to this and the patient will calm down. He does feel that it would be beneficial to have an aide in the home. He states that she tends to forget to put on things like her underwear.   Rachel Bo also reports that she tends to have urinary incontinence in the mornings. Helped by [employer] . He states that he tries to prompt her to go to the bathroom as soon as she gets up but the patient does not  Like to be told what to do therefore she is very resistant. She sleeps a lot, she talks to the dog, " DJ ",  a lot.  However the patient is very resistant to having someone in the home to help her.  HISTORY  04/19/14: Marcia [name] is a 81 year old female with a history of dementia. She returns today for follow-up. She is currently taking Aricept and Namenda and tolerating them well. She continues to be on a complete all ADLs independently. However her husband states that she will get very agitated with him if he recommends something that she should wear. He states that she also takes very long time getting ready which tends to make them late for things. The patient no longer operates a motor  vehicle. The patient is no longer cooking meals. Reports that the patient has a hard time operating the TV remote. Therefore she usually watches 1 channel all day long. He states for this reason she does not keep up with current events or the date. Husband feels that her memory has remained the same however her attitude has changed. He denies any physical or verbally aggressive behavior. However he states that she is very stubborn and does not want assistance with certain things. The husband does have a personal trainer coming in twice a week for 2 hours to help the patient with strength in the lower extremities. He does state that the patient had a fall yesterday while he was at work. He is unsure where or how the patient fell. She sustained any significant injuries other than lacerations on the knee and bruising. No new neurological complaints. No new medical issues since last seen.  REVIEW OF SYSTEMS: Out of a complete 14 system review of symptoms, the patient complains only of the following symptoms, and all other reviewed systems are negative.   incontinence of bladder  MMSE - Mini Mental State Exam 10/31/2015 05/23/2015 10/24/2014  Orientation to time 0 0 0  Orientation to Place 1 1 0  Registration 3 0 3  Attention/ Calculation 0 0 2  Recall 0 0 0  Language- name 2 objects 0 1  2  Language- repeat 0 1 1  Language- follow 3 step command '3 3 2  '$ Language- read & follow direction '1 1 1  '$ Write a sentence 1 1 0  Copy design 0 0 1  Total score '9 8 12     '$ ALLERGIES: Allergies  Allergen Reactions  . Amoxicillin Nausea And Vomiting  . Cefaclor Nausea And Vomiting  . Hydrocodone Nausea And Vomiting  . Sulfa Antibiotics     " legs give out"    HOME MEDICATIONS: Outpatient Medications Prior to Visit  Medication Sig Dispense Refill  . amLODipine (NORVASC) 10 MG tablet Take 1 tablet (10 mg total) by mouth daily. 30 tablet 0  . aspirin EC 81 MG tablet Take 81 mg by mouth daily.    Marland Kitchen atorvastatin  (LIPITOR) 40 MG tablet Take 40 mg by mouth daily.    . Calcium Carbonate-Vitamin D (CALCIUM-VITAMIN D) 500-200 MG-UNIT tablet Take 1 tablet by mouth 2 (two) times daily.    . cephALEXin (KEFLEX) 500 MG capsule Take 1 capsule (500 mg total) by mouth 4 (four) times daily. 40 capsule 0  . donepezil (ARICEPT) 23 MG TABS tablet TAKE 1 TABLET (23 MG TOTAL) BY MOUTH AT BEDTIME. 30 tablet 5  . ferrous sulfate 325 (65 FE) MG tablet Take 1 tablet (325 mg total) by mouth 2 (two) times daily with a meal. 30 tablet 3  . insulin lispro (HUMALOG) 100 UNIT/ML injection Inject 3 Units into the skin 3 (three) times daily with meals.     . memantine (NAMENDA) 10 MG tablet TAKE 1 TABLET (10 MG TOTAL) BY MOUTH 2 (TWO) TIMES DAILY. 60 tablet 3  . mirtazapine (REMERON) 7.5 MG tablet Take 7.5 mg by mouth at bedtime.    . polyethylene glycol (MIRALAX / GLYCOLAX) packet Take 17 g by mouth 2 (two) times daily. 14 each 0  . predniSONE (DELTASONE) 5 MG tablet Take 5 mg by mouth daily.    . sodium phosphate (FLEET) enema Place 1 enema rectally daily as needed (constipation). follow package directions    . tamsulosin (FLOMAX) 0.4 MG CAPS capsule Take 1 capsule (0.4 mg total) by mouth daily. 30 capsule 0  . Vitamin D, Ergocalciferol, (DRISDOL) 50000 UNITS CAPS capsule Take 1 capsule by mouth once a week.    . ezetimibe (ZETIA) 10 MG tablet Take 10 mg by mouth daily.    . ciprofloxacin (CIPRO) 500 MG tablet One po bid x 10 days 20 tablet 0  . insulin glargine (LANTUS) 100 UNIT/ML injection Inject 0.15 mLs (15 Units total) into the skin daily. 10 mL 11  . insulin glargine (LANTUS) 100 UNIT/ML injection Inject 0.1 mLs (10 Units total) into the skin at bedtime. 10 mL 11  . MYRBETRIQ 50 MG TB24 tablet Take 50 mg by mouth daily.  3  . oxycodone (OXY-IR) 5 MG capsule Take 5 mg by mouth every 4 (four) hours as needed for pain.     No facility-administered medications prior to visit.     PAST MEDICAL HISTORY: Past Medical History:    Diagnosis Date  . Cancer ([place])   . 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 Colonic polyp   . Diabetes mellitus   . Goiter    with hypothyrodism  . Hyperlipemia   . Lymph node disorder 10R,11R   excision  . Memory loss   . Neuropathy ([facility])    peripheral  . Osteopenia   . Rheumatoid arthritis(714.0)   . Wears glasses  reading    PAST SURGICAL HISTORY: Past Surgical History:  Procedure Laterality Date  . CATARACT EXTRACTION Bilateral   . CESAREAN SECTION    . FOOT SURGERY Right    x3  . LESION REMOVAL Right 09/13/2013   Procedure: EXCISION OF RIGHT LEG SQUAMOUS CELL CARCINOMA/RIGHT ARM LESION WITH ACELL;  Surgeon: Theodoro Kos, DO;  Location: [facility];  Service: Plastics;  Laterality: Right;  . LUNG SURGERY Right 2006   recurrance 2009,lower lobe superior segment,resection:2007  . LUNG SURGERY Right    secondary lower lobe margin, excision,no tumor indentified  . WRIST SURGERY Right     FAMILY HISTORY: No family history on file.  SOCIAL HISTORY: Social History   Social History  . Marital status: Married    Spouse name: Wynetta Emery  . Number of children: 3  . Years of education: College   Occupational History  . Not on file.   Social History Main Topics  . Smoking status: Former Smoker    Quit date: 01/16/2005  . Smokeless tobacco: Never Used  . Alcohol use No  . Drug use: No  . Sexual activity: Yes    Birth control/ protection: None   Other Topics Concern  . Not on file   Social History Narrative   Pt lives at home with her spouse Wynetta Emery)   Patient has three children.   Patient is retired.   Patient has a college education.   Patient is right-handed.   Caffeine Use-2 cups daily      PHYSICAL EXAM  Vitals:   10/31/15 1152  BP: (!) 98/50  Pulse: 80  Resp: 20   There is no height or weight on file to calculate BMI.   MMSE - Mini Mental State Exam 10/31/2015 05/23/2015 10/24/2014 10/24/2014 04/19/2014  Orientation to time 0 0 0 0 0   Orientation to Place 1 1 0 0 1  Registration 3 0 '3 3 3  '$ Attention/ Calculation 0 0 '2 2 3  '$ Recall 0 0 0 0 0  Language- name 2 objects 0 '1 2 2 2  '$ Language- repeat 0 '1 1 1 '$ 0  Language- follow 3 step command '3 3 2 3 3  '$ Language- read & follow direction '1 1 1 1 1  '$ Write a sentence 1 1 0 0 1  Copy design 0 0 1 1 0  Total score '9 8 12 13 14        '$ Generalized: Well developed, in no acute distress   Neurological examination  Mentation: Alert. Follows all commands speech and language fluent. Cranial nerve II-XII:  Extraocular movements were full, visual field were full on confrontational test. Facial sensation and strength were normal. Uvula tongue midline. Head turning and shoulder shrug  were normal and symmetric. Motor: The motor testing reveals 5 over 5 strength of all 4 extremities. Good symmetric motor tone is noted throughout.  Sensory: Sensory testing is intact to soft touch on all 4 extremities. No evidence of extinction is noted.  Coordination: Cerebellar testing reveals good finger-nose-finger and heel-to-shin bilaterally.  Gait and station:  Patient  Requires assistance when standing from a sitting position. Patient's gait is slightly unsteady. Tandem gait unsteady. Romberg is negative.  Reflexes: Deep tendon reflexes are symmetric and normal bilaterally.   DIAGNOSTIC DATA (LABS, IMAGING, TESTING) - I reviewed patient records, labs, notes, testing and imaging myself where available.  Lab Results  Component Value Date   WBC 6.3 10/28/2015   HGB 15.1 (H) 10/28/2015   HCT  44.8 10/28/2015   MCV 86.0 10/28/2015   PLT 169 10/28/2015      Component Value Date/Time   NA 137 10/28/2015 1847   NA 141 04/09/2015 0934   K 4.0 10/28/2015 1847   K 4.4 04/09/2015 0934   CL 101 10/28/2015 1847   CL 99 04/04/2012 0921   CO2 27 10/28/2015 1847   CO2 30 (H) 04/09/2015 0934   GLUCOSE 371 (H) 10/28/2015 1847   GLUCOSE 424 (H) 04/09/2015 0934   GLUCOSE 275 (H) 04/04/2012 0921   BUN  13 10/28/2015 1847   BUN 19.2 04/09/2015 0934   CREATININE 0.77 10/28/2015 1847   CREATININE 1.0 04/09/2015 0934   CALCIUM 9.1 10/28/2015 1847   CALCIUM 9.5 04/09/2015 0934   PROT 6.0 (L) 07/11/2015 1519   PROT 6.4 04/09/2015 0934   ALBUMIN 2.9 (L) 07/11/2015 1519   ALBUMIN 3.4 (L) 04/09/2015 0934   AST 21 07/11/2015 1519   AST 33 04/09/2015 0934   ALT 19 07/11/2015 1519   ALT 60 (H) 04/09/2015 0934   ALKPHOS 94 07/11/2015 1519   ALKPHOS 139 04/09/2015 0934   BILITOT 1.3 (H) 07/11/2015 1519   BILITOT 0.50 04/09/2015 0934   GFRNONAA >60 10/28/2015 1847   GFRAA >60 10/28/2015 1847   No results found for: CHOL, HDL, LDLCALC, LDLDIRECT, TRIG, CHOLHDL Lab Results  Component Value Date   HGBA1C 10.6 (H) 07/12/2015   No results found for: VITAMINB12 No results found for: TSH  MMSE - Mini Mental State Exam 10/31/2015 05/23/2015 10/24/2014  Orientation to time 0 0 0  Orientation to Place 1 1 0  Registration 3 0 3  Attention/ Calculation 0 0 2  Recall 0 0 0  Language- name 2 objects 0 1 2  Language- repeat 0 1 1  Language- follow 3 step command '3 3 2  '$ Language- read & follow direction '1 1 1  '$ Write a sentence 1 1 0  Copy design 0 0 1  Total score '9 8 12     '$ ASSESSMENT AND PLAN 81 y.o. year old female  has a past medical history of Cancer ([place]); Carcinoma ([place]); Colonic polyp; Diabetes mellitus; Goiter; Hyperlipemia; Lymph node disorder (10R,11R); Memory loss; Neuropathy ([place]); Osteopenia; Rheumatoid arthritis(714.0); and Wears glasses. here with:  1.  Progressed Dementia in a patient with advanced microvascular disease , diabetes, declining scores on MMSE for years .   Overall the patient's memory has declined She will continue on Namenda. She will increase Aricept 23 mg daily.  Dementia and immobility since hip fracture in May - this caused her to be bed bound,  Caused skin ulcers. Cannot bare weight. Has lost muscle mass.  Advised the husband that common side effect is GI upset  but this has not affected her.  She is unable to benefit from rehab, did not re-learn to walk. PT stopped/   Mr. Gloriann Loan will pay for a private PT to conteract the muscle loss.  At times Mrs. Sassaman has been agitated or anxious, has tried to get out of bed when she couldn't ambulate and Mr. Purvis Sheffield concern is that she is at a high risk of falling and further injury of course. She is using a urinary catheter, this is permanent. She has significantly lost muscle mass and both lower extremities. She is well groomed. And she is today smiling and conversant. I like to suggest a low-dose of xanax to help as possible agitation , she has not had hallucinations. Dr Tamala Julian colleagues wrote for it.  I agree with 0.5 mg as needed for agitation.  She had a previous lung surgery, mirtazepine failed. Last Monday diagnosed with a UTI- which can cause confusion and agitation.      RV with me or Np in 2 month.    Larey Seat, MD  10/31/2015, 12:42 PM [facility] [address] [place], [place] [address] [phone]

## 2015-10-31 NOTE — Patient Instructions (Signed)
Rv in 2-3 month with PT report.

## 2015-11-01 DIAGNOSIS — E11621 Type 2 diabetes mellitus with foot ulcer: Secondary | ICD-10-CM | POA: Diagnosis not present

## 2015-11-05 ENCOUNTER — Ambulatory Visit: Payer: Medicare Other | Admitting: Adult Health

## 2015-11-06 ENCOUNTER — Other Ambulatory Visit (HOSPITAL_COMMUNITY): Payer: Self-pay | Admitting: Family Medicine

## 2015-11-06 ENCOUNTER — Ambulatory Visit (HOSPITAL_COMMUNITY)
Admission: RE | Admit: 2015-11-06 | Discharge: 2015-11-06 | Disposition: A | Discharge status: Home / Self Care | Payer: Medicare Other | Source: Ambulatory Visit | Attending: Family Medicine | Admitting: Family Medicine

## 2015-11-06 DIAGNOSIS — I739 Peripheral vascular disease, unspecified: Secondary | ICD-10-CM | POA: Insufficient documentation

## 2015-11-06 DIAGNOSIS — M86171 Other acute osteomyelitis, right ankle and foot: Secondary | ICD-10-CM

## 2015-11-06 DIAGNOSIS — M86072 Acute hematogenous osteomyelitis, left ankle and foot: Secondary | ICD-10-CM | POA: Diagnosis not present

## 2015-11-06 DIAGNOSIS — F0281 Dementia in other diseases classified elsewhere with behavioral disturbance: Secondary | ICD-10-CM | POA: Insufficient documentation

## 2015-11-06 DIAGNOSIS — M86272 Subacute osteomyelitis, left ankle and foot: Secondary | ICD-10-CM

## 2015-11-06 DIAGNOSIS — L97412 Non-pressure chronic ulcer of right heel and midfoot with fat layer exposed: Secondary | ICD-10-CM | POA: Diagnosis not present

## 2015-11-06 DIAGNOSIS — M86071 Acute hematogenous osteomyelitis, right ankle and foot: Secondary | ICD-10-CM | POA: Diagnosis not present

## 2015-11-06 DIAGNOSIS — L89623 Pressure ulcer of left heel, stage 3: Secondary | ICD-10-CM | POA: Diagnosis not present

## 2015-11-06 DIAGNOSIS — E11621 Type 2 diabetes mellitus with foot ulcer: Secondary | ICD-10-CM | POA: Diagnosis not present

## 2015-11-06 DIAGNOSIS — M86172 Other acute osteomyelitis, left ankle and foot: Secondary | ICD-10-CM

## 2015-11-06 DIAGNOSIS — L89613 Pressure ulcer of right heel, stage 3: Secondary | ICD-10-CM | POA: Diagnosis not present

## 2015-11-07 ENCOUNTER — Telehealth: Payer: Self-pay | Admitting: Neurology

## 2015-11-07 DIAGNOSIS — L89623 Pressure ulcer of left heel, stage 3: Secondary | ICD-10-CM | POA: Diagnosis not present

## 2015-11-07 DIAGNOSIS — L89613 Pressure ulcer of right heel, stage 3: Secondary | ICD-10-CM | POA: Diagnosis not present

## 2015-11-07 LAB — URINE CULTURE: Culture: >=100000 — AB

## 2015-11-07 MED ORDER — OLANZAPINE 2.5 MG PO TABS: 2.5000 mg | ORAL_TABLET | Freq: Every day | ORAL | 1 refills | Status: DC

## 2015-11-07 NOTE — Telephone Encounter (Signed)
I called pt's husband and advised him that pt may take mirtazepine and xanax together. I also advised him that Dr. Brett Fairy has prescribed zyprexa to take at night as well. Pt's husband began asking a lot of questions about medication interactions and what the medications are for. I transferred the phone call to Dr. Brett Fairy, who took the call.

## 2015-11-07 NOTE — Telephone Encounter (Signed)
I spoke with Marcia [name], he will try low dose Zyprexa tonight, and if needed he can advance to bid use. We decided against Seroquel as the patient is a brittle diabetic. He would like for [place] to help finding additional caretakers, possibly with a nursing background.

## 2015-11-07 NOTE — Telephone Encounter (Signed)
Marcia [name]/ [facility]-states her symptoms are getting worse. She is getting more combative. She would like to discuss other medications and combination of medication as well. Pt is bed bound but is trying to get out of bed. Pt has 24 hr care and they are having trouble handling her. Please call [phone]

## 2015-11-07 NOTE — Telephone Encounter (Signed)
I spoke to [place], Therapist, sports from [place]. Pt has been increasingly combative lately, trying to get out of bed, kicking her husband, trying to walk while in the wheelchair, waking up at night screaming, etc. Pt's husband has been giving pt xanax 0.'25mg'$ -0.'5mg'$  to calm her down, but it is not working.  I spoke to pt's husband. He says that "I would rather have her doped up at home than hanging out of a wheelchair crying at a nursing home." He is wondering if there are any other medications he can try to help her stay calm. Pt does have 24/7 aides at home but he reports that they can't handle her when she gets combative and agitated.  Pt's husband also wants to know if it is ok for pt to take mirtazapine and xanax at the same time before bedtime to help her sleep through the night.  I advised pt's husband and Christa, RN that I would relay these concerns to Dr. Brett Fairy.

## 2015-11-07 NOTE — Telephone Encounter (Addendum)
I called pt's husband to discuss. No answer, left a message asking him to call me back. 

## 2015-11-07 NOTE — Telephone Encounter (Signed)
Yes, she can use it together.  Mirtazepine and Xanax. Could consider Seroquel, but her diabetes may be more  difficult to control.  I will suggest ZYREXA , lowest dose.

## 2015-11-07 NOTE — Telephone Encounter (Signed)
Phone call from [facility] requesting that nurse Cyril Mourning please call [facility] Nurse Arlyss Gandy [phone]. Wife has dementia and "he's had about all he can take", "her behavior has changed dramatically in the last week".

## 2015-11-08 ENCOUNTER — Telehealth (HOSPITAL_BASED_OUTPATIENT_CLINIC_OR_DEPARTMENT_OTHER): Payer: Self-pay

## 2015-11-08 NOTE — Telephone Encounter (Signed)
Post ED Visit - Positive Culture Follow-up  Culture report reviewed by antimicrobial stewardship pharmacist:  '[]'$  Elenor Quinones, Pharm.D. '[]'$  Heide Guile, Pharm.D., BCPS '[]'$  Parks Neptune, Pharm.D. '[]'$  Alycia Rossetti, Pharm.D., BCPS '[]'$  [place], Pharm.D., BCPS, AAHIVP '[]'$  Legrand Como, Pharm.D., BCPS, AAHIVP '[]'$  Milus Glazier, Pharm.D. '[]'$  Stephens November, Pharm.DUvaldo Bristle  Pharm D Positive urine culture Treated with Cephalexin, organism sensitive to the same and no further patient follow-up is required at this time.  Genia Del 11/08/2015, 9:02 AM

## 2015-11-09 ENCOUNTER — Telehealth: Payer: Self-pay | Admitting: Neurology

## 2015-11-09 MED ORDER — HYDROXYZINE HCL 10 MG PO TABS: ORAL_TABLET | ORAL | 0 refills | Status: DC

## 2015-11-09 MED ORDER — HYDROXYZINE HCL 10 MG PO TABS: ORAL_TABLET | ORAL | 1 refills | Status: DC

## 2015-11-09 NOTE — Telephone Encounter (Signed)
Wynetta Emery called that Charlise is still very agitated and not sleeping.   I called in hydroxyzine 10 mg to take one or two pills up to 3 times a day

## 2015-11-09 NOTE — Telephone Encounter (Signed)
I spoke to [place].  She has been having difficulty with sleep and is being abusive.   He does not think the olanzepine has helped much.  I will have her try hydroxyzine instead and will call in to [facility]

## 2015-11-12 NOTE — Telephone Encounter (Signed)
Vistaril seemed to help. Thank You, Richard.

## 2015-11-13 DIAGNOSIS — I739 Peripheral vascular disease, unspecified: Secondary | ICD-10-CM | POA: Diagnosis not present

## 2015-11-13 DIAGNOSIS — E11621 Type 2 diabetes mellitus with foot ulcer: Secondary | ICD-10-CM | POA: Diagnosis not present

## 2015-11-13 DIAGNOSIS — L97522 Non-pressure chronic ulcer of other part of left foot with fat layer exposed: Secondary | ICD-10-CM | POA: Diagnosis not present

## 2015-11-13 DIAGNOSIS — L97412 Non-pressure chronic ulcer of right heel and midfoot with fat layer exposed: Secondary | ICD-10-CM | POA: Diagnosis not present

## 2015-11-14 DIAGNOSIS — L89623 Pressure ulcer of left heel, stage 3: Secondary | ICD-10-CM | POA: Diagnosis not present

## 2015-11-14 DIAGNOSIS — L89613 Pressure ulcer of right heel, stage 3: Secondary | ICD-10-CM | POA: Diagnosis not present

## 2015-11-16 ENCOUNTER — Other Ambulatory Visit: Payer: Self-pay | Admitting: Neurology

## 2015-11-18 ENCOUNTER — Ambulatory Visit (INDEPENDENT_AMBULATORY_CARE_PROVIDER_SITE_OTHER): Payer: Medicare Other | Admitting: Podiatry

## 2015-11-18 VITALS — BP 119/65 | HR 75 | Resp 16

## 2015-11-18 DIAGNOSIS — E0843 Diabetes mellitus due to underlying condition with diabetic autonomic (poly)neuropathy: Secondary | ICD-10-CM | POA: Diagnosis not present

## 2015-11-18 DIAGNOSIS — L603 Nail dystrophy: Secondary | ICD-10-CM | POA: Diagnosis not present

## 2015-11-18 DIAGNOSIS — M79676 Pain in unspecified toe(s): Secondary | ICD-10-CM

## 2015-11-18 DIAGNOSIS — L608 Other nail disorders: Secondary | ICD-10-CM

## 2015-11-18 DIAGNOSIS — B351 Tinea unguium: Secondary | ICD-10-CM | POA: Diagnosis not present

## 2015-11-18 DIAGNOSIS — M79609 Pain in unspecified limb: Principal | ICD-10-CM

## 2015-11-18 NOTE — Patient Instructions (Signed)
Diabetes and Foot Care Diabetes may cause you to have problems because of poor blood supply (circulation) to your feet and legs. This may cause the skin on your feet to become thinner, break easier, and heal more slowly. Your skin may become dry, and the skin may peel and crack. You may also have nerve damage in your legs and feet causing decreased feeling in them. You may not notice minor injuries to your feet that could lead to infections or more serious problems. Taking care of your feet is one of the most important things you can do for yourself.  HOME CARE INSTRUCTIONS  Wear shoes at all times, even in the house. Do not go barefoot. Bare feet are easily injured.  Check your feet daily for blisters, cuts, and redness. If you cannot see the bottom of your feet, use a mirror or ask someone for help.  Wash your feet with warm water (do not use hot water) and mild soap. Then pat your feet and the areas between your toes until they are completely dry. Do not soak your feet as this can dry your skin.  Apply a moisturizing lotion or petroleum jelly (that does not contain alcohol and is unscented) to the skin on your feet and to dry, brittle toenails. Do not apply lotion between your toes.  Trim your toenails straight across. Do not dig under them or around the cuticle. File the edges of your nails with an emery board or nail file.  Do not cut corns or calluses or try to remove them with medicine.  Wear clean socks or stockings every day. Make sure they are not too tight. Do not wear knee-high stockings since they may decrease blood flow to your legs.  Wear shoes that fit properly and have enough cushioning. To break in new shoes, wear them for just a few hours a day. This prevents you from injuring your feet. Always look in your shoes before you put them on to be sure there are no objects inside.  Do not cross your legs. This may decrease the blood flow to your feet.  If you find a minor scrape,  cut, or break in the skin on your feet, keep it and the skin around it clean and dry. These areas may be cleansed with mild soap and water. Do not cleanse the area with peroxide, alcohol, or iodine.  When you remove an adhesive bandage, be sure not to damage the skin around it.  If you have a wound, look at it several times a day to make sure it is healing.  Do not use heating pads or hot water bottles. They may burn your skin. If you have lost feeling in your feet or legs, you may not know it is happening until it is too late.  Make sure your health care provider performs a complete foot exam at least annually or more often if you have foot problems. Report any cuts, sores, or bruises to your health care provider immediately. SEEK MEDICAL CARE IF:   You have an injury that is not healing.  You have cuts or breaks in the skin.  You have an ingrown nail.  You notice redness on your legs or feet.  You feel burning or tingling in your legs or feet.  You have pain or cramps in your legs and feet.  Your legs or feet are numb.  Your feet always feel cold. SEEK IMMEDIATE MEDICAL CARE IF:   There is increasing redness,   swelling, or pain in or around a wound.  There is a red line that goes up your leg.  Pus is coming from a wound.  You develop a fever or as directed by your health care provider.  You notice a bad smell coming from an ulcer or wound.   This information is not intended to replace advice given to you by your health care provider. Make sure you discuss any questions you have with your health care provider.   Document Released: 01/31/2000 Document Revised: 10/05/2012 Document Reviewed: 07/12/2012 Elsevier Interactive Patient Education 2016 [employer]vier Inc[employer]  

## 2015-11-18 NOTE — Progress Notes (Signed)
SUBJECTIVE Patient with a history of diabetes mellitus presents to office today complaining of elongated, thickened nails. Pain while ambulating in shoes. Patient is unable to trim their own nails.   Allergies  Allergen Reactions  . Amoxicillin Nausea And Vomiting  . Cefaclor Nausea And Vomiting  . Hydrocodone Nausea And Vomiting  . Sulfa Antibiotics     " legs give out"    OBJECTIVE General Patient is awake, alert, and oriented x 3 and in no acute distress. Derm Skin is dry and supple bilateral. Negative open lesions or macerations. Remaining integument unremarkable. Nails are tender, long, thickened and dystrophic with subungual debris, consistent with onychomycosis, 1-5 bilateral. No signs of infection noted. Vasc  DP and PT pedal pulses palpable bilaterally. Temperature gradient within normal limits.  Neuro Epicritic and protective threshold sensation diminished bilaterally.  Musculoskeletal Exam No symptomatic pedal deformities noted bilateral. Muscular strength within normal limits.  ASSESSMENT 1. Diabetes Mellitus w/ peripheral neuropathy 2. Onychomycosis of nail due to dermatophyte bilateral 3. Pain in foot bilateral  PLAN OF CARE 1. Patient evaluated today. 2. Instructed to maintain good pedal hygiene and foot care. Stressed importance of controlling blood sugar.  3. Mechanical debridement of nails 1-5 bilaterally performed using a nail nipper. Filed with dremel without incident.  4. Return to clinic in 3 mos.    Edrick Kins, DPM

## 2015-11-20 ENCOUNTER — Encounter (HOSPITAL_BASED_OUTPATIENT_CLINIC_OR_DEPARTMENT_OTHER): Payer: Medicare Other | Attending: Surgery

## 2015-11-20 DIAGNOSIS — S81801A Unspecified open wound, right lower leg, initial encounter: Secondary | ICD-10-CM | POA: Insufficient documentation

## 2015-11-20 DIAGNOSIS — Z85118 Personal history of other malignant neoplasm of bronchus and lung: Secondary | ICD-10-CM | POA: Insufficient documentation

## 2015-11-20 DIAGNOSIS — S51801A Unspecified open wound of right forearm, initial encounter: Secondary | ICD-10-CM | POA: Diagnosis not present

## 2015-11-20 DIAGNOSIS — L97521 Non-pressure chronic ulcer of other part of left foot limited to breakdown of skin: Secondary | ICD-10-CM | POA: Insufficient documentation

## 2015-11-20 DIAGNOSIS — E11621 Type 2 diabetes mellitus with foot ulcer: Secondary | ICD-10-CM | POA: Insufficient documentation

## 2015-11-20 DIAGNOSIS — S81011A Laceration without foreign body, right knee, initial encounter: Secondary | ICD-10-CM | POA: Diagnosis not present

## 2015-11-20 DIAGNOSIS — L89623 Pressure ulcer of left heel, stage 3: Secondary | ICD-10-CM | POA: Diagnosis not present

## 2015-11-20 DIAGNOSIS — S61501A Unspecified open wound of right wrist, initial encounter: Secondary | ICD-10-CM | POA: Insufficient documentation

## 2015-11-20 DIAGNOSIS — L97411 Non-pressure chronic ulcer of right heel and midfoot limited to breakdown of skin: Secondary | ICD-10-CM | POA: Diagnosis not present

## 2015-11-20 DIAGNOSIS — X58XXXA Exposure to other specified factors, initial encounter: Secondary | ICD-10-CM | POA: Diagnosis not present

## 2015-11-20 DIAGNOSIS — Z9221 Personal history of antineoplastic chemotherapy: Secondary | ICD-10-CM | POA: Diagnosis not present

## 2015-11-20 DIAGNOSIS — Z85828 Personal history of other malignant neoplasm of skin: Secondary | ICD-10-CM | POA: Diagnosis not present

## 2015-11-20 DIAGNOSIS — E1151 Type 2 diabetes mellitus with diabetic peripheral angiopathy without gangrene: Secondary | ICD-10-CM | POA: Diagnosis not present

## 2015-11-20 DIAGNOSIS — M069 Rheumatoid arthritis, unspecified: Secondary | ICD-10-CM | POA: Insufficient documentation

## 2015-11-20 DIAGNOSIS — L84 Corns and callosities: Secondary | ICD-10-CM | POA: Diagnosis not present

## 2015-11-20 DIAGNOSIS — S81001A Unspecified open wound, right knee, initial encounter: Secondary | ICD-10-CM | POA: Insufficient documentation

## 2015-11-20 DIAGNOSIS — F0390 Unspecified dementia without behavioral disturbance: Secondary | ICD-10-CM | POA: Insufficient documentation

## 2015-11-20 DIAGNOSIS — E1140 Type 2 diabetes mellitus with diabetic neuropathy, unspecified: Secondary | ICD-10-CM | POA: Insufficient documentation

## 2015-11-21 ENCOUNTER — Encounter: Payer: Self-pay | Admitting: Adult Health

## 2015-11-21 ENCOUNTER — Telehealth: Payer: Self-pay | Admitting: Adult Health

## 2015-11-21 ENCOUNTER — Ambulatory Visit (INDEPENDENT_AMBULATORY_CARE_PROVIDER_SITE_OTHER): Payer: Medicare Other | Admitting: Adult Health

## 2015-11-21 ENCOUNTER — Telehealth: Payer: Self-pay | Admitting: *Deleted

## 2015-11-21 VITALS — BP 117/64 | HR 78 | Ht 65.0 in

## 2015-11-21 DIAGNOSIS — L89613 Pressure ulcer of right heel, stage 3: Secondary | ICD-10-CM | POA: Diagnosis not present

## 2015-11-21 DIAGNOSIS — L89623 Pressure ulcer of left heel, stage 3: Secondary | ICD-10-CM | POA: Diagnosis not present

## 2015-11-21 DIAGNOSIS — F0391 Unspecified dementia with behavioral disturbance: Secondary | ICD-10-CM

## 2015-11-21 DIAGNOSIS — R451 Restlessness and agitation: Secondary | ICD-10-CM | POA: Diagnosis not present

## 2015-11-21 LAB — SUSCEPTIBILITY, AER + ANAEROB

## 2015-11-21 LAB — SUSCEPTIBILITY RESULT

## 2015-11-21 MED ORDER — ALPRAZOLAM 0.25 MG PO TABS: 0.2500 mg | ORAL_TABLET | Freq: Three times a day (TID) | ORAL | 5 refills | Status: DC | PRN

## 2015-11-21 MED ORDER — LORAZEPAM 0.5 MG PO TABS: 0.5000 mg | ORAL_TABLET | Freq: Two times a day (BID) | ORAL | 5 refills | Status: AC | PRN

## 2015-11-21 NOTE — Telephone Encounter (Signed)
Pt's husband called wanted to know how long will take for the ativan to take affect once he gives it to her.

## 2015-11-21 NOTE — Patient Instructions (Addendum)
Stop vistaril and zyprexa.  Use xanax 0.25 mg three times a day if needed for agitation Referral to Dr. Casimiro Needle  If your symptoms worsen or you develop new symptoms please let us know.

## 2015-11-21 NOTE — Progress Notes (Signed)
I agree with the assessment and plan as directed by NP .The patient is known to me .   Yair Dusza, MD  

## 2015-11-21 NOTE — Progress Notes (Signed)
PATIENT: Marcia [name] DOB: 1934-08-06   REASON FOR VISIT: follow up- dementia HISTORY FROM: patient  HISTORY OF PRESENT ILLNESS: [name] is a 81 year old female with a history of dementia. She returns today for follow-up. She remains on Aricept and Namenda for her memory. In the past she was getting prescription of Zyprexa to help with her behavior however the husband reports this is not beneficial. She was then started on Vistaril and improvement was noted however is not consistent. He reports that she is still agitated throughout the day. She is not violent but is persistent. He also reports that she does not sleep well most nights. He states that she goes to bed but is usually up and down all night. Denies hallucinations. He also reports they sometimes have trouble  completing activities of daily living. She sometimes is resistant to being told what to do. She does have aides that her with her 24/7. A that is with her today states that they have tried distraction techniques however the patient does not respond to these. She returns today for an evaluation.   HISTORY per Dr. Edwena Felty notes 05-23-2015  [name] is a 81 year old female with a history of dementia.She continues to take Aricept  23 mg and Namenda and tolerates these medications well. She is able to complete all ADLs independently.  Although husband reports that she does require some prompting. She does not operate a motor vehicle.   She no longer cooks. Family did have a physical therapist coming in to work with the patient however the husband did not find this beneficial for her. She continues to have some difficulty with stability and balance. Husband states he notices it the most when she is trying to get in and out of a car.  The husband reports that she does have some aggressiveness. However he has learned not to respond to this and the patient will calm down. He does feel that it would be beneficial to have an aide in the  home. He states that she tends to forget to put on things like her underwear.   Rachel Bo also reports that she tends to have urinary incontinence in the mornings. Helped by [employer] . He states that he tries to prompt her to go to the bathroom as soon as she gets up but the patient does not  Like to be told what to do therefore she is very resistant. She sleeps a lot, she talks to the dog, " DJ ",  a lot.  However the patient is very resistant to having someone in the home to help her.  HISTORY  04/19/14: [name] is a 81 year old female with a history of dementia. She returns today for follow-up. She is currently taking Aricept and Namenda and tolerating them well. She continues to be on a complete all ADLs independently. However her husband states that she will get very agitated with him if he recommends something that she should wear. He states that she also takes very long time getting ready which tends to make them late for things. The patient no longer operates a motor vehicle. The patient is no longer cooking meals. Reports that the patient has a hard time operating the TV remote. Therefore she usually watches 1 channel all day long. He states for this reason she does not keep up with current events or the date. Husband feels that her memory has remained the same however her attitude has changed. He denies any physical or verbally  aggressive behavior. However he states that she is very stubborn and does not want assistance with certain things. The husband does have a personal trainer coming in twice a week for 2 hours to help the patient with strength in the lower extremities. He does state that the patient had a fall yesterday while he was at work. He is unsure where or how the patient fell. She sustained any significant injuries other than lacerations on the knee and bruising. No new neurological complaints. No new medical issues since last seen.  REVIEW OF SYSTEMS: Out of a complete 14 system review  of symptoms, the patient complains only of the following symptoms, and all other reviewed systems are negative.  See history of present illness  ALLERGIES: Allergies  Allergen Reactions  . Amoxicillin Nausea And Vomiting  . Cefaclor Nausea And Vomiting  . Hydrocodone Nausea And Vomiting  . Sulfa Antibiotics     " legs give out"    HOME MEDICATIONS: Outpatient Medications Prior to Visit  Medication Sig Dispense Refill  . ALPRAZolam (XANAX) 0.5 MG tablet Take 0.5 mg by mouth daily as needed.    Marland Kitchen amLODipine (NORVASC) 10 MG tablet Take 1 tablet (10 mg total) by mouth daily. 30 tablet 0  . aspirin EC 81 MG tablet Take 81 mg by mouth daily.    Marland Kitchen atorvastatin (LIPITOR) 40 MG tablet Take 40 mg by mouth daily.    . Calcium Carbonate-Vitamin D (CALCIUM-VITAMIN D) 500-200 MG-UNIT tablet Take 1 tablet by mouth 2 (two) times daily.    . cephALEXin (KEFLEX) 500 MG capsule Take 1 capsule (500 mg total) by mouth 4 (four) times daily. (Patient not taking: Reported on 11/18/2015) 40 capsule 0  . donepezil (ARICEPT) 23 MG TABS tablet TAKE 1 TABLET (23 MG TOTAL) BY MOUTH AT BEDTIME. 30 tablet 5  . ezetimibe (ZETIA) 10 MG tablet Take 10 mg by mouth daily.    . ferrous sulfate 325 (65 FE) MG tablet Take 1 tablet (325 mg total) by mouth 2 (two) times daily with a meal. 30 tablet 3  . hydrOXYzine (ATARAX/VISTARIL) 10 MG tablet Take one or two tablets up to 3 times a day for agitation and at bedtime 100 tablet 1  . insulin lispro (HUMALOG) 100 UNIT/ML injection Inject 3 Units into the skin 3 (three) times daily with meals.     . memantine (NAMENDA) 10 MG tablet TAKE 1 TABLET (10 MG TOTAL) BY MOUTH 2 (TWO) TIMES DAILY. 60 tablet 3  . mirtazapine (REMERON) 7.5 MG tablet Take 7.5 mg by mouth at bedtime.    Marland Kitchen OLANZapine (ZYPREXA) 2.5 MG tablet Take 1 tablet (2.5 mg total) by mouth at bedtime. 30 tablet 1  . polyethylene glycol (MIRALAX / GLYCOLAX) packet Take 17 g by mouth 2 (two) times daily. 14 each 0  .  predniSONE (DELTASONE) 5 MG tablet Take 5 mg by mouth daily.    . sodium phosphate (FLEET) enema Place 1 enema rectally daily as needed (constipation). follow package directions    . tamsulosin (FLOMAX) 0.4 MG CAPS capsule Take 1 capsule (0.4 mg total) by mouth daily. 30 capsule 0  . Vitamin D, Ergocalciferol, (DRISDOL) 50000 UNITS CAPS capsule Take 1 capsule by mouth once a week.     No facility-administered medications prior to visit.     PAST MEDICAL HISTORY: Past Medical History:  Diagnosis Date  . Cancer ([facility])   . 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 Colonic polyp   . Diabetes  mellitus   . Goiter    with hypothyrodism  . Hyperlipemia   . Lymph node disorder 10R,11R   excision  . Memory loss   . Neuropathy ([facility])    peripheral  . Osteopenia   . Rheumatoid arthritis(714.0)   . Wears glasses    reading    PAST SURGICAL HISTORY: Past Surgical History:  Procedure Laterality Date  . CATARACT EXTRACTION Bilateral   . CESAREAN SECTION    . FOOT SURGERY Right    x3  . LESION REMOVAL Right 09/13/2013   Procedure: EXCISION OF RIGHT LEG SQUAMOUS CELL CARCINOMA/RIGHT ARM LESION WITH ACELL;  Surgeon: Theodoro Kos, DO;  Location: [facility];  Service: Plastics;  Laterality: Right;  . LUNG SURGERY Right 2006   recurrance 2009,lower lobe superior segment,resection:2007  . LUNG SURGERY Right    secondary lower lobe margin, excision,no tumor indentified  . WRIST SURGERY Right     FAMILY HISTORY: No family history on file.  SOCIAL HISTORY: Social History   Social History  . Marital status: Married    Spouse name: Wynetta Emery  . Number of children: 3  . Years of education: College   Occupational History  . Not on file.   Social History Main Topics  . Smoking status: Former Smoker    Quit date: 01/16/2005  . Smokeless tobacco: Never Used  . Alcohol use No  . Drug use: No  . Sexual activity: Yes    Birth control/ protection: None   Other Topics Concern    . Not on file   Social History Narrative   Pt lives at home with her spouse Wynetta Emery)   Patient has three children.   Patient is retired.   Patient has a college education.   Patient is right-handed.   Caffeine Use-2 cups daily      PHYSICAL EXAM  Vitals:   11/21/15 1112  BP: 117/64  Pulse: 78  Height: '5\' 5"'$  (1.651 m)   There is no height or weight on file to calculate BMI.   MMSE - Mini Mental State Exam 11/21/2015 10/31/2015 05/23/2015  Orientation to time 0 0 0  Orientation to Place '1 1 1  '$ Registration 3 3 0  Attention/ Calculation 0 0 0  Recall 0 0 0  Language- name 2 objects 1 0 1  Language- repeat 0 0 1  Language- follow 3 step command '2 3 3  '$ Language- read & follow direction '1 1 1  '$ Write a sentence 0 1 1  Copy design 0 0 0  Total score '8 9 8     '$ Generalized: Well developed, in no acute distress   Neurological examination  Mentation: Alert.. Follows all commands speech and language fluent Cranial nerve II-XII: Pupils were equal round reactive to light. Extraocular movements were full, visual field were full on confrontational test. Facial sensation and strength were normal. Uvula tongue midline. Head turning and shoulder shrug  were normal and symmetric. Motor: The motor testing reveals 5 over 5 strength of all 4 extremities. Good symmetric motor tone is noted throughout.  Sensory: Sensory testing is intact to soft touch on all 4 extremities. No evidence of extinction is noted.  Coordination: Cerebellar testing reveals good finger-nose-finger and difficulty with heel-to-shin bilaterally due to mobility. Gait and station: In a wheelchair.  Reflexes: Deep tendon reflexes are symmetric and normal bilaterally.   DIAGNOSTIC DATA (LABS, IMAGING, TESTING) - I reviewed patient records, labs, notes, testing and imaging myself where available.  Lab Results  Component Value Date   WBC 6.3 10/28/2015   HGB 15.1 (H) 10/28/2015   HCT 44.8 10/28/2015   MCV 86.0  10/28/2015   PLT 169 10/28/2015      Component Value Date/Time   NA 137 10/28/2015 1847   NA 141 04/09/2015 0934   K 4.0 10/28/2015 1847   K 4.4 04/09/2015 0934   CL 101 10/28/2015 1847   CL 99 04/04/2012 0921   CO2 27 10/28/2015 1847   CO2 30 (H) 04/09/2015 0934   GLUCOSE 371 (H) 10/28/2015 1847   GLUCOSE 424 (H) 04/09/2015 0934   GLUCOSE 275 (H) 04/04/2012 0921   BUN 13 10/28/2015 1847   BUN 19.2 04/09/2015 0934   CREATININE 0.77 10/28/2015 1847   CREATININE 1.0 04/09/2015 0934   CALCIUM 9.1 10/28/2015 1847   CALCIUM 9.5 04/09/2015 0934   PROT 6.0 (L) 07/11/2015 1519   PROT 6.4 04/09/2015 0934   ALBUMIN 2.9 (L) 07/11/2015 1519   ALBUMIN 3.4 (L) 04/09/2015 0934   AST 21 07/11/2015 1519   AST 33 04/09/2015 0934   ALT 19 07/11/2015 1519   ALT 60 (H) 04/09/2015 0934   ALKPHOS 94 07/11/2015 1519   ALKPHOS 139 04/09/2015 0934   BILITOT 1.3 (H) 07/11/2015 1519   BILITOT 0.50 04/09/2015 0934   GFRNONAA >60 10/28/2015 1847   GFRAA >60 10/28/2015 1847      ASSESSMENT AND PLAN 81 y.o. year old female  has a past medical history of Cancer ([place]); Carcinoma ([facility]); Colonic polyp; Diabetes mellitus; Goiter; Hyperlipemia; Lymph node disorder (10R,11R); Memory loss; Neuropathy ([place]); Osteopenia; Rheumatoid arthritis(714.0); and Wears glasses. here with:  1. Dementia with behavioral disturbance  The patient continues to be agitated and restlessness during the day and night. I called and spoke with Dr. Casimiro Needle regarding treatment options for this patient. He recommended that we stop Zyprexa and Xanax. Start the patient on Ativan 0.5 mg twice a day. I have reviewed this with husband and he is amenable to this plan. For now we will also stop Vistaril as he has not seen any benefit with this medication. I will refer the patient to Dr. Casimiro Needle for evaluation of the behavioral disturbance. Patient and her husband advised that if her symptoms worsen or she develops any new symptoms they will let  us know. Follow-up in 3 months with Dr. Brett Fairy.   Ward Givens, MSN, NP-C 11/21/2015, 10:20 AM [facility] [address] [place], [place] [address] [phone]

## 2015-11-21 NOTE — Telephone Encounter (Signed)
Received fax confirmation [phone]. Ativan.

## 2015-11-21 NOTE — Telephone Encounter (Signed)
I called husband back and relayed that any oral medication takes about 30 minutes to see some effect. He verbalized understanding.   Pt will  Be taking this twice daily.  (AM and at bedtime).

## 2015-11-24 ENCOUNTER — Other Ambulatory Visit: Payer: Self-pay | Admitting: Neurology

## 2015-11-27 DIAGNOSIS — L97421 Non-pressure chronic ulcer of left heel and midfoot limited to breakdown of skin: Secondary | ICD-10-CM | POA: Diagnosis not present

## 2015-11-27 DIAGNOSIS — E11621 Type 2 diabetes mellitus with foot ulcer: Secondary | ICD-10-CM | POA: Diagnosis not present

## 2015-11-27 DIAGNOSIS — L97411 Non-pressure chronic ulcer of right heel and midfoot limited to breakdown of skin: Secondary | ICD-10-CM | POA: Diagnosis not present

## 2015-11-28 DIAGNOSIS — L89613 Pressure ulcer of right heel, stage 3: Secondary | ICD-10-CM | POA: Diagnosis not present

## 2015-11-28 DIAGNOSIS — L89623 Pressure ulcer of left heel, stage 3: Secondary | ICD-10-CM | POA: Diagnosis not present

## 2015-11-30 ENCOUNTER — Other Ambulatory Visit: Payer: Self-pay | Admitting: Neurology

## 2015-11-30 DIAGNOSIS — L89623 Pressure ulcer of left heel, stage 3: Secondary | ICD-10-CM | POA: Diagnosis not present

## 2015-11-30 DIAGNOSIS — L89613 Pressure ulcer of right heel, stage 3: Secondary | ICD-10-CM | POA: Diagnosis not present

## 2015-12-03 DIAGNOSIS — S72142D Displaced intertrochanteric fracture of left femur, subsequent encounter for closed fracture with routine healing: Secondary | ICD-10-CM | POA: Diagnosis not present

## 2015-12-03 DIAGNOSIS — S72142A Displaced intertrochanteric fracture of left femur, initial encounter for closed fracture: Secondary | ICD-10-CM | POA: Diagnosis not present

## 2015-12-04 DIAGNOSIS — L97422 Non-pressure chronic ulcer of left heel and midfoot with fat layer exposed: Secondary | ICD-10-CM | POA: Diagnosis not present

## 2015-12-04 DIAGNOSIS — E11621 Type 2 diabetes mellitus with foot ulcer: Secondary | ICD-10-CM | POA: Diagnosis not present

## 2015-12-05 DIAGNOSIS — L89613 Pressure ulcer of right heel, stage 3: Secondary | ICD-10-CM | POA: Diagnosis not present

## 2015-12-05 DIAGNOSIS — L89623 Pressure ulcer of left heel, stage 3: Secondary | ICD-10-CM | POA: Diagnosis not present

## 2015-12-11 DIAGNOSIS — L97422 Non-pressure chronic ulcer of left heel and midfoot with fat layer exposed: Secondary | ICD-10-CM | POA: Diagnosis not present

## 2015-12-11 DIAGNOSIS — S81801A Unspecified open wound, right lower leg, initial encounter: Secondary | ICD-10-CM | POA: Diagnosis not present

## 2015-12-11 DIAGNOSIS — S51801A Unspecified open wound of right forearm, initial encounter: Secondary | ICD-10-CM | POA: Diagnosis not present

## 2015-12-11 DIAGNOSIS — L89613 Pressure ulcer of right heel, stage 3: Secondary | ICD-10-CM | POA: Diagnosis not present

## 2015-12-11 DIAGNOSIS — L89623 Pressure ulcer of left heel, stage 3: Secondary | ICD-10-CM | POA: Diagnosis not present

## 2015-12-11 DIAGNOSIS — E11621 Type 2 diabetes mellitus with foot ulcer: Secondary | ICD-10-CM | POA: Diagnosis not present

## 2015-12-11 DIAGNOSIS — S81001A Unspecified open wound, right knee, initial encounter: Secondary | ICD-10-CM | POA: Diagnosis not present

## 2015-12-12 DIAGNOSIS — L89613 Pressure ulcer of right heel, stage 3: Secondary | ICD-10-CM | POA: Diagnosis not present

## 2015-12-12 DIAGNOSIS — L89623 Pressure ulcer of left heel, stage 3: Secondary | ICD-10-CM | POA: Diagnosis not present

## 2015-12-14 DIAGNOSIS — L89613 Pressure ulcer of right heel, stage 3: Secondary | ICD-10-CM | POA: Diagnosis not present

## 2015-12-14 DIAGNOSIS — L89623 Pressure ulcer of left heel, stage 3: Secondary | ICD-10-CM | POA: Diagnosis not present

## 2015-12-16 ENCOUNTER — Other Ambulatory Visit: Payer: Self-pay

## 2015-12-16 DIAGNOSIS — L89623 Pressure ulcer of left heel, stage 3: Secondary | ICD-10-CM | POA: Diagnosis not present

## 2015-12-16 DIAGNOSIS — L89613 Pressure ulcer of right heel, stage 3: Secondary | ICD-10-CM | POA: Diagnosis not present

## 2015-12-16 MED ORDER — DONEPEZIL HCL 23 MG PO TABS: ORAL_TABLET | ORAL | 3 refills | Status: AC

## 2015-12-16 MED ORDER — DONEPEZIL HCL 23 MG PO TABS: ORAL_TABLET | ORAL | 5 refills | Status: DC

## 2015-12-17 DIAGNOSIS — L89623 Pressure ulcer of left heel, stage 3: Secondary | ICD-10-CM | POA: Diagnosis not present

## 2015-12-17 DIAGNOSIS — L89613 Pressure ulcer of right heel, stage 3: Secondary | ICD-10-CM | POA: Diagnosis not present

## 2015-12-18 ENCOUNTER — Encounter (HOSPITAL_BASED_OUTPATIENT_CLINIC_OR_DEPARTMENT_OTHER): Payer: Medicare Other | Attending: Surgery

## 2015-12-18 DIAGNOSIS — E11621 Type 2 diabetes mellitus with foot ulcer: Secondary | ICD-10-CM | POA: Insufficient documentation

## 2015-12-18 DIAGNOSIS — Z85828 Personal history of other malignant neoplasm of skin: Secondary | ICD-10-CM | POA: Diagnosis not present

## 2015-12-18 DIAGNOSIS — X58XXXA Exposure to other specified factors, initial encounter: Secondary | ICD-10-CM | POA: Diagnosis not present

## 2015-12-18 DIAGNOSIS — L97421 Non-pressure chronic ulcer of left heel and midfoot limited to breakdown of skin: Secondary | ICD-10-CM | POA: Diagnosis not present

## 2015-12-18 DIAGNOSIS — E1151 Type 2 diabetes mellitus with diabetic peripheral angiopathy without gangrene: Secondary | ICD-10-CM | POA: Diagnosis not present

## 2015-12-18 DIAGNOSIS — L97422 Non-pressure chronic ulcer of left heel and midfoot with fat layer exposed: Secondary | ICD-10-CM | POA: Diagnosis not present

## 2015-12-18 DIAGNOSIS — M069 Rheumatoid arthritis, unspecified: Secondary | ICD-10-CM | POA: Diagnosis not present

## 2015-12-18 DIAGNOSIS — Z9221 Personal history of antineoplastic chemotherapy: Secondary | ICD-10-CM | POA: Insufficient documentation

## 2015-12-18 DIAGNOSIS — Z87891 Personal history of nicotine dependence: Secondary | ICD-10-CM | POA: Diagnosis not present

## 2015-12-18 DIAGNOSIS — Z85118 Personal history of other malignant neoplasm of bronchus and lung: Secondary | ICD-10-CM | POA: Insufficient documentation

## 2015-12-18 DIAGNOSIS — F0281 Dementia in other diseases classified elsewhere with behavioral disturbance: Secondary | ICD-10-CM | POA: Diagnosis not present

## 2015-12-18 DIAGNOSIS — L97411 Non-pressure chronic ulcer of right heel and midfoot limited to breakdown of skin: Secondary | ICD-10-CM | POA: Diagnosis not present

## 2015-12-18 DIAGNOSIS — S81001A Unspecified open wound, right knee, initial encounter: Secondary | ICD-10-CM | POA: Diagnosis not present

## 2015-12-18 DIAGNOSIS — S81801A Unspecified open wound, right lower leg, initial encounter: Secondary | ICD-10-CM | POA: Diagnosis not present

## 2015-12-18 DIAGNOSIS — L89613 Pressure ulcer of right heel, stage 3: Secondary | ICD-10-CM | POA: Diagnosis not present

## 2015-12-18 DIAGNOSIS — S61501A Unspecified open wound of right wrist, initial encounter: Secondary | ICD-10-CM | POA: Insufficient documentation

## 2015-12-18 DIAGNOSIS — I739 Peripheral vascular disease, unspecified: Secondary | ICD-10-CM | POA: Diagnosis not present

## 2015-12-18 DIAGNOSIS — L89623 Pressure ulcer of left heel, stage 3: Secondary | ICD-10-CM | POA: Diagnosis not present

## 2015-12-18 DIAGNOSIS — E11622 Type 2 diabetes mellitus with other skin ulcer: Secondary | ICD-10-CM | POA: Diagnosis not present

## 2015-12-19 DIAGNOSIS — L89623 Pressure ulcer of left heel, stage 3: Secondary | ICD-10-CM | POA: Diagnosis not present

## 2015-12-19 DIAGNOSIS — L89613 Pressure ulcer of right heel, stage 3: Secondary | ICD-10-CM | POA: Diagnosis not present

## 2015-12-23 DIAGNOSIS — L89613 Pressure ulcer of right heel, stage 3: Secondary | ICD-10-CM | POA: Diagnosis not present

## 2015-12-23 DIAGNOSIS — L89623 Pressure ulcer of left heel, stage 3: Secondary | ICD-10-CM | POA: Diagnosis not present

## 2015-12-24 DIAGNOSIS — L89623 Pressure ulcer of left heel, stage 3: Secondary | ICD-10-CM | POA: Diagnosis not present

## 2015-12-24 DIAGNOSIS — F0391 Unspecified dementia with behavioral disturbance: Secondary | ICD-10-CM | POA: Diagnosis not present

## 2015-12-26 DIAGNOSIS — F0391 Unspecified dementia with behavioral disturbance: Secondary | ICD-10-CM | POA: Diagnosis not present

## 2015-12-26 DIAGNOSIS — L89623 Pressure ulcer of left heel, stage 3: Secondary | ICD-10-CM | POA: Diagnosis not present

## 2015-12-27 DIAGNOSIS — L89623 Pressure ulcer of left heel, stage 3: Secondary | ICD-10-CM | POA: Diagnosis not present

## 2015-12-27 DIAGNOSIS — F0391 Unspecified dementia with behavioral disturbance: Secondary | ICD-10-CM | POA: Diagnosis not present

## 2015-12-29 DIAGNOSIS — F0391 Unspecified dementia with behavioral disturbance: Secondary | ICD-10-CM | POA: Diagnosis not present

## 2015-12-29 DIAGNOSIS — L89623 Pressure ulcer of left heel, stage 3: Secondary | ICD-10-CM | POA: Diagnosis not present

## 2015-12-31 DIAGNOSIS — S81801A Unspecified open wound, right lower leg, initial encounter: Secondary | ICD-10-CM | POA: Diagnosis not present

## 2015-12-31 DIAGNOSIS — L97422 Non-pressure chronic ulcer of left heel and midfoot with fat layer exposed: Secondary | ICD-10-CM | POA: Diagnosis not present

## 2015-12-31 DIAGNOSIS — S81001A Unspecified open wound, right knee, initial encounter: Secondary | ICD-10-CM | POA: Diagnosis not present

## 2015-12-31 DIAGNOSIS — S61501A Unspecified open wound of right wrist, initial encounter: Secondary | ICD-10-CM | POA: Diagnosis not present

## 2015-12-31 DIAGNOSIS — E1151 Type 2 diabetes mellitus with diabetic peripheral angiopathy without gangrene: Secondary | ICD-10-CM | POA: Diagnosis not present

## 2015-12-31 DIAGNOSIS — E11621 Type 2 diabetes mellitus with foot ulcer: Secondary | ICD-10-CM | POA: Diagnosis not present

## 2016-01-01 DIAGNOSIS — F0281 Dementia in other diseases classified elsewhere with behavioral disturbance: Secondary | ICD-10-CM | POA: Diagnosis not present

## 2016-01-03 DIAGNOSIS — L89623 Pressure ulcer of left heel, stage 3: Secondary | ICD-10-CM | POA: Diagnosis not present

## 2016-01-03 DIAGNOSIS — F0391 Unspecified dementia with behavioral disturbance: Secondary | ICD-10-CM | POA: Diagnosis not present

## 2016-01-06 ENCOUNTER — Emergency Department (HOSPITAL_COMMUNITY)
Admission: EM | Admit: 2016-01-06 | Discharge: 2016-01-07 | Disposition: A | Discharge status: Home / Self Care | Payer: Medicare Other | Source: Home / Self Care | Attending: Emergency Medicine | Admitting: Emergency Medicine

## 2016-01-06 ENCOUNTER — Emergency Department (HOSPITAL_COMMUNITY): Payer: Medicare Other

## 2016-01-06 ENCOUNTER — Encounter (HOSPITAL_COMMUNITY): Payer: Self-pay | Admitting: Emergency Medicine

## 2016-01-06 DIAGNOSIS — E1140 Type 2 diabetes mellitus with diabetic neuropathy, unspecified: Secondary | ICD-10-CM | POA: Insufficient documentation

## 2016-01-06 DIAGNOSIS — R197 Diarrhea, unspecified: Principal | ICD-10-CM

## 2016-01-06 DIAGNOSIS — N201 Calculus of ureter: Secondary | ICD-10-CM | POA: Diagnosis not present

## 2016-01-06 DIAGNOSIS — Z466 Encounter for fitting and adjustment of urinary device: Secondary | ICD-10-CM | POA: Diagnosis not present

## 2016-01-06 DIAGNOSIS — Z87891 Personal history of nicotine dependence: Secondary | ICD-10-CM

## 2016-01-06 DIAGNOSIS — Z7982 Long term (current) use of aspirin: Secondary | ICD-10-CM | POA: Insufficient documentation

## 2016-01-06 DIAGNOSIS — R6889 Other general symptoms and signs: Secondary | ICD-10-CM | POA: Diagnosis not present

## 2016-01-06 DIAGNOSIS — N390 Urinary tract infection, site not specified: Secondary | ICD-10-CM | POA: Diagnosis not present

## 2016-01-06 DIAGNOSIS — Z79899 Other long term (current) drug therapy: Secondary | ICD-10-CM | POA: Insufficient documentation

## 2016-01-06 DIAGNOSIS — N1330 Unspecified hydronephrosis: Secondary | ICD-10-CM

## 2016-01-06 DIAGNOSIS — F0391 Unspecified dementia with behavioral disturbance: Secondary | ICD-10-CM | POA: Diagnosis not present

## 2016-01-06 DIAGNOSIS — R112 Nausea with vomiting, unspecified: Secondary | ICD-10-CM | POA: Diagnosis not present

## 2016-01-06 DIAGNOSIS — N132 Hydronephrosis with renal and ureteral calculous obstruction: Secondary | ICD-10-CM | POA: Diagnosis not present

## 2016-01-06 DIAGNOSIS — Z85118 Personal history of other malignant neoplasm of bronchus and lung: Secondary | ICD-10-CM | POA: Insufficient documentation

## 2016-01-06 DIAGNOSIS — L89623 Pressure ulcer of left heel, stage 3: Secondary | ICD-10-CM | POA: Diagnosis not present

## 2016-01-06 DIAGNOSIS — R1032 Left lower quadrant pain: Secondary | ICD-10-CM

## 2016-01-06 LAB — CBC WITH DIFFERENTIAL/PLATELET
Basophils Absolute: 0 10*3/uL (ref 0.0–0.1)
Basophils Relative: 0 %
EOS ABS: 0 10*3/uL (ref 0.0–0.7)
EOS PCT: 0 %
HCT: 39.5 % (ref 36.0–46.0)
Hemoglobin: 13.5 g/dL (ref 12.0–15.0)
LYMPHS ABS: 1 10*3/uL (ref 0.7–4.0)
Lymphocytes Relative: 8 %
MCH: 29.8 pg (ref 26.0–34.0)
MCHC: 34.2 g/dL (ref 30.0–36.0)
MCV: 87.2 fL (ref 78.0–100.0)
MONO ABS: 0.8 10*3/uL (ref 0.1–1.0)
MONOS PCT: 7 %
Neutro Abs: 9.9 10*3/uL — ABNORMAL HIGH (ref 1.7–7.7)
Neutrophils Relative %: 85 %
PLATELETS: 207 10*3/uL (ref 150–400)
RBC: 4.53 MIL/uL (ref 3.87–5.11)
RDW: 14.6 % (ref 11.5–15.5)
WBC: 11.7 10*3/uL — AB (ref 4.0–10.5)

## 2016-01-06 LAB — COMPREHENSIVE METABOLIC PANEL
ALT: 12 U/L — ABNORMAL LOW (ref 14–54)
ANION GAP: 11 (ref 5–15)
AST: 19 U/L (ref 15–41)
Albumin: 2.8 g/dL — ABNORMAL LOW (ref 3.5–5.0)
Alkaline Phosphatase: 82 U/L (ref 38–126)
BUN: 18 mg/dL (ref 6–20)
CHLORIDE: 105 mmol/L (ref 101–111)
CO2: 25 mmol/L (ref 22–32)
Calcium: 9.2 mg/dL (ref 8.9–10.3)
Creatinine, Ser: 1.04 mg/dL — ABNORMAL HIGH (ref 0.44–1.00)
GFR, EST AFRICAN AMERICAN: 58 mL/min — AB (ref >60)
GFR, EST NON AFRICAN AMERICAN: 50 mL/min — AB (ref >60)
Glucose, Bld: 139 mg/dL — ABNORMAL HIGH (ref 65–99)
POTASSIUM: 3.3 mmol/L — AB (ref 3.5–5.1)
SODIUM: 141 mmol/L (ref 135–145)
Total Bilirubin: 1.4 mg/dL — ABNORMAL HIGH (ref 0.3–1.2)
Total Protein: 5.9 g/dL — ABNORMAL LOW (ref 6.5–8.1)

## 2016-01-06 LAB — URINALYSIS, ROUTINE W REFLEX MICROSCOPIC
Bilirubin Urine: NEGATIVE
Glucose, UA: NEGATIVE mg/dL
Ketones, ur: 40 mg/dL — AB
NITRITE: NEGATIVE
PROTEIN: 100 mg/dL — AB
SPECIFIC GRAVITY, URINE: 1.016 (ref 1.005–1.030)
pH: 6 (ref 5.0–8.0)

## 2016-01-06 LAB — URINE MICROSCOPIC-ADD ON

## 2016-01-06 LAB — LIPASE, BLOOD: LIPASE: 21 U/L (ref 11–51)

## 2016-01-06 MED ORDER — IOPAMIDOL (ISOVUE-300) INJECTION 61%
100.0000 mL | Freq: Once | INTRAVENOUS | Status: AC | PRN
  Administered 2016-01-06: 100 mL via INTRAVENOUS

## 2016-01-06 MED ORDER — ONDANSETRON HCL 4 MG/2ML IJ SOLN
4.0000 mg | Freq: Once | INTRAMUSCULAR | Status: AC
  Administered 2016-01-06: 4 mg via INTRAVENOUS
  Filled 2016-01-06: qty 2

## 2016-01-06 MED ORDER — SODIUM CHLORIDE 0.9 % IJ SOLN
INTRAMUSCULAR | Status: DC
Start: 2016-01-06 — End: 2016-01-07
  Filled 2016-01-06: qty 50

## 2016-01-06 MED ORDER — SODIUM CHLORIDE 0.9 % IV SOLN
1000.0000 mL | Freq: Once | INTRAVENOUS | Status: AC
  Administered 2016-01-06: 1000 mL via INTRAVENOUS

## 2016-01-06 MED ORDER — SODIUM CHLORIDE 0.9 % IV SOLN: 1000.0000 mL | INTRAVENOUS | Status: DC

## 2016-01-06 MED ORDER — IOPAMIDOL (ISOVUE-300) INJECTION 61%
INTRAVENOUS | Status: AC
  Filled 2016-01-06: qty 100

## 2016-01-06 NOTE — ED Triage Notes (Signed)
Patient BIB [facility] from home for N/V/D x2 days. Husband reported patient not eating or drinking well. Pt is alert and follows commands has hx of dementia, husband is primary caregiver and reports patient is acting normal at baseline other than intake. Upon arrival pt temp 99.1 and o2 sat 90% on ra. Placed on 2L of o2

## 2016-01-06 NOTE — ED Notes (Signed)
Patients husband states pt has had foley since May and it was last changed yesterday 01/05/16

## 2016-01-06 NOTE — ED Notes (Signed)
Bed: [facility] Expected date:  Expected time:  Means of arrival:  Comments: N,v,d x two days

## 2016-01-07 DIAGNOSIS — I739 Peripheral vascular disease, unspecified: Secondary | ICD-10-CM | POA: Diagnosis not present

## 2016-01-07 DIAGNOSIS — E11621 Type 2 diabetes mellitus with foot ulcer: Secondary | ICD-10-CM | POA: Diagnosis not present

## 2016-01-07 DIAGNOSIS — L89623 Pressure ulcer of left heel, stage 3: Secondary | ICD-10-CM | POA: Diagnosis not present

## 2016-01-07 DIAGNOSIS — F0281 Dementia in other diseases classified elsewhere with behavioral disturbance: Secondary | ICD-10-CM | POA: Diagnosis not present

## 2016-01-07 DIAGNOSIS — F0391 Unspecified dementia with behavioral disturbance: Secondary | ICD-10-CM | POA: Diagnosis not present

## 2016-01-07 MED ORDER — ONDANSETRON 8 MG PO TBDP: 8.0000 mg | ORAL_TABLET | Freq: Three times a day (TID) | ORAL | 0 refills | Status: AC | PRN

## 2016-01-07 NOTE — ED Provider Notes (Signed)
[facility] DEPT Provider Note   CSN: [id number] Arrival date & time: 01/06/16  1944     History   Chief Complaint Chief Complaint  Patient presents with  . Nausea  . Emesis  . Diarrhea    Level V caveat: Dementia  HPI Marcia [name] is a 81 y.o. female.  HPI Patient presents to the emergency department with nausea vomiting and diarrhea 2 days.  Husband reports decreased eating and drinking.  Patient has a history of dementia and the husband is the caregiver along with 24-hour in-home help.  No recent sick contacts.  No fevers or chills.  Patient has a chronic indwelling Foley catheter, which she has had since May 2017 after urinary retention following hip surgery.  She never fully recovered from her hip surgery and now is bedridden.  He has had urinary tract infections before but she usually becomes extremely confused.  Husband reports no confusion over the past several days.  Decreased oral intake today and more weak.  Patient complains of no pain   Past Medical History:  Diagnosis Date  . Cancer ([place])   . Carcinoma ([place])    lung cell-stage1  . Colonic polyp   . Diabetes mellitus   . Goiter    with hypothyrodism  . Hyperlipemia   . Lymph node disorder 10R,11R   excision  . Memory loss   . Neuropathy ([facility])    peripheral  . Osteopenia   . Rheumatoid arthritis(714.0)   . Wears glasses    reading    Patient Active Problem List   Diagnosis Date Noted  . UTI (lower urinary tract infection) 07/11/2015  . Acute encephalopathy 07/11/2015  . Dementia 07/11/2015  . Diabetes ([place]) 07/11/2015  . Hypokalemia 07/11/2015  . Acute blood loss anemia 07/11/2015  . Hyperglycemia 07/11/2015  . Hydrocephalus 07/11/2015  . NPH (normal pressure hydrocephalus) 07/11/2015  . Rheumatoid arthritis involving both shoulders with positive rheumatoid factor ([place]) 05/23/2015  . Malignant neoplasm of upper lobe of left lung ([place]) 05/23/2015  . Subcortical vascular dementia 05/23/2015    . Malignant neoplasm of upper lobe of right lung ([place]) 04/16/2015  . Type II or unspecified type diabetes mellitus with unspecified complication, uncontrolled 06/14/2012  . Vascular dementia, uncomplicated 30/86/5784  . Cancer of right lung ([place]) 10/08/2011    Past Surgical History:  Procedure Laterality Date  . CATARACT EXTRACTION Bilateral   . CESAREAN SECTION    . FOOT SURGERY Right    x3  . LESION REMOVAL Right 09/13/2013   Procedure: EXCISION OF RIGHT LEG SQUAMOUS CELL CARCINOMA/RIGHT ARM LESION WITH ACELL;  Surgeon: Theodoro Kos, DO;  Location: [facility];  Service: Plastics;  Laterality: Right;  . LUNG SURGERY Right 2006   recurrance 2009,lower lobe superior segment,resection:2007  . LUNG SURGERY Right    secondary lower lobe margin, excision,no tumor indentified  . WRIST SURGERY Right     OB History    No data available       Home Medications    Prior to Admission medications   Medication Sig Start Date End Date Taking? Authorizing Provider  ALPRAZolam Duanne Moron) 0.5 MG tablet Take 1 tablet by mouth daily as needed for anxiety. 12/20/15  Yes Historical Provider, MD  amLODipine (NORVASC) 10 MG tablet Take 1 tablet (10 mg total) by mouth daily. 07/15/15  Yes Belkys A Regalado, MD  aspirin EC 81 MG tablet Take 81 mg by mouth daily.   Yes Historical Provider, MD  atorvastatin (LIPITOR) 40 MG  tablet Take 40 mg by mouth daily. 03/25/12  Yes Historical Provider, MD  Calcium Carbonate-Vitamin D (CALCIUM-VITAMIN D) 500-200 MG-UNIT tablet Take 1 tablet by mouth 2 (two) times daily.   Yes Historical Provider, MD  docusate sodium (COLACE) 100 MG capsule Take 100-200 mg by mouth daily.   Yes Historical Provider, MD  donepezil (ARICEPT) 23 MG TABS tablet TAKE 1 TABLET (23 MG TOTAL) BY MOUTH AT BEDTIME. 12/16/15  Yes Asencion Partridge Dohmeier, MD  ezetimibe (ZETIA) 10 MG tablet Take 10 mg by mouth daily.   Yes Historical Provider, MD  ferrous sulfate 325 (65 FE) MG tablet Take 1  tablet (325 mg total) by mouth 2 (two) times daily with a meal. 07/15/15  Yes Belkys A Regalado, MD  HUMALOG MIX 75/25 KWIKPEN (75-25) 100 UNIT/ML Kwikpen Inject 12-30 Units into the skin 2 (two) times daily as needed (high blood sugar). Sliding scale. 12/18/15  Yes Historical Provider, MD  LORazepam (ATIVAN) 0.5 MG tablet Take 1 tablet (0.5 mg total) by mouth 2 (two) times daily as needed. 11/21/15  Yes Ward Givens, NP  Melatonin 5 MG TABS Take 10 mg by mouth at bedtime.    Yes Historical Provider, MD  memantine (NAMENDA) 10 MG tablet TAKE 1 TABLET BY MOUTH TWO TIMES DAILY 11/25/15  Yes Larey Seat, MD  mirtazapine (REMERON) 7.5 MG tablet Take 7.5 mg by mouth at bedtime.   Yes Historical Provider, MD  polyethylene glycol (MIRALAX / GLYCOLAX) packet Take 17 g by mouth 2 (two) times daily. Patient taking differently: Take 17 g by mouth daily as needed for mild constipation.  07/15/15  Yes Belkys A Regalado, MD  predniSONE (DELTASONE) 5 MG tablet Take 5 mg by mouth daily.   Yes Historical Provider, MD  sodium phosphate (FLEET) enema Place 1 enema rectally daily as needed (constipation). follow package directions   Yes Historical Provider, MD  tamsulosin (FLOMAX) 0.4 MG CAPS capsule Take 1 capsule (0.4 mg total) by mouth daily. 07/15/15  Yes Belkys A Regalado, MD  Vitamin D, Ergocalciferol, (DRISDOL) 50000 UNITS CAPS capsule Take 1 capsule by mouth once a week. 07/23/13  Yes Historical Provider, MD  ondansetron (ZOFRAN ODT) 8 MG disintegrating tablet Take 1 tablet (8 mg total) by mouth every 8 (eight) hours as needed for nausea or vomiting. 01/07/16   Jola Schmidt, MD    Family History History reviewed. No pertinent family history.  Social History Social History  Substance Use Topics  . Smoking status: Former Smoker    Quit date: 01/16/2005  . Smokeless tobacco: Never Used  . Alcohol use No     Allergies   Amoxicillin; Cefaclor; Hydrocodone; and Sulfa antibiotics   Review of  Systems Review of Systems  Unable to perform ROS: Dementia     Physical Exam Updated Vital Signs BP (!) 116/44   Pulse 83   Temp 100.7 F (38.2 C) (Rectal)   Resp 22   SpO2 93%   Physical Exam  Constitutional: She is oriented to person, place, and time. She appears well-developed and well-nourished. No distress.  HENT:  Head: Normocephalic and atraumatic.  Eyes: EOM are normal.  Neck: Normal range of motion.  Cardiovascular: Normal rate, regular rhythm and normal heart sounds.   Pulmonary/Chest: Effort normal and breath sounds normal.  Abdominal: Soft. She exhibits no distension.  Mild left lower quadrant tenderness without guarding or rebound.  Genitourinary:  Genitourinary Comments: Indwelling Foley catheter with no sediment in the urine.  Urine is yellow but rather clear  Musculoskeletal: Normal range of motion.  Neurological: She is alert and oriented to person, place, and time.  Skin: Skin is warm and dry.  Psychiatric: She has a normal mood and affect. Judgment normal.  Nursing note and vitals reviewed.    ED Treatments / Results  Labs (all labs ordered are listed, but only abnormal results are displayed) Labs Reviewed  CBC WITH DIFFERENTIAL/PLATELET - Abnormal; Notable for the following:       Result Value   WBC 11.7 (*)    Neutro Abs 9.9 (*)    All other components within normal limits  COMPREHENSIVE METABOLIC PANEL - Abnormal; Notable for the following:    Potassium 3.3 (*)    Glucose, Bld 139 (*)    Creatinine, Ser 1.04 (*)    Total Protein 5.9 (*)    Albumin 2.8 (*)    ALT 12 (*)    Total Bilirubin 1.4 (*)    GFR calc non Af Amer 50 (*)    GFR calc Af Amer 58 (*)    All other components within normal limits  URINALYSIS, ROUTINE W REFLEX MICROSCOPIC (NOT AT [facility]) - Abnormal; Notable for the following:    APPearance TURBID (*)    Hgb urine dipstick MODERATE (*)    Ketones, ur 40 (*)    Protein, ur 100 (*)    Leukocytes, UA LARGE (*)    All other  components within normal limits  URINE MICROSCOPIC-ADD ON - Abnormal; Notable for the following:    Squamous Epithelial / LPF 0-5 (*)    Bacteria, UA MANY (*)    All other components within normal limits  URINE CULTURE  LIPASE, BLOOD    EKG  EKG Interpretation None       Radiology Ct Abdomen Pelvis W Contrast  Result Date: 01/06/2016 CLINICAL DATA:  Left lower quadrant pain EXAM: CT ABDOMEN AND PELVIS WITH CONTRAST TECHNIQUE: Multidetector CT imaging of the abdomen and pelvis was performed using the standard protocol following bolus administration of intravenous contrast. CONTRAST:  125m ISOVUE-300 IOPAMIDOL (ISOVUE-300) INJECTION 61% COMPARISON:  Ultrasound 10/28/2010 FINDINGS: Lower chest: There is no significant pleural effusion. There is mild subpleural fibrosis within the posterior lower lobes along with linear scarring within the bilateral lung bases. Coronary artery calcifications. Small right posterior diaphragmatic hernia with protrusion of fat into the lower posterior chest. Hepatobiliary: No biliary dilatation. Probable focal fatty infiltration near the falciform ligament. No calcified gallstones. Pancreas: Pancreas is atrophic but otherwise unremarkable. Spleen: Normal in size without focal abnormality. Adrenals/Urinary Tract: Bilateral adrenal glands are within normal limits. Left kidney is within normal limits. There is moderate hydronephrosis and dilated extrarenal pelvis on the right. Moderate-to-marked right hydroureter, secondary to a 6 mm stone in the distal right ureter, just above the right UVJ. No left-sided hydronephrosis. The bladder contains a Foley catheter and appears slightly thick walled. Stomach/Bowel: There is no dilatation of the stomach. There is no dilated small bowel. Mild rectal wall thickening. Vascular/Lymphatic: Dense atherosclerosis of non aneurysmal abdominal aorta and branch vessels. No significant retroperitoneal adenopathy. Reproductive: Status post  hysterectomy. No adnexal masses. Other: Minimal presacral edema and soft tissue stranding. Mild hazy attenuation within the central mesentery. No free air. Musculoskeletal: Degenerative changes, most notable at L5-S1. Status post left proximal femur intra medullary rod. IMPRESSION: 1. Moderate right hydronephrosis and marked right dilated extra renal pelvis and hydroureter secondary to a 6 mm stone in the distal right ureter, just above the right UVJ. No left hydronephrosis. 2. Small  right posterior diaphragmatic hernia which contains fat. 3. Nonspecific mild hazy attenuation within the central mesenteries, possibly related to mild mesenteric edema. 4. Mild rectal asymmetric thickening, could be due to proctitis or possible rectal mass, recommend correlation with physical examination. Electronically Signed   By: Donavan Foil M.D.   On: 01/06/2016 22:29    Procedures Procedures (including critical care time)  Medications Ordered in ED Medications  0.9 %  sodium chloride infusion (1,000 mLs Intravenous New Bag/Given 01/06/16 2109)    Followed by  0.9 %  sodium chloride infusion (not administered)  iopamidol (ISOVUE-300) 61 % injection (not administered)  sodium chloride 0.9 % injection (not administered)  ondansetron (ZOFRAN) injection 4 mg (4 mg Intravenous Given 01/06/16 2109)  iopamidol (ISOVUE-300) 61 % injection 100 mL (100 mLs Intravenous Contrast Given 01/06/16 2149)     Initial Impression / Assessment and Plan / ED Course  I have reviewed the triage vital signs and the nursing notes.  Pertinent labs & imaging results that were available during my care of the patient were reviewed by me and considered in my medical decision making (see chart for details).  Clinical Course     CT scan noted.  Patient has had no right-sided abdominal or right flank pain.  Patient's been comfortable appearing the entire examined emergency department family reports no history of kidney stones in her  writhing.  I suspect this stone in her distal right ureter is rather chronic.  We have no prior CT imaging of her abdomen and pelvis here or at [facility].  Unclear how long this is been there.  A definite could be a nidus for urinary tract infection but her chronic indwelling Foley catheter makes this difficult to evaluate her urine today.  Urine cultures been sent.  Mild white count.  Afebrile without reported fevers at home.  She has had nausea vomiting and diarrhea.  The majority of today has been diarrhea per the family.  No confusion.  Urine looks clear in the catheter bag.  I suspect this is an incidental and chronic finding.  She has a urologist mass that she follow-up with the urologist.  She'll be treated at home with Zofran.  Family reports that she looks much better after IV fluids and is comfortable taking her home where they have 24 hour nursing care.  I've asked that if she were to change or worsen that they return the emergency department for further evaluation.  Final Clinical Impressions(s) / ED Diagnoses   Final diagnoses:  Nausea vomiting and diarrhea  Right ureteral stone    New Prescriptions New Prescriptions   ONDANSETRON (ZOFRAN ODT) 8 MG DISINTEGRATING TABLET    Take 1 tablet (8 mg total) by mouth every 8 (eight) hours as needed for nausea or vomiting.     Jola Schmidt, MD 01/07/16 [phone]

## 2016-01-08 ENCOUNTER — Inpatient Hospital Stay (HOSPITAL_COMMUNITY)
Admission: EM | Admit: 2016-01-08 | Discharge: 2016-01-13 | DRG: 871 | Disposition: A | Discharge status: Home Health | Payer: Medicare Other | Attending: Internal Medicine | Admitting: Internal Medicine

## 2016-01-08 ENCOUNTER — Emergency Department (HOSPITAL_COMMUNITY): Payer: Medicare Other

## 2016-01-08 ENCOUNTER — Emergency Department (HOSPITAL_COMMUNITY): Payer: Medicare Other | Admitting: Certified Registered"

## 2016-01-08 ENCOUNTER — Encounter (HOSPITAL_COMMUNITY): Payer: Self-pay | Admitting: Emergency Medicine

## 2016-01-08 ENCOUNTER — Encounter (HOSPITAL_COMMUNITY)
Admission: EM | Disposition: A | Discharge status: Home Health | Payer: Self-pay | Source: Home / Self Care | Attending: Internal Medicine

## 2016-01-08 DIAGNOSIS — N1330 Unspecified hydronephrosis: Secondary | ICD-10-CM | POA: Diagnosis not present

## 2016-01-08 DIAGNOSIS — Z794 Long term (current) use of insulin: Secondary | ICD-10-CM

## 2016-01-08 DIAGNOSIS — R6520 Severe sepsis without septic shock: Secondary | ICD-10-CM | POA: Diagnosis present

## 2016-01-08 DIAGNOSIS — Z87891 Personal history of nicotine dependence: Secondary | ICD-10-CM

## 2016-01-08 DIAGNOSIS — M05712 Rheumatoid arthritis with rheumatoid factor of left shoulder without organ or systems involvement: Secondary | ICD-10-CM

## 2016-01-08 DIAGNOSIS — A4159 Other Gram-negative sepsis: Secondary | ICD-10-CM | POA: Diagnosis not present

## 2016-01-08 DIAGNOSIS — E43 Unspecified severe protein-calorie malnutrition: Secondary | ICD-10-CM | POA: Insufficient documentation

## 2016-01-08 DIAGNOSIS — Z85118 Personal history of other malignant neoplasm of bronchus and lung: Secondary | ICD-10-CM

## 2016-01-08 DIAGNOSIS — F0280 Dementia in other diseases classified elsewhere without behavioral disturbance: Secondary | ICD-10-CM | POA: Diagnosis not present

## 2016-01-08 DIAGNOSIS — L89623 Pressure ulcer of left heel, stage 3: Secondary | ICD-10-CM | POA: Diagnosis present

## 2016-01-08 DIAGNOSIS — N132 Hydronephrosis with renal and ureteral calculous obstruction: Secondary | ICD-10-CM | POA: Diagnosis not present

## 2016-01-08 DIAGNOSIS — R4182 Altered mental status, unspecified: Secondary | ICD-10-CM | POA: Diagnosis not present

## 2016-01-08 DIAGNOSIS — R509 Fever, unspecified: Secondary | ICD-10-CM | POA: Diagnosis not present

## 2016-01-08 DIAGNOSIS — F0390 Unspecified dementia without behavioral disturbance: Secondary | ICD-10-CM | POA: Diagnosis present

## 2016-01-08 DIAGNOSIS — I129 Hypertensive chronic kidney disease with stage 1 through stage 4 chronic kidney disease, or unspecified chronic kidney disease: Secondary | ICD-10-CM | POA: Diagnosis present

## 2016-01-08 DIAGNOSIS — Z7982 Long term (current) use of aspirin: Secondary | ICD-10-CM

## 2016-01-08 DIAGNOSIS — M8580 Other specified disorders of bone density and structure, unspecified site: Secondary | ICD-10-CM | POA: Diagnosis present

## 2016-01-08 DIAGNOSIS — B9689 Other specified bacterial agents as the cause of diseases classified elsewhere: Secondary | ICD-10-CM | POA: Diagnosis not present

## 2016-01-08 DIAGNOSIS — Z7401 Bed confinement status: Secondary | ICD-10-CM

## 2016-01-08 DIAGNOSIS — N139 Obstructive and reflux uropathy, unspecified: Secondary | ICD-10-CM | POA: Diagnosis present

## 2016-01-08 DIAGNOSIS — M069 Rheumatoid arthritis, unspecified: Secondary | ICD-10-CM | POA: Diagnosis present

## 2016-01-08 DIAGNOSIS — X58XXXA Exposure to other specified factors, initial encounter: Secondary | ICD-10-CM | POA: Diagnosis present

## 2016-01-08 DIAGNOSIS — E039 Hypothyroidism, unspecified: Secondary | ICD-10-CM | POA: Diagnosis present

## 2016-01-08 DIAGNOSIS — R0902 Hypoxemia: Secondary | ICD-10-CM | POA: Diagnosis present

## 2016-01-08 DIAGNOSIS — R109 Unspecified abdominal pain: Secondary | ICD-10-CM | POA: Diagnosis not present

## 2016-01-08 DIAGNOSIS — E785 Hyperlipidemia, unspecified: Secondary | ICD-10-CM | POA: Diagnosis present

## 2016-01-08 DIAGNOSIS — B964 Proteus (mirabilis) (morganii) as the cause of diseases classified elsewhere: Secondary | ICD-10-CM | POA: Diagnosis not present

## 2016-01-08 DIAGNOSIS — D72829 Elevated white blood cell count, unspecified: Secondary | ICD-10-CM | POA: Diagnosis not present

## 2016-01-08 DIAGNOSIS — E872 Acidosis: Secondary | ICD-10-CM | POA: Diagnosis present

## 2016-01-08 DIAGNOSIS — M05711 Rheumatoid arthritis with rheumatoid factor of right shoulder without organ or systems involvement: Secondary | ICD-10-CM | POA: Diagnosis present

## 2016-01-08 DIAGNOSIS — B961 Klebsiella pneumoniae [K. pneumoniae] as the cause of diseases classified elsewhere: Secondary | ICD-10-CM | POA: Diagnosis present

## 2016-01-08 DIAGNOSIS — D62 Acute posthemorrhagic anemia: Secondary | ICD-10-CM | POA: Diagnosis not present

## 2016-01-08 DIAGNOSIS — E1122 Type 2 diabetes mellitus with diabetic chronic kidney disease: Secondary | ICD-10-CM | POA: Diagnosis present

## 2016-01-08 DIAGNOSIS — E119 Type 2 diabetes mellitus without complications: Secondary | ICD-10-CM | POA: Diagnosis not present

## 2016-01-08 DIAGNOSIS — E118 Type 2 diabetes mellitus with unspecified complications: Secondary | ICD-10-CM | POA: Diagnosis not present

## 2016-01-08 DIAGNOSIS — E1121 Type 2 diabetes mellitus with diabetic nephropathy: Secondary | ICD-10-CM | POA: Diagnosis present

## 2016-01-08 DIAGNOSIS — F0150 Vascular dementia without behavioral disturbance: Secondary | ICD-10-CM | POA: Diagnosis not present

## 2016-01-08 DIAGNOSIS — L8990 Pressure ulcer of unspecified site, unspecified stage: Secondary | ICD-10-CM | POA: Insufficient documentation

## 2016-01-08 DIAGNOSIS — C3491 Malignant neoplasm of unspecified part of right bronchus or lung: Secondary | ICD-10-CM | POA: Diagnosis present

## 2016-01-08 DIAGNOSIS — Z6820 Body mass index (BMI) 20.0-20.9, adult: Secondary | ICD-10-CM

## 2016-01-08 DIAGNOSIS — R404 Transient alteration of awareness: Secondary | ICD-10-CM | POA: Diagnosis not present

## 2016-01-08 DIAGNOSIS — N201 Calculus of ureter: Secondary | ICD-10-CM | POA: Diagnosis not present

## 2016-01-08 DIAGNOSIS — Z9221 Personal history of antineoplastic chemotherapy: Secondary | ICD-10-CM

## 2016-01-08 DIAGNOSIS — S80811A Abrasion, right lower leg, initial encounter: Secondary | ICD-10-CM | POA: Diagnosis present

## 2016-01-08 DIAGNOSIS — N136 Pyonephrosis: Secondary | ICD-10-CM | POA: Diagnosis present

## 2016-01-08 DIAGNOSIS — N183 Chronic kidney disease, stage 3 (moderate): Secondary | ICD-10-CM | POA: Diagnosis present

## 2016-01-08 DIAGNOSIS — N200 Calculus of kidney: Secondary | ICD-10-CM | POA: Diagnosis not present

## 2016-01-08 DIAGNOSIS — Z7952 Long term (current) use of systemic steroids: Secondary | ICD-10-CM

## 2016-01-08 DIAGNOSIS — E876 Hypokalemia: Secondary | ICD-10-CM | POA: Diagnosis present

## 2016-01-08 DIAGNOSIS — Z79899 Other long term (current) drug therapy: Secondary | ICD-10-CM

## 2016-01-08 DIAGNOSIS — N179 Acute kidney failure, unspecified: Secondary | ICD-10-CM | POA: Diagnosis not present

## 2016-01-08 DIAGNOSIS — Z86010 Personal history of colonic polyps: Secondary | ICD-10-CM

## 2016-01-08 DIAGNOSIS — Z960 Presence of urogenital implants: Secondary | ICD-10-CM | POA: Diagnosis not present

## 2016-01-08 HISTORY — PX: CYSTOSCOPY WITH STENT PLACEMENT: SHX5790

## 2016-01-08 LAB — COMPREHENSIVE METABOLIC PANEL
ALT: 13 U/L — ABNORMAL LOW (ref 14–54)
AST: 27 U/L (ref 15–41)
Albumin: 2.5 g/dL — ABNORMAL LOW (ref 3.5–5.0)
Alkaline Phosphatase: 87 U/L (ref 38–126)
Anion gap: 12 (ref 5–15)
BUN: 24 mg/dL — ABNORMAL HIGH (ref 6–20)
CHLORIDE: 101 mmol/L (ref 101–111)
CO2: 25 mmol/L (ref 22–32)
Calcium: 8.4 mg/dL — ABNORMAL LOW (ref 8.9–10.3)
Creatinine, Ser: 1.71 mg/dL — ABNORMAL HIGH (ref 0.44–1.00)
GFR, EST AFRICAN AMERICAN: 32 mL/min — AB (ref >60)
GFR, EST NON AFRICAN AMERICAN: 27 mL/min — AB (ref >60)
Glucose, Bld: 281 mg/dL — ABNORMAL HIGH (ref 65–99)
POTASSIUM: 3.7 mmol/L (ref 3.5–5.1)
Sodium: 138 mmol/L (ref 135–145)
Total Bilirubin: 1.1 mg/dL (ref 0.3–1.2)
Total Protein: 6.3 g/dL — ABNORMAL LOW (ref 6.5–8.1)

## 2016-01-08 LAB — CBC
HEMATOCRIT: 38.9 % (ref 36.0–46.0)
Hemoglobin: 12.7 g/dL (ref 12.0–15.0)
MCH: 29.3 pg (ref 26.0–34.0)
MCHC: 32.6 g/dL (ref 30.0–36.0)
MCV: 89.6 fL (ref 78.0–100.0)
PLATELETS: 211 10*3/uL (ref 150–400)
RBC: 4.34 MIL/uL (ref 3.87–5.11)
RDW: 14.6 % (ref 11.5–15.5)
WBC: 11.7 10*3/uL — AB (ref 4.0–10.5)

## 2016-01-08 LAB — I-STAT CG4 LACTIC ACID, ED
LACTIC ACID, VENOUS: 2.08 mmol/L — AB (ref 0.5–1.9)
Lactic Acid, Venous: 3.01 mmol/L (ref 0.5–1.9)

## 2016-01-08 LAB — URINALYSIS, ROUTINE W REFLEX MICROSCOPIC
BILIRUBIN URINE: NEGATIVE
Glucose, UA: NEGATIVE mg/dL
KETONES UR: 40 mg/dL — AB
NITRITE: POSITIVE — AB
PH: 6.5 (ref 5.0–8.0)
Protein, ur: 100 mg/dL — AB
Specific Gravity, Urine: 1.016 (ref 1.005–1.030)

## 2016-01-08 LAB — URINE MICROSCOPIC-ADD ON

## 2016-01-08 LAB — CBG MONITORING, ED: Glucose-Capillary: 318 mg/dL — ABNORMAL HIGH (ref 65–99)

## 2016-01-08 LAB — TSH: TSH: 1.257 u[IU]/mL (ref 0.350–4.500)

## 2016-01-08 LAB — LIPASE, BLOOD: Lipase: 28 U/L (ref 11–51)

## 2016-01-08 SURGERY — CYSTOSCOPY, WITH STENT INSERTION
Anesthesia: General | Site: Urethra | Laterality: Right

## 2016-01-08 MED ORDER — INSULIN ASPART 100 UNIT/ML ~~LOC~~ SOLN
0.0000 [IU] | Freq: Three times a day (TID) | SUBCUTANEOUS | Status: DC
  Administered 2016-01-09: 2 [IU] via SUBCUTANEOUS
  Administered 2016-01-09: 3 [IU] via SUBCUTANEOUS
  Administered 2016-01-10: 2 [IU] via SUBCUTANEOUS
  Administered 2016-01-10: 3 [IU] via SUBCUTANEOUS
  Administered 2016-01-10: 5 [IU] via SUBCUTANEOUS
  Administered 2016-01-11 (×3): 3 [IU] via SUBCUTANEOUS
  Administered 2016-01-12: 5 [IU] via SUBCUTANEOUS
  Administered 2016-01-12 (×2): 8 [IU] via SUBCUTANEOUS
  Administered 2016-01-13: 11 [IU] via SUBCUTANEOUS
  Administered 2016-01-13: 5 [IU] via SUBCUTANEOUS

## 2016-01-08 MED ORDER — ONDANSETRON HCL 4 MG PO TABS: 4.0000 mg | ORAL_TABLET | Freq: Four times a day (QID) | ORAL | Status: DC | PRN

## 2016-01-08 MED ORDER — DONEPEZIL HCL 23 MG PO TABS
23.0000 mg | ORAL_TABLET | Freq: Every day | ORAL | Status: DC
Start: 2016-01-08 — End: 2016-01-13
  Administered 2016-01-09 – 2016-01-13 (×4): 23 mg via ORAL
  Filled 2016-01-08 (×5): qty 1

## 2016-01-08 MED ORDER — ONDANSETRON HCL 4 MG/2ML IJ SOLN: 4.0000 mg | Freq: Four times a day (QID) | INTRAMUSCULAR | Status: DC | PRN

## 2016-01-08 MED ORDER — POLYETHYLENE GLYCOL 3350 17 G PO PACK
17.0000 g | PACK | Freq: Every day | ORAL | Status: DC | PRN
  Administered 2016-01-11: 17 g via ORAL
  Filled 2016-01-08: qty 1

## 2016-01-08 MED ORDER — MEMANTINE HCL 10 MG PO TABS
10.0000 mg | ORAL_TABLET | Freq: Two times a day (BID) | ORAL | Status: DC
  Administered 2016-01-09 – 2016-01-13 (×10): 10 mg via ORAL
  Filled 2016-01-08 (×11): qty 1

## 2016-01-08 MED ORDER — DEXTROSE 5 % IV SOLN
2.0000 g | Freq: Once | INTRAVENOUS | Status: AC
  Administered 2016-01-08: 2 g via INTRAVENOUS
  Filled 2016-01-08: qty 2

## 2016-01-08 MED ORDER — DEXTROSE 5 % IV SOLN
1.0000 g | INTRAVENOUS | Status: DC
  Filled 2016-01-08: qty 1

## 2016-01-08 MED ORDER — PREDNISONE 5 MG PO TABS
5.0000 mg | ORAL_TABLET | Freq: Every day | ORAL | Status: DC
  Administered 2016-01-09 – 2016-01-13 (×5): 5 mg via ORAL
  Filled 2016-01-08 (×6): qty 1

## 2016-01-08 MED ORDER — ACETAMINOPHEN 325 MG PO TABS
650.0000 mg | ORAL_TABLET | Freq: Four times a day (QID) | ORAL | Status: DC | PRN
  Administered 2016-01-12 – 2016-01-13 (×2): 650 mg via ORAL
  Filled 2016-01-08 (×2): qty 2

## 2016-01-08 MED ORDER — SODIUM CHLORIDE 0.9 % IV SOLN
INTRAVENOUS | Status: DC
  Administered 2016-01-08 – 2016-01-11 (×5): via INTRAVENOUS

## 2016-01-08 MED ORDER — ATORVASTATIN CALCIUM 40 MG PO TABS
40.0000 mg | ORAL_TABLET | Freq: Every day | ORAL | Status: DC
  Administered 2016-01-09 – 2016-01-13 (×5): 40 mg via ORAL
  Filled 2016-01-08 (×6): qty 1

## 2016-01-08 MED ORDER — SODIUM CHLORIDE 0.9 % IV BOLUS (SEPSIS)
250.0000 mL | Freq: Once | INTRAVENOUS | Status: AC
  Administered 2016-01-08: 250 mL via INTRAVENOUS

## 2016-01-08 MED ORDER — STERILE WATER FOR IRRIGATION IR SOLN
Status: DC | PRN
  Administered 2016-01-08: 3000 mL via INTRAVESICAL

## 2016-01-08 MED ORDER — ACETAMINOPHEN 650 MG RE SUPP
650.0000 mg | Freq: Four times a day (QID) | RECTAL | Status: DC | PRN
  Administered 2016-01-09 – 2016-01-10 (×2): 650 mg via RECTAL
  Filled 2016-01-08 (×2): qty 1

## 2016-01-08 MED ORDER — SODIUM CHLORIDE 0.9 % IV SOLN: INTRAVENOUS | Status: DC

## 2016-01-08 MED ORDER — CALCIUM CARBONATE-VITAMIN D 500-200 MG-UNIT PO TABS
1.0000 | ORAL_TABLET | Freq: Two times a day (BID) | ORAL | Status: DC
  Administered 2016-01-11 – 2016-01-13 (×5): 1 via ORAL
  Filled 2016-01-08 (×11): qty 1

## 2016-01-08 MED ORDER — TRAMADOL HCL 50 MG PO TABS
50.0000 mg | ORAL_TABLET | Freq: Four times a day (QID) | ORAL | Status: DC | PRN
  Administered 2016-01-13: 50 mg via ORAL
  Filled 2016-01-08: qty 1

## 2016-01-08 MED ORDER — EZETIMIBE 10 MG PO TABS
10.0000 mg | ORAL_TABLET | Freq: Every day | ORAL | Status: DC
  Administered 2016-01-09 – 2016-01-13 (×5): 10 mg via ORAL
  Filled 2016-01-08 (×6): qty 1

## 2016-01-08 MED ORDER — AMLODIPINE BESYLATE 10 MG PO TABS
10.0000 mg | ORAL_TABLET | Freq: Every day | ORAL | Status: DC
  Administered 2016-01-09 – 2016-01-13 (×5): 10 mg via ORAL
  Filled 2016-01-08 (×6): qty 1

## 2016-01-08 MED ORDER — ASPIRIN EC 81 MG PO TBEC
81.0000 mg | DELAYED_RELEASE_TABLET | Freq: Every day | ORAL | Status: DC
  Administered 2016-01-09 – 2016-01-13 (×5): 81 mg via ORAL
  Filled 2016-01-08 (×6): qty 1

## 2016-01-08 MED ORDER — FERROUS SULFATE 325 (65 FE) MG PO TABS
325.0000 mg | ORAL_TABLET | Freq: Two times a day (BID) | ORAL | Status: DC
  Administered 2016-01-09 – 2016-01-13 (×8): 325 mg via ORAL
  Filled 2016-01-08 (×9): qty 1

## 2016-01-08 MED ORDER — SODIUM CHLORIDE 0.9 % IV BOLUS (SEPSIS)
1000.0000 mL | Freq: Once | INTRAVENOUS | Status: AC
  Administered 2016-01-08: 1000 mL via INTRAVENOUS

## 2016-01-08 MED ORDER — SODIUM CHLORIDE 0.9 % IV SOLN
INTRAVENOUS | Status: DC | PRN
  Administered 2016-01-08: 21:00:00 via INTRAVENOUS

## 2016-01-08 MED ORDER — ENOXAPARIN SODIUM 30 MG/0.3ML ~~LOC~~ SOLN
30.0000 mg | Freq: Every day | SUBCUTANEOUS | Status: DC
  Administered 2016-01-09 – 2016-01-10 (×2): 30 mg via SUBCUTANEOUS
  Filled 2016-01-08 (×2): qty 0.3

## 2016-01-08 MED ORDER — ONDANSETRON 4 MG PO TBDP: 8.0000 mg | ORAL_TABLET | Freq: Three times a day (TID) | ORAL | Status: DC | PRN

## 2016-01-08 MED ORDER — INSULIN ASPART 100 UNIT/ML ~~LOC~~ SOLN
0.0000 [IU] | Freq: Every day | SUBCUTANEOUS | Status: DC
  Administered 2016-01-09: 3 [IU] via SUBCUTANEOUS
  Administered 2016-01-09: 2 [IU] via SUBCUTANEOUS
  Administered 2016-01-10 – 2016-01-11 (×2): 4 [IU] via SUBCUTANEOUS
  Administered 2016-01-12: 3 [IU] via SUBCUTANEOUS

## 2016-01-08 MED ORDER — PROPOFOL 10 MG/ML IV BOLUS
INTRAVENOUS | Status: DC | PRN
  Administered 2016-01-08 (×2): 20 mg via INTRAVENOUS

## 2016-01-08 MED ORDER — LIDOCAINE 2% (20 MG/ML) 5 ML SYRINGE
INTRAMUSCULAR | Status: DC | PRN
  Administered 2016-01-08: 50 mg via INTRAVENOUS

## 2016-01-08 MED ORDER — LIDOCAINE 2% (20 MG/ML) 5 ML SYRINGE
INTRAMUSCULAR | Status: AC
  Filled 2016-01-08: qty 5

## 2016-01-08 MED ORDER — PROPOFOL 10 MG/ML IV BOLUS
INTRAVENOUS | Status: AC
  Filled 2016-01-08: qty 20

## 2016-01-08 SURGICAL SUPPLY — 15 items
BAG URO CATCHER STRL LF (MISCELLANEOUS) ×3 IMPLANT
CATH FOLEY 2WAY SLVR 30CC 16FR (CATHETERS) ×2 IMPLANT
CATH INTERMIT  6FR 70CM (CATHETERS) IMPLANT
CLOTH BEACON ORANGE TIMEOUT ST (SAFETY) ×3 IMPLANT
GLOVE BIOGEL M 8.0 STRL (GLOVE) ×3 IMPLANT
GOWN STRL REUS W/ TWL XL LVL3 (GOWN DISPOSABLE) ×1 IMPLANT
GOWN STRL REUS W/TWL LRG LVL3 (GOWN DISPOSABLE) ×3 IMPLANT
GOWN STRL REUS W/TWL XL LVL3 (GOWN DISPOSABLE) ×3
GUIDEWIRE ANG ZIPWIRE 038X150 (WIRE) IMPLANT
GUIDEWIRE STR DUAL SENSOR (WIRE) ×3 IMPLANT
MANIFOLD NEPTUNE II (INSTRUMENTS) ×3 IMPLANT
PACK CYSTO (CUSTOM PROCEDURE TRAY) ×3 IMPLANT
STENT URET 6FRX24 CONTOUR (STENTS) ×2 IMPLANT
TUBING CONNECTING 10 (TUBING) ×2 IMPLANT
TUBING CONNECTING 10' (TUBING) ×1

## 2016-01-08 NOTE — Progress Notes (Signed)
Pharmacy Antibiotic Note  Marcia [name] is a 81 y.o. female admitted on 01/08/2016 with UTI. Pt has chronic indwelling foley catheter that was changed yesterday.  Pharmacy has been consulted for cefepime dosing.  Plan: Cefepime 2gm IV x 1 given in ED then start Cefepime 1gm IV q24h for CrCl <84ms/min Follow renal function, cultures, clinical course  Height: '5\' 5"'$  (165.1 cm) Weight: 157 lb (71.2 kg) IBW/kg (Calculated) : 57  Temp (24hrs), Avg:99.7 F (37.6 C), Min:98.6 F (37 C), Max:100.8 F (38.2 C)   Recent Labs Lab 01/06/16 2055 01/08/16 1711 01/08/16 1719  WBC 11.7* 11.7*  --   CREATININE 1.04* 1.71*  --   LATICACIDVEN  --   --  2.08*    Estimated Creatinine Clearance: 26.4 mL/min (by C-G formula based on SCr of 1.71 mg/dL (H)).    Allergies  Allergen Reactions  . Amoxicillin Nausea And Vomiting  . Cefaclor Nausea And Vomiting  . Hydrocodone Nausea And Vomiting  . Sulfa Antibiotics     " legs give out"    Antimicrobials this admission: 11/22 cefepime >>   Microbiology results: 11/22 BCx: sent 11/22 UCx: sent Thank you for allowing pharmacy to be a part of this patient's care.  EDolly RiasRPh 01/08/2016, 7:04 PM Pager [phone]

## 2016-01-08 NOTE — ED Triage Notes (Signed)
Per EMS, patient was brought here Monday and given 1000 mL NS and felt better. Family called out today due to similar symptoms. Patient has altered mental status since yesterday morning. Patient received 200 mL NS en route with EMS Patient is from home.

## 2016-01-08 NOTE — Anesthesia Preprocedure Evaluation (Signed)
Anesthesia Evaluation  Patient identified by MRN, date of birth, ID band Patient awake    Reviewed: Allergy & Precautions, NPO status , Patient's Chart, lab work & pertinent test results  Airway Mallampati: II  TM Distance: >3 FB Neck ROM: Full    Dental  (+) Teeth Intact   Pulmonary former smoker,    breath sounds clear to auscultation       Cardiovascular  Rhythm:Regular Rate:Normal     Neuro/Psych    GI/Hepatic   Endo/Other  diabetes  Renal/GU      Musculoskeletal   Abdominal   Peds  Hematology   Anesthesia Other Findings   Reproductive/Obstetrics                             Anesthesia Physical Anesthesia Plan  ASA: III and emergent  Anesthesia Plan: MAC   Post-op Pain Management:    Induction: Intravenous  Airway Management Planned: Natural Airway and Simple Face Mask  Additional Equipment:   Intra-op Plan:   Post-operative Plan:   Informed Consent: I have reviewed the patients History and Physical, chart, labs and discussed the procedure including the risks, benefits and alternatives for the proposed anesthesia with the patient or authorized representative who has indicated his/her understanding and acceptance.     Plan Discussed with: CRNA and Anesthesiologist  Anesthesia Plan Comments:         Anesthesia Quick Evaluation

## 2016-01-08 NOTE — Anesthesia Postprocedure Evaluation (Signed)
Anesthesia Post Note  Patient: Marcia [name]  Procedure(s) Performed: Procedure(s) (LRB): CYSTOSCOPY WITH STENT PLACEMENT (Right)  Patient location during evaluation: PACU Anesthesia Type: MAC Level of consciousness: lethargic and confused Pain management: pain level controlled Vital Signs Assessment: post-procedure vital signs reviewed and stable Respiratory status: spontaneous breathing, nonlabored ventilation and respiratory function stable Cardiovascular status: blood pressure returned to baseline Anesthetic complications: no    Last Vitals:  Vitals:   01/08/16 2028 01/08/16 2159  BP: 133/66 151/97  Pulse: 90 97  Resp: 21 13  Temp: 38.9 C 38.6 C    Last Pain:  Vitals:   01/08/16 2028  TempSrc: Rectal                 Ranessa Kosta COKER

## 2016-01-08 NOTE — ED Notes (Signed)
Patient transported to CT 

## 2016-01-08 NOTE — ED Provider Notes (Signed)
[facility] DEPT Provider Note   CSN: [id number] Arrival date & time: 01/08/16  1608     History   Chief Complaint Chief Complaint  Patient presents with  . Altered Mental Status    HPI Marcia [name] is a 81 y.o. female with a past medical history significant for diabetes, dementia, hyperlipidemia, hypothyroidism, lung cancer, Foley catheter dependence and normal pressure hydrocephalus with recent diagnosis of kidney stone who presents with altered mental status and fever. Patient is committed by her daughter and spouse who report that patient was seen in this facility several days ago for similar complaints of altered mental status. At that time, patient was evaluated and had resolution in symptoms after rehydration. Patient had urinalysis and culture sent. Patient was found to have kidney stone on imaging. Patient to home after she was feeling better however, over the last 2 days, patient has had continued worsening mental status. Family says that the patient is minimally verbal and is somnolent. They report this is new for her. Therefore she is not eating and drinking and is tender in her abdomen. They report the patient has had nausea, vomiting, and diarrhea. They say the patient has not had a cough but has had some chills. Patient is febrile on arrival with a temperature of 101.5. Report her urine has appeared to have new sediment after it was replaced yesterday.   Most of the history was obtained by family because patient is altered.  The history is provided by the patient, a relative, the spouse and medical records. The history is limited by the condition of the patient. No language interpreter was used.  Altered Mental Status   This is a new problem. The current episode started 2 days ago. The problem has been gradually worsening. Associated symptoms include confusion and somnolence. Risk factors include a recent illness (? UTI). Her past medical history is significant for dementia.  Her past medical history does not include head trauma.    Past Medical History:  Diagnosis Date  . Cancer ([place])   . Carcinoma ([place])    lung cell-stage1  . Colonic polyp   . Diabetes mellitus   . Goiter    with hypothyrodism  . Hyperlipemia   . Lymph node disorder 10R,11R   excision  . Memory loss   . Neuropathy ([facility])    peripheral  . Osteopenia   . Rheumatoid arthritis(714.0)   . Wears glasses    reading    Patient Active Problem List   Diagnosis Date Noted  . UTI (lower urinary tract infection) 07/11/2015  . Acute encephalopathy 07/11/2015  . Dementia 07/11/2015  . Diabetes ([place]) 07/11/2015  . Hypokalemia 07/11/2015  . Acute blood loss anemia 07/11/2015  . Hyperglycemia 07/11/2015  . Hydrocephalus 07/11/2015  . NPH (normal pressure hydrocephalus) 07/11/2015  . Rheumatoid arthritis involving both shoulders with positive rheumatoid factor ([place]) 05/23/2015  . Malignant neoplasm of upper lobe of left lung ([place]) 05/23/2015  . Subcortical vascular dementia 05/23/2015  . Malignant neoplasm of upper lobe of right lung ([place]) 04/16/2015  . Type II or unspecified type diabetes mellitus with unspecified complication, uncontrolled 06/14/2012  . Vascular dementia, uncomplicated 29/79/8921  . Cancer of right lung ([place]) 10/08/2011    Past Surgical History:  Procedure Laterality Date  . CATARACT EXTRACTION Bilateral   . CESAREAN SECTION    . FOOT SURGERY Right    x3  . LESION REMOVAL Right 09/13/2013   Procedure: EXCISION OF RIGHT LEG SQUAMOUS CELL CARCINOMA/RIGHT ARM LESION  WITH ACELL;  Surgeon: Theodoro Kos, DO;  Location: [facility];  Service: Plastics;  Laterality: Right;  . LUNG SURGERY Right 2006   recurrance 2009,lower lobe superior segment,resection:2007  . LUNG SURGERY Right    secondary lower lobe margin, excision,no tumor indentified  . WRIST SURGERY Right     OB History    No data available       Home Medications    Prior to Admission  medications   Medication Sig Start Date End Date Taking? Authorizing Provider  ALPRAZolam Duanne Moron) 0.5 MG tablet Take 1 tablet by mouth daily as needed for anxiety. 12/20/15   Historical Provider, MD  amLODipine (NORVASC) 10 MG tablet Take 1 tablet (10 mg total) by mouth daily. 07/15/15   Belkys A Regalado, MD  aspirin EC 81 MG tablet Take 81 mg by mouth daily.    Historical Provider, MD  atorvastatin (LIPITOR) 40 MG tablet Take 40 mg by mouth daily. 03/25/12   Historical Provider, MD  Calcium Carbonate-Vitamin D (CALCIUM-VITAMIN D) 500-200 MG-UNIT tablet Take 1 tablet by mouth 2 (two) times daily.    Historical Provider, MD  docusate sodium (COLACE) 100 MG capsule Take 100-200 mg by mouth daily.    Historical Provider, MD  donepezil (ARICEPT) 23 MG TABS tablet TAKE 1 TABLET (23 MG TOTAL) BY MOUTH AT BEDTIME. 12/16/15   Asencion Partridge Dohmeier, MD  ezetimibe (ZETIA) 10 MG tablet Take 10 mg by mouth daily.    Historical Provider, MD  ferrous sulfate 325 (65 FE) MG tablet Take 1 tablet (325 mg total) by mouth 2 (two) times daily with a meal. 07/15/15   Belkys A Regalado, MD  HUMALOG MIX 75/25 KWIKPEN (75-25) 100 UNIT/ML Kwikpen Inject 12-30 Units into the skin 2 (two) times daily as needed (high blood sugar). Sliding scale. 12/18/15   Historical Provider, MD  LORazepam (ATIVAN) 0.5 MG tablet Take 1 tablet (0.5 mg total) by mouth 2 (two) times daily as needed. 11/21/15   Ward Givens, NP  Melatonin 5 MG TABS Take 10 mg by mouth at bedtime.     Historical Provider, MD  memantine (NAMENDA) 10 MG tablet TAKE 1 TABLET BY MOUTH TWO TIMES DAILY 11/25/15   Larey Seat, MD  mirtazapine (REMERON) 7.5 MG tablet Take 7.5 mg by mouth at bedtime.    Historical Provider, MD  ondansetron (ZOFRAN ODT) 8 MG disintegrating tablet Take 1 tablet (8 mg total) by mouth every 8 (eight) hours as needed for nausea or vomiting. 01/07/16   Jola Schmidt, MD  polyethylene glycol [facility] / Floria Raveling) packet Take 17 g by mouth 2 (two) times  daily. Patient taking differently: Take 17 g by mouth daily as needed for mild constipation.  07/15/15   Belkys A Regalado, MD  predniSONE (DELTASONE) 5 MG tablet Take 5 mg by mouth daily.    Historical Provider, MD  sodium phosphate (FLEET) enema Place 1 enema rectally daily as needed (constipation). follow package directions    Historical Provider, MD  tamsulosin (FLOMAX) 0.4 MG CAPS capsule Take 1 capsule (0.4 mg total) by mouth daily. 07/15/15   Belkys A Regalado, MD  Vitamin D, Ergocalciferol, (DRISDOL) 50000 UNITS CAPS capsule Take 1 capsule by mouth once a week. 07/23/13   Historical Provider, MD    Family History No family history on file.  Social History Social History  Substance Use Topics  . Smoking status: Former Smoker    Quit date: 01/16/2005  . Smokeless tobacco: Never Used  . Alcohol use No  Allergies   Amoxicillin; Cefaclor; Hydrocodone; and Sulfa antibiotics   Review of Systems Review of Systems  Unable to perform ROS: Mental status change  Constitutional: Positive for chills and fever.  HENT: Negative for congestion.   Respiratory: Negative for cough and shortness of breath.   Cardiovascular: Negative for chest pain.  Gastrointestinal: Positive for abdominal pain, diarrhea, nausea and vomiting.  Genitourinary: Positive for flank pain.  Musculoskeletal: Positive for back pain.  Neurological: Negative for headaches.  Psychiatric/Behavioral: Positive for confusion.     Physical Exam Updated Vital Signs BP 146/58 (BP Location: Right Arm)   Pulse 99   Temp 100.8 F (38.2 C) (Rectal)   Resp 20   Ht '5\' 5"'$  (1.651 m)   Wt 157 lb (71.2 kg)   SpO2 93%   BMI 26.13 kg/m   Physical Exam  Constitutional: She appears well-developed and well-nourished. She appears lethargic. No distress.  HENT:  Head: Normocephalic and atraumatic.  Mouth/Throat: Oropharynx is clear and moist. No oropharyngeal exudate.  Eyes: Conjunctivae and EOM are normal. Pupils are equal,  round, and reactive to light.  Neck: Normal range of motion. Neck supple.  Cardiovascular: Regular rhythm.  Tachycardia present.   No murmur heard. Pulmonary/Chest: Effort normal and breath sounds normal. No stridor. No respiratory distress. She has no wheezes. She exhibits no tenderness.  Abdominal: Soft. Normal appearance and bowel sounds are normal. There is tenderness in the suprapubic area. There is CVA tenderness.    Musculoskeletal: She exhibits no edema.  Neurological: She appears lethargic. She is disoriented. She displays no tremor. No sensory deficit. GCS eye subscore is 4. GCS verbal subscore is 5. GCS motor subscore is 6.  Patient is somnolent and difficult to evaluate neurologically. Patient is following all commands and denies changes in sensation, strength,. Patient will not participate for coordination exam.  Skin: Skin is warm and dry. Capillary refill takes less than 2 seconds. No rash noted. No erythema.  Nursing note and vitals reviewed.    ED Treatments / Results  Labs (all labs ordered are listed, but only abnormal results are displayed) Labs Reviewed  BLOOD CULTURE ID PANEL (REFLEXED) - Abnormal; Notable for the following:       Result Value   Enterobacteriaceae species DETECTED (*)    Proteus species DETECTED (*)    All other components within normal limits  COMPREHENSIVE METABOLIC PANEL - Abnormal; Notable for the following:    Glucose, Bld 281 (*)    BUN 24 (*)    Creatinine, Ser 1.71 (*)    Calcium 8.4 (*)    Total Protein 6.3 (*)    Albumin 2.5 (*)    ALT 13 (*)    GFR calc non Af Amer 27 (*)    GFR calc Af Amer 32 (*)    All other components within normal limits  CBC - Abnormal; Notable for the following:    WBC 11.7 (*)    All other components within normal limits  URINALYSIS, ROUTINE W REFLEX MICROSCOPIC (NOT AT [facility]) - Abnormal; Notable for the following:    APPearance TURBID (*)    Hgb urine dipstick MODERATE (*)    Ketones, ur 40 (*)     Protein, ur 100 (*)    Nitrite POSITIVE (*)    Leukocytes, UA LARGE (*)    All other components within normal limits  URINE MICROSCOPIC-ADD ON - Abnormal; Notable for the following:    Squamous Epithelial / LPF 0-5 (*)    Bacteria, UA  MANY (*)    All other components within normal limits  BASIC METABOLIC PANEL - Abnormal; Notable for the following:    Potassium 3.1 (*)    Glucose, Bld 173 (*)    Creatinine, Ser 1.05 (*)    Calcium 7.9 (*)    GFR calc non Af Amer 49 (*)    GFR calc Af Amer 57 (*)    All other components within normal limits  CBC - Abnormal; Notable for the following:    Hemoglobin 11.4 (*)    HCT 34.9 (*)    All other components within normal limits  GLUCOSE, CAPILLARY - Abnormal; Notable for the following:    Glucose-Capillary 263 (*)    All other components within normal limits  GLUCOSE, CAPILLARY - Abnormal; Notable for the following:    Glucose-Capillary 154 (*)    All other components within normal limits  CBG MONITORING, ED - Abnormal; Notable for the following:    Glucose-Capillary 318 (*)    All other components within normal limits  I-STAT CG4 LACTIC ACID, ED - Abnormal; Notable for the following:    Lactic Acid, Venous 2.08 (*)    All other components within normal limits  I-STAT CG4 LACTIC ACID, ED - Abnormal; Notable for the following:    Lactic Acid, Venous 3.01 (*)    All other components within normal limits  CULTURE, BLOOD (ROUTINE X 2)  CULTURE, BLOOD (ROUTINE X 2)  URINE CULTURE  URINE CULTURE  LIPASE, BLOOD  TSH  LACTIC ACID, PLASMA  GLUCOSE, CAPILLARY    EKG  EKG Interpretation None       Radiology Ct Abdomen Pelvis Wo Contrast  Result Date: 01/08/2016 CLINICAL DATA:  Abdominal pain. EXAM: CT ABDOMEN AND PELVIS WITHOUT CONTRAST TECHNIQUE: Multidetector CT imaging of the abdomen and pelvis was performed following the standard protocol without IV contrast. COMPARISON:  CT abdomen pelvis 01/06/2016 FINDINGS: Lower chest: Small  right pleural effusion. Bibasilar dependent atelectasis. No nodules or masses. Hepatobiliary: Unremarkable noncontrast appearance of the liver. There is vicarious excretion of contrast within the gallbladder. Pancreas: The pancreas is atrophic and largely fatty replaced. Spleen: Normal Adrenal glands: Normal Urinary Tract: --Right kidney: The right kidney in ureter are completely obstructed at the ureterovesical junction by the previously identified 6 mm calculus. There is excreted IV contrast filling the right renal pelvis and right ureter, both of which are dilated. There is a striated nephrogram appearance of the right kidney. --Left kidney: No hydronephrosis or perinephric stranding. No nephrolithiasis. No obstructing ureteral stones. --Urinary bladder: Decompressed by Foley catheter. Stomach/Bowel: No dilated loops of bowel. No evidence of colonic or enteric inflammation. No fluid collection within the abdomen. Vascular/Lymphatic: There is atherosclerotic calcification of the non aneurysmal abdominal aorta. No abdominal or pelvic lymphadenopathy. Reproductive: Normal uterus.  No adnexal mass. Musculoskeletal. Intra medullary left femoral rod. Multilevel lumbar osteophytosis. No bony spinal canal stenosis. IMPRESSION: 1. Severe right obstructive uropathy with complete obstruction of the right ureter at the ureterovesical junction secondary to presence of 6 mm stone. Severe right hydroureteronephrosis with excreted contrast from the scan of 2 days ago filling the renal pelvis and right ureter. 2. Striated nephrogram appearance of the right kidney is likely secondary to acute kidney injury caused by the above described ureteral obstruction. 3. Small right pleural effusion. Electronically Signed   By: Ulyses Jarred M.D.   On: 01/08/2016 19:00   Dg Chest 2 View  Result Date: 01/08/2016 CLINICAL DATA:  Altered mental status with fever history  of lung cancer EXAM: CHEST  2 VIEW COMPARISON:  07/12/2015, CT chest  04/03/2014, radiograph 11/30/2007 FINDINGS: Postsurgical changes of the right upper lung zone with mild volume loss and shift of mediastinal contents to the right. Stellate density in this region, slightly increased on AP view. A wedge-shaped posterior focal opacity is unchanged. No acute consolidations or effusions. Stable chronic pleural thickening on the right. Stable cardiomediastinal silhouette with borderline to mild cardiomegaly. Atherosclerosis. No pneumothorax. IMPRESSION: 1. Postsurgical changes of the right upper lung. Slight increased density in the region of the surgical sutures/presumed scarring on AP view, cannot exclude acute infiltrate or other airspace abnormality in the region. 2. Mild cardiomegaly with mild central vascular congestion. Electronically Signed   By: Donavan Foil M.D.   On: 01/08/2016 18:21    Procedures Procedures (including critical care time)  CRITICAL CARE Performed by: Gwenyth Allegra Tegeler Total critical care time: 35 minutes Critical care time was exclusive of separately billable procedures and treating other patients. Critical care was necessary to treat or prevent imminent or life-threatening deterioration. Critical care was time spent personally by me on the following activities: development of treatment plan with patient and/or surrogate as well as nursing, discussions with consultants, evaluation of patient's response to treatment, examination of patient, obtaining history from patient or surrogate, ordering and performing treatments and interventions, ordering and review of laboratory studies, ordering and review of radiographic studies, pulse oximetry and re-evaluation of patient's condition.   Medications Ordered in ED Medications  ceFEPIme (MAXIPIME) 1 g in dextrose 5 % 50 mL IVPB (not administered)  donepezil (ARICEPT) tablet 23 mg (23 mg Oral Not Given 01/09/16 0058)  memantine (NAMENDA) tablet 10 mg (10 mg Oral Given 01/09/16 0050)  aspirin EC  tablet 81 mg (not administered)  amLODipine (NORVASC) tablet 10 mg (not administered)  calcium-vitamin D (OSCAL WITH D) 500-200 MG-UNIT per tablet 1 tablet (1 tablet Oral Not Given 01/09/16 0050)  ferrous sulfate tablet 325 mg (not administered)  polyethylene glycol (MIRALAX / GLYCOLAX) packet 17 g (not administered)  atorvastatin (LIPITOR) tablet 40 mg (not administered)  predniSONE (DELTASONE) tablet 5 mg (not administered)  ezetimibe (ZETIA) tablet 10 mg (not administered)  enoxaparin (LOVENOX) injection 30 mg (not administered)  0.9 %  sodium chloride infusion ( Intravenous New Bag/Given 01/09/16 0927)  acetaminophen (TYLENOL) tablet 650 mg ( Oral See Alternative 01/09/16 1003)    Or  acetaminophen (TYLENOL) suppository 650 mg (650 mg Rectal Given 01/09/16 1003)  traMADol (ULTRAM) tablet 50 mg (not administered)  ondansetron (ZOFRAN) tablet 4 mg (not administered)    Or  ondansetron (ZOFRAN) injection 4 mg (not administered)  insulin aspart (novoLOG) injection 0-15 Units (3 Units Subcutaneous Given 01/09/16 0933)  insulin aspart (novoLOG) injection 0-5 Units (3 Units Subcutaneous Given 01/09/16 0049)  potassium chloride SA (K-DUR,KLOR-CON) CR tablet 40 mEq (not administered)  ceFEPIme (MAXIPIME) 2 g in dextrose 5 % 50 mL IVPB (0 g Intravenous Stopped 01/08/16 2029)  sodium chloride 0.9 % bolus 1,000 mL (0 mLs Intravenous Stopped 01/08/16 1916)    And  sodium chloride 0.9 % bolus 1,000 mL (0 mLs Intravenous Stopped 01/08/16 2030)    And  sodium chloride 0.9 % bolus 250 mL (250 mLs Intravenous New Bag/Given 01/08/16 1838)     Initial Impression / Assessment and Plan / ED Course  I have reviewed the triage vital signs and the nursing notes.  Pertinent labs & imaging results that were available during my care of the patient were reviewed by  me and considered in my medical decision making (see chart for details).  Clinical Course     Marcia [name] is a 81 y.o. female with a past  medical history significant for diabetes, dementia, hyperlipidemia, hypothyroidism, lung cancer, Foley catheter dependence and normal pressure hydrocephalus with recent diagnosis of kidney stone who presents with altered mental status and fever.  History and exam are seen above. Patient is arousable with loud voices and painful stimulation. Patient is able to follow all commands and is moving all extremities. Patient's lungs are clear. Patient has tender abdomen in the suprapubic area. Patient also has bilateral CVA tenderness. Patient is altered and is unable to answer orientation questions. Patient has baseline dementia but they report that this is worsened mental status.   As patient is febrile, tachycardic, and has altered mental status with concern for UTI, patient was made a code sepsis and broad-spectrum antibiotics were initiated. Urine cultures were reviewed from several days ago and patient had gram-negative rods. The sensitivities had not completed. As this is a new Foley catheter placed yesterday, urine was sent.  Given patient's continued abdominal pain and recent kidney stone diagnosis, CT repeated without contrast. Patient found to have complete obstruction of ureter with persistent contrast from a scan several days ago. There is severe right hydronephrosis. Given this finding of UTI, large stone with hydronephrosis, urology was quickly called for management recommendations.   Laboratory testing showed acute kidney injury. Lactic acid elevated at 3.01. Mild leukocytosis of 11.7. Blood cultures obtained and fluids were also given.  Urology saw the patient and will take her emergently to the operating room for stent placement. She'll be admitted after operating room. She was taken to the OR in stable condition.   Final Clinical Impressions(s) / ED Diagnoses   Final diagnoses:  Acute pyonephrosis     Clinical Impression: 1. Acute pyonephrosis     Disposition: Taken to OR with  Urology    Gwenyth Allegra Tegeler, MD 01/09/16 1233

## 2016-01-08 NOTE — ED Notes (Signed)
Provider in room via consult

## 2016-01-08 NOTE — ED Notes (Signed)
1 attempt to collect labs unsuccessful.

## 2016-01-08 NOTE — ED Notes (Signed)
Writer attempted to draw blood work, unsuccessful attempt, Probation officer asked lead tech Hassell Done) to attempt blood work

## 2016-01-08 NOTE — Consult Note (Signed)
Urology Consult   Physician requesting consult: C Tegeler, MD  Reason for consult: infected, obstructing right ureteral calculus  History of Present Illness: Marcia [name] is a 81 y.o. female who was admitted urgently at this time for management of an obstructed , infected right ureteral stone.  She presented to the emergency room a couple of days ago without any evidence of infection.  With hydration, her status improved.  She was not complaining of significant pain.  However, CT scan revealed a 6 millimeter right distal ureteral stone with proximal hydroureteronephrosis.  There was ureteral thickening consistent with long-standing presence of the stone.  She was appropriately treated and went home.  However, over the past day and a half to 2 days, she has had declining condition, and has been obtunded Roberto Scales unresponsive.  There has been some fever and chills.  She is had minimal by mouth intake.  She presented again to the emergency room for further evaluation.  CT scan revealed persistent obstruction from this right ureteral stone, with retained IV contrast proximal to the right ureteral stone.  Urologic consultation is requested.  The patient does have baseline dementia.  She has been basically bedridden and with a Foley catheter since orthopedic surgery in May of this year.    Past Medical History:  Diagnosis Date  . Cancer ([place])   . Carcinoma ([place])    lung cell-stage1  . Colonic polyp   . Diabetes mellitus   . Goiter    with hypothyrodism  . Hyperlipemia   . Lymph node disorder 10R,11R   excision  . Memory loss   . Neuropathy ([facility])    peripheral  . Osteopenia   . Rheumatoid arthritis(714.0)   . Wears glasses    reading    Past Surgical History:  Procedure Laterality Date  . CATARACT EXTRACTION Bilateral   . CESAREAN SECTION    . FOOT SURGERY Right    x3  . LESION REMOVAL Right 09/13/2013   Procedure: EXCISION OF RIGHT LEG SQUAMOUS CELL CARCINOMA/RIGHT ARM LESION WITH  ACELL;  Surgeon: Theodoro Kos, DO;  Location: [facility];  Service: Plastics;  Laterality: Right;  . LUNG SURGERY Right 2006   recurrance 2009,lower lobe superior segment,resection:2007  . LUNG SURGERY Right    secondary lower lobe margin, excision,no tumor indentified  . WRIST SURGERY Right      Current Hospital Medications: Scheduled Meds: Continuous Infusions: . [START ON 01/09/2016] ceFEPime (MAXIPIME) IV     PRN Meds:.  Allergies:  Allergies  Allergen Reactions  . Amoxicillin Nausea And Vomiting  . Cefaclor Nausea And Vomiting  . Hydrocodone Nausea And Vomiting  . Sulfa Antibiotics     " legs give out"    No family history on file.  Social History:  reports that she quit smoking about 10 years ago. She has never used smokeless tobacco. She reports that she does not drink alcohol or use drugs.  ROS: Level V caveat  Physical Exam:  Vital signs in last 24 hours: Temp:  [98.6 F (37 C)-102.1 F (38.9 C)] 102.1 F (38.9 C) (11/22 2028) Pulse Rate:  [87-104] 90 (11/22 2028) Resp:  [18-30] 21 (11/22 2028) BP: (133-155)/(58-100) 133/66 (11/22 2028) SpO2:  [89 %-100 %] 99 % (11/22 2028) Weight:  [71.2 kg (157 lb)] 71.2 kg (157 lb) (11/22 1625) General:  Obtunded, No acute distress HEENT: Normocephalic, atraumatic. MM dry Neck: No JVD or lymphadenopathy Cardiovascular: Regular rate and rhythm Lungs: Normal inspiratory and expiratory excursion Abdomen:  no abdominal masses Extremities: No edema Neurologic: Patient is obtunded  Laboratory Data:   Recent Labs  01/06/16 2055 01/08/16 1711  WBC 11.7* 11.7*  HGB 13.5 12.7  HCT 39.5 38.9  PLT 207 211     Recent Labs  01/06/16 2055 01/08/16 1711  NA 141 138  K 3.3* 3.7  CL 105 101  GLUCOSE 139* 281*  BUN 18 24*  CALCIUM 9.2 8.4*  CREATININE 1.04* 1.71*     Results for orders placed or performed during the hospital encounter of 01/08/16 (from the past 24 hour(s))  CBG monitoring, ED      Status: Abnormal   Collection Time: 01/08/16  4:36 PM  Result Value Ref Range   Glucose-Capillary 318 (H) 65 - 99 mg/dL  Urinalysis, Routine w reflex microscopic     Status: Abnormal   Collection Time: 01/08/16  5:00 PM  Result Value Ref Range   Color, Urine YELLOW YELLOW   APPearance TURBID (A) CLEAR   Specific Gravity, Urine 1.016 1.005 - 1.030   pH 6.5 5.0 - 8.0   Glucose, UA NEGATIVE NEGATIVE mg/dL   Hgb urine dipstick MODERATE (A) NEGATIVE   Bilirubin Urine NEGATIVE NEGATIVE   Ketones, ur 40 (A) NEGATIVE mg/dL   Protein, ur 100 (A) NEGATIVE mg/dL   Nitrite POSITIVE (A) NEGATIVE   Leukocytes, UA LARGE (A) NEGATIVE  Urine microscopic-add on     Status: Abnormal   Collection Time: 01/08/16  5:00 PM  Result Value Ref Range   Squamous Epithelial / LPF 0-5 (A) NONE SEEN   WBC, UA TOO NUMEROUS TO COUNT 0 - 5 WBC/hpf   RBC / HPF 6-30 0 - 5 RBC/hpf   Bacteria, UA MANY (A) NONE SEEN  Comprehensive metabolic panel     Status: Abnormal   Collection Time: 01/08/16  5:11 PM  Result Value Ref Range   Sodium 138 135 - 145 mmol/L   Potassium 3.7 3.5 - 5.1 mmol/L   Chloride 101 101 - 111 mmol/L   CO2 25 22 - 32 mmol/L   Glucose, Bld 281 (H) 65 - 99 mg/dL   BUN 24 (H) 6 - 20 mg/dL   Creatinine, Ser 1.71 (H) 0.44 - 1.00 mg/dL   Calcium 8.4 (L) 8.9 - 10.3 mg/dL   Total Protein 6.3 (L) 6.5 - 8.1 g/dL   Albumin 2.5 (L) 3.5 - 5.0 g/dL   AST 27 15 - 41 U/L   ALT 13 (L) 14 - 54 U/L   Alkaline Phosphatase 87 38 - 126 U/L   Total Bilirubin 1.1 0.3 - 1.2 mg/dL   GFR calc non Af Amer 27 (L) >60 mL/min   GFR calc Af Amer 32 (L) >60 mL/min   Anion gap 12 5 - 15  CBC     Status: Abnormal   Collection Time: 01/08/16  5:11 PM  Result Value Ref Range   WBC 11.7 (H) 4.0 - 10.5 K/uL   RBC 4.34 3.87 - 5.11 MIL/uL   Hemoglobin 12.7 12.0 - 15.0 g/dL   HCT 38.9 36.0 - 46.0 %   MCV 89.6 78.0 - 100.0 fL   MCH 29.3 26.0 - 34.0 pg   MCHC 32.6 30.0 - 36.0 g/dL   RDW 14.6 11.5 - 15.5 %   Platelets 211  150 - 400 K/uL  Lipase, blood     Status: None   Collection Time: 01/08/16  5:11 PM  Result Value Ref Range   Lipase 28 11 - 51 U/L  TSH  Status: None   Collection Time: 01/08/16  5:11 PM  Result Value Ref Range   TSH 1.257 0.350 - 4.500 uIU/mL  I-Stat CG4 Lactic Acid, ED     Status: Abnormal   Collection Time: 01/08/16  5:19 PM  Result Value Ref Range   Lactic Acid, Venous 2.08 (HH) 0.5 - 1.9 mmol/L   Recent Results (from the past 240 hour(s))  Urine culture     Status: Abnormal (Preliminary result)   Collection Time: 01/06/16  9:07 PM  Result Value Ref Range Status   Specimen Description URINE, RANDOM  Final   Special Requests NONE  Final   Culture >=100,000 COLONIES/mL GRAM NEGATIVE RODS (A)  Final   Report Status PENDING  Incomplete    Renal Function:  Recent Labs  01/06/16 2055 01/08/16 1711  CREATININE 1.04* 1.71*   Estimated Creatinine Clearance: 26.4 mL/min (by C-G formula based on SCr of 1.71 mg/dL (H)).  Radiologic Imaging: Ct Abdomen Pelvis Wo Contrast  Result Date: 01/08/2016 CLINICAL DATA:  Abdominal pain. EXAM: CT ABDOMEN AND PELVIS WITHOUT CONTRAST TECHNIQUE: Multidetector CT imaging of the abdomen and pelvis was performed following the standard protocol without IV contrast. COMPARISON:  CT abdomen pelvis 01/06/2016 FINDINGS: Lower chest: Small right pleural effusion. Bibasilar dependent atelectasis. No nodules or masses. Hepatobiliary: Unremarkable noncontrast appearance of the liver. There is vicarious excretion of contrast within the gallbladder. Pancreas: The pancreas is atrophic and largely fatty replaced. Spleen: Normal Adrenal glands: Normal Urinary Tract: --Right kidney: The right kidney in ureter are completely obstructed at the ureterovesical junction by the previously identified 6 mm calculus. There is excreted IV contrast filling the right renal pelvis and right ureter, both of which are dilated. There is a striated nephrogram appearance of the  right kidney. --Left kidney: No hydronephrosis or perinephric stranding. No nephrolithiasis. No obstructing ureteral stones. --Urinary bladder: Decompressed by Foley catheter. Stomach/Bowel: No dilated loops of bowel. No evidence of colonic or enteric inflammation. No fluid collection within the abdomen. Vascular/Lymphatic: There is atherosclerotic calcification of the non aneurysmal abdominal aorta. No abdominal or pelvic lymphadenopathy. Reproductive: Normal uterus.  No adnexal mass. Musculoskeletal. Intra medullary left femoral rod. Multilevel lumbar osteophytosis. No bony spinal canal stenosis. IMPRESSION: 1. Severe right obstructive uropathy with complete obstruction of the right ureter at the ureterovesical junction secondary to presence of 6 mm stone. Severe right hydroureteronephrosis with excreted contrast from the scan of 2 days ago filling the renal pelvis and right ureter. 2. Striated nephrogram appearance of the right kidney is likely secondary to acute kidney injury caused by the above described ureteral obstruction. 3. Small right pleural effusion. Electronically Signed   By: Ulyses Jarred M.D.   On: 01/08/2016 19:00   Dg Chest 2 View  Result Date: 01/08/2016 CLINICAL DATA:  Altered mental status with fever history of lung cancer EXAM: CHEST  2 VIEW COMPARISON:  07/12/2015, CT chest 04/03/2014, radiograph 11/30/2007 FINDINGS: Postsurgical changes of the right upper lung zone with mild volume loss and shift of mediastinal contents to the right. Stellate density in this region, slightly increased on AP view. A wedge-shaped posterior focal opacity is unchanged. No acute consolidations or effusions. Stable chronic pleural thickening on the right. Stable cardiomediastinal silhouette with borderline to mild cardiomegaly. Atherosclerosis. No pneumothorax. IMPRESSION: 1. Postsurgical changes of the right upper lung. Slight increased density in the region of the surgical sutures/presumed scarring on AP  view, cannot exclude acute infiltrate or other airspace abnormality in the region. 2. Mild cardiomegaly with mild  central vascular congestion. Electronically Signed   By: Donavan Foil M.D.   On: 01/08/2016 18:21   Ct Abdomen Pelvis W Contrast  Result Date: 01/06/2016 CLINICAL DATA:  Left lower quadrant pain EXAM: CT ABDOMEN AND PELVIS WITH CONTRAST TECHNIQUE: Multidetector CT imaging of the abdomen and pelvis was performed using the standard protocol following bolus administration of intravenous contrast. CONTRAST:  134m ISOVUE-300 IOPAMIDOL (ISOVUE-300) INJECTION 61% COMPARISON:  Ultrasound 10/28/2010 FINDINGS: Lower chest: There is no significant pleural effusion. There is mild subpleural fibrosis within the posterior lower lobes along with linear scarring within the bilateral lung bases. Coronary artery calcifications. Small right posterior diaphragmatic hernia with protrusion of fat into the lower posterior chest. Hepatobiliary: No biliary dilatation. Probable focal fatty infiltration near the falciform ligament. No calcified gallstones. Pancreas: Pancreas is atrophic but otherwise unremarkable. Spleen: Normal in size without focal abnormality. Adrenals/Urinary Tract: Bilateral adrenal glands are within normal limits. Left kidney is within normal limits. There is moderate hydronephrosis and dilated extrarenal pelvis on the right. Moderate-to-marked right hydroureter, secondary to a 6 mm stone in the distal right ureter, just above the right UVJ. No left-sided hydronephrosis. The bladder contains a Foley catheter and appears slightly thick walled. Stomach/Bowel: There is no dilatation of the stomach. There is no dilated small bowel. Mild rectal wall thickening. Vascular/Lymphatic: Dense atherosclerosis of non aneurysmal abdominal aorta and branch vessels. No significant retroperitoneal adenopathy. Reproductive: Status post hysterectomy. No adnexal masses. Other: Minimal presacral edema and soft tissue  stranding. Mild hazy attenuation within the central mesentery. No free air. Musculoskeletal: Degenerative changes, most notable at L5-S1. Status post left proximal femur intra medullary rod. IMPRESSION: 1. Moderate right hydronephrosis and marked right dilated extra renal pelvis and hydroureter secondary to a 6 mm stone in the distal right ureter, just above the right UVJ. No left hydronephrosis. 2. Small right posterior diaphragmatic hernia which contains fat. 3. Nonspecific mild hazy attenuation within the central mesenteries, possibly related to mild mesenteric edema. 4. Mild rectal asymmetric thickening, could be due to proctitis or possible rectal mass, recommend correlation with physical examination. Electronically Signed   By: KDonavan FoilM.D.   On: 01/06/2016 22:29    I independently reviewed the above imaging studies.  Impression/Assessment:  Infected, obstructing right ureteral stone  Plan:  Urgent cystoscopy, right double-J stent placement.  I discussed the procedure with the patient's family, as well as the need for post procedural admission and IV antibiotic administration.  We will proceed urgently.

## 2016-01-08 NOTE — Transfer of Care (Signed)
Immediate Anesthesia Transfer of Care Note  Patient: Marcia [name]  Procedure(s) Performed: Procedure(s): CYSTOSCOPY WITH STENT PLACEMENT (Right)  Patient Location: PACU  Anesthesia Type:MAC  Level of Consciousness: awake, alert  and confused  Airway & Oxygen Therapy: Patient Spontanous Breathing and Patient connected to nasal cannula oxygen  Post-op Assessment: Report given to RN and Post -op Vital signs reviewed and stable  Post vital signs: Reviewed and stable  Last Vitals:  Vitals:   01/08/16 2014 01/08/16 2028  BP:  133/66  Pulse:  90  Resp:  21  Temp: 38.9 C 38.9 C    Last Pain:  Vitals:   01/08/16 2028  TempSrc: Rectal         Complications: No apparent anesthesia complications

## 2016-01-08 NOTE — ED Notes (Signed)
2 sets of blood cultures obtained prior to antibiotic administration

## 2016-01-08 NOTE — ED Notes (Signed)
Bed: MB34 Expected date:  Expected time:  Means of arrival:  Comments: EMS- 81yo F, AMS

## 2016-01-08 NOTE — H&P (Signed)
History and Physical:    Marcia [name]   HFW:[id number] DOB: [birth year] DOA: 01/08/2016  Referring MD/provider: Franchot Gallo, MD PCP: Reginia Naas, MD   Patient coming from: Home.  Chief Complaint: Nausea, vomiting, diarrhea.  History of Present Illness:   Marcia [name] is an 81 y.o. female with a PMH of recently seen nephrolithiasis, advanced dementia, small cell as well as adenocarcinoma of the lung diagnosed 03/2007 status post surgery/chemotherapy, diabetes, hypothyroidism and rheumatoid arthritis who presented to the ED with a chief complaint of a 2 day history of nausea, vomiting, and diarrhea. Of note, the patient has a chronic indwelling Foley catheter secondary to urinary retention. According to family, she is essentially bedridden and has 24 hour care given by her husband and a caregiver. She was seen in the ED on 01/06/16 with reports of vomiting and diarrhea, and at the time of that evaluation, the stone seen on imaging was thought to be chronic. Urine cultures were sent, and the patient was discharged home with Zofran after receiving IV fluids. Recommendations were for her to follow-up with urology as an outpatient. In the ensuing time, the patient's condition declined and she essentially became unresponsive with fever and chills. Urine cultures that were sent on 01/06/16 are growing greater than 100,000 colonies of gram-negative rods with the speciation pending. The patient is seen postoperatively and is sedated and unable to provide any additional information.  ED Course:  Patient's creatinine was noted to have risen from 1.04 ---> 1.71 over the past 2 days. The patient had a repeat CT of the abdomen which showed moderate right hydronephrosis and marked right dilated extrarenal pelvis and hydroureter secondary to a 6 mm stone in the distal right ureter. Dr. Diona Fanti was subsequently consulted, and he took her for urgent cystoscopy with placement of a right double-J  stent.  ROS:   Review of Systems  Unable to perform ROS: Dementia    Past Medical History:   Past Medical History:  Diagnosis Date  . Cancer ([place])   . Carcinoma ([place])    lung cell-stage1  . Colonic polyp   . Diabetes mellitus   . Goiter    with hypothyrodism  . Hyperlipemia   . Lymph node disorder 10R,11R   excision  . Memory loss   . Neuropathy ([facility])    peripheral  . Osteopenia   . Rheumatoid arthritis(714.0)   . Wears glasses    reading    Past Surgical History:   Past Surgical History:  Procedure Laterality Date  . CATARACT EXTRACTION Bilateral   . CESAREAN SECTION    . FOOT SURGERY Right    x3  . LESION REMOVAL Right 09/13/2013   Procedure: EXCISION OF RIGHT LEG SQUAMOUS CELL CARCINOMA/RIGHT ARM LESION WITH ACELL;  Surgeon: Theodoro Kos, DO;  Location: [facility];  Service: Plastics;  Laterality: Right;  . LUNG SURGERY Right 2006   recurrance 2009,lower lobe superior segment,resection:2007  . LUNG SURGERY Right    secondary lower lobe margin, excision,no tumor indentified  . WRIST SURGERY Right     Social History:   Social History   Social History  . Marital status: Married    Spouse name: Wynetta Emery  . Number of children: 3  . Years of education: College   Occupational History  . Not on file.   Social History Main Topics  . Smoking status: Former Smoker    Quit date: 01/16/2005  . Smokeless tobacco: Never Used  . Alcohol use  No  . Drug use: No  . Sexual activity: Yes    Birth control/ protection: None   Other Topics Concern  . Not on file   Social History Narrative   Pt lives at home with her spouse Wynetta Emery)   Patient has three children.   Patient is retired.   Patient has a college education.   Patient is right-handed.   Caffeine Use-2 cups daily    Allergies   Amoxicillin; Cefaclor; Hydrocodone; and Sulfa antibiotics  Family history:   Records reviewed. Positive for cancer, tuberculosis, sarcoidosis.  Also her  mother had cancer.  Current Medications:   Prior to Admission medications   Medication Sig Start Date End Date Taking? Authorizing Provider  ALPRAZolam Duanne Moron) 0.5 MG tablet Take 1 tablet by mouth daily as needed for anxiety. 12/20/15  Yes Historical Provider, MD  amLODipine (NORVASC) 10 MG tablet Take 1 tablet (10 mg total) by mouth daily. 07/15/15  Yes Belkys A Regalado, MD  aspirin EC 81 MG tablet Take 81 mg by mouth daily.   Yes Historical Provider, MD  atorvastatin (LIPITOR) 40 MG tablet Take 40 mg by mouth daily. 03/25/12  Yes Historical Provider, MD  Calcium Carbonate-Vitamin D (CALCIUM-VITAMIN D) 500-200 MG-UNIT tablet Take 1 tablet by mouth 2 (two) times daily.   Yes Historical Provider, MD  docusate sodium (COLACE) 100 MG capsule Take 100-200 mg by mouth daily.   Yes Historical Provider, MD  donepezil (ARICEPT) 23 MG TABS tablet TAKE 1 TABLET (23 MG TOTAL) BY MOUTH AT BEDTIME. 12/16/15  Yes Asencion Partridge Dohmeier, MD  ezetimibe (ZETIA) 10 MG tablet Take 10 mg by mouth daily.   Yes Historical Provider, MD  ferrous sulfate 325 (65 FE) MG tablet Take 1 tablet (325 mg total) by mouth 2 (two) times daily with a meal. 07/15/15  Yes Belkys A Regalado, MD  HUMALOG MIX 75/25 KWIKPEN (75-25) 100 UNIT/ML Kwikpen Inject 12-30 Units into the skin 2 (two) times daily as needed (high blood sugar). Sliding scale. 12/18/15  Yes Historical Provider, MD  LORazepam (ATIVAN) 0.5 MG tablet Take 1 tablet (0.5 mg total) by mouth 2 (two) times daily as needed. 11/21/15  Yes Ward Givens, NP  Melatonin 5 MG TABS Take 10 mg by mouth at bedtime.    Yes Historical Provider, MD  memantine (NAMENDA) 10 MG tablet TAKE 1 TABLET BY MOUTH TWO TIMES DAILY 11/25/15  Yes Larey Seat, MD  mirtazapine (REMERON) 7.5 MG tablet Take 7.5 mg by mouth at bedtime.   Yes Historical Provider, MD  ondansetron (ZOFRAN ODT) 8 MG disintegrating tablet Take 1 tablet (8 mg total) by mouth every 8 (eight) hours as needed for nausea or vomiting.  01/07/16  Yes Jola Schmidt, MD  polyethylene glycol [facility] / GLYCOLAX) packet Take 17 g by mouth 2 (two) times daily. Patient taking differently: Take 17 g by mouth daily as needed for mild constipation.  07/15/15  Yes Belkys A Regalado, MD  predniSONE (DELTASONE) 5 MG tablet Take 5 mg by mouth daily.   Yes Historical Provider, MD  sodium phosphate (FLEET) enema Place 1 enema rectally daily as needed (constipation). follow package directions   Yes Historical Provider, MD  tamsulosin (FLOMAX) 0.4 MG CAPS capsule Take 1 capsule (0.4 mg total) by mouth daily. 07/15/15  Yes Belkys A Regalado, MD  Vitamin D, Ergocalciferol, (DRISDOL) 50000 UNITS CAPS capsule Take 1 capsule by mouth once a week. 07/23/13  Yes Historical Provider, MD    Physical Exam:   Vitals:  01/08/16 2200 01/08/16 2215 01/08/16 2230 01/08/16 2245  BP:  154/75 151/76 148/56  Pulse: 96 94 96 93  Resp: 13 (!) '27 23 24  '$ Temp:      TempSrc:      SpO2: 96% 100% 100% 100%  Weight:      Height:         Physical Exam: Blood pressure 148/56, pulse 93, temperature 101.4 F (38.6 C), resp. rate 24, height '5\' 5"'$  (1.651 m), weight 71.2 kg (157 lb), SpO2 100 %. Gen: No acute distress.Sedated, postoperative. Head: Normocephalic, atraumatic. Eyes: Pupils equal, round and reactive to light. Extraocular movements intact.  Sclerae nonicteric. No lid lag. Mouth: Oropharynx reveals dry mucous membranes.  Neck: Supple, no thyromegaly, no lymphadenopathy, no jugular venous distention. Chest: Lungs are clear to auscultation with good air movement. No rales, rhonchi or wheezes.  CV: Heart sounds are regular, mildly tachycardic with an S1, S2. No murmurs, rubs, clicks, or gallops.  Abdomen: Soft, nontender, nondistended with normal active bowel sounds. No hepatosplenomegaly or palpable masses. Extremities: Extremities are without clubbing, edema, or cyanosis. Pedal pulses 1+ Skin: Warm and dry. Bilateral pressure sores on ankles. Neuro:  Sedated, post-op. Psych: Unable to assess.   Data Review:    Labs: Basic Metabolic Panel:  Recent Labs Lab 01/06/16 2055 01/08/16 1711  NA 141 138  K 3.3* 3.7  CL 105 101  CO2 25 25  GLUCOSE 139* 281*  BUN 18 24*  CREATININE 1.04* 1.71*  CALCIUM 9.2 8.4*   Liver Function Tests:  Recent Labs Lab 01/06/16 2055 01/08/16 1711  AST 19 27  ALT 12* 13*  ALKPHOS 82 87  BILITOT 1.4* 1.1  PROT 5.9* 6.3*  ALBUMIN 2.8* 2.5*    Recent Labs Lab 01/06/16 2055 01/08/16 1711  LIPASE 21 28   No results for input(s): AMMONIA in the last 168 hours. CBC:  Recent Labs Lab 01/06/16 2055 01/08/16 1711  WBC 11.7* 11.7*  NEUTROABS 9.9*  --   HGB 13.5 12.7  HCT 39.5 38.9  MCV 87.2 89.6  PLT 207 211   Cardiac Enzymes: No results for input(s): CKTOTAL, CKMB, CKMBINDEX, TROPONINI in the last 168 hours.  BNP (last 3 results) No results for input(s): PROBNP in the last 8760 hours. CBG:  Recent Labs Lab 01/08/16 1636  GLUCAP 318*    Urinalysis    Component Value Date/Time   COLORURINE YELLOW 01/08/2016 1700   APPEARANCEUR TURBID (A) 01/08/2016 1700   LABSPEC 1.016 01/08/2016 1700   LABSPEC 1.005 09/21/2007 1410   PHURINE 6.5 01/08/2016 1700   GLUCOSEU NEGATIVE 01/08/2016 1700   HGBUR MODERATE (A) 01/08/2016 1700   BILIRUBINUR NEGATIVE 01/08/2016 1700   BILIRUBINUR Negative 09/21/2007 1410   KETONESUR 40 (A) 01/08/2016 1700   PROTEINUR 100 (A) 01/08/2016 1700   UROBILINOGEN 0.2 09/01/2011 1746   NITRITE POSITIVE (A) 01/08/2016 1700   LEUKOCYTESUR LARGE (A) 01/08/2016 1700   LEUKOCYTESUR Moderate 09/21/2007 1410      Radiographic Studies: Ct Abdomen Pelvis Wo Contrast  Result Date: 01/08/2016 CLINICAL DATA:  Abdominal pain. EXAM: CT ABDOMEN AND PELVIS WITHOUT CONTRAST TECHNIQUE: Multidetector CT imaging of the abdomen and pelvis was performed following the standard protocol without IV contrast. COMPARISON:  CT abdomen pelvis 01/06/2016 FINDINGS: Lower  chest: Small right pleural effusion. Bibasilar dependent atelectasis. No nodules or masses. Hepatobiliary: Unremarkable noncontrast appearance of the liver. There is vicarious excretion of contrast within the gallbladder. Pancreas: The pancreas is atrophic and largely fatty replaced. Spleen: Normal Adrenal glands:  Normal Urinary Tract: --Right kidney: The right kidney in ureter are completely obstructed at the ureterovesical junction by the previously identified 6 mm calculus. There is excreted IV contrast filling the right renal pelvis and right ureter, both of which are dilated. There is a striated nephrogram appearance of the right kidney. --Left kidney: No hydronephrosis or perinephric stranding. No nephrolithiasis. No obstructing ureteral stones. --Urinary bladder: Decompressed by Foley catheter. Stomach/Bowel: No dilated loops of bowel. No evidence of colonic or enteric inflammation. No fluid collection within the abdomen. Vascular/Lymphatic: There is atherosclerotic calcification of the non aneurysmal abdominal aorta. No abdominal or pelvic lymphadenopathy. Reproductive: Normal uterus.  No adnexal mass. Musculoskeletal. Intra medullary left femoral rod. Multilevel lumbar osteophytosis. No bony spinal canal stenosis. IMPRESSION: 1. Severe right obstructive uropathy with complete obstruction of the right ureter at the ureterovesical junction secondary to presence of 6 mm stone. Severe right hydroureteronephrosis with excreted contrast from the scan of 2 days ago filling the renal pelvis and right ureter. 2. Striated nephrogram appearance of the right kidney is likely secondary to acute kidney injury caused by the above described ureteral obstruction. 3. Small right pleural effusion. Electronically Signed   By: Ulyses Jarred M.D.   On: 01/08/2016 19:00   Dg Chest 2 View  Result Date: 01/08/2016 CLINICAL DATA:  Altered mental status with fever history of lung cancer EXAM: CHEST  2 VIEW COMPARISON:   07/12/2015, CT chest 04/03/2014, radiograph 11/30/2007 FINDINGS: Postsurgical changes of the right upper lung zone with mild volume loss and shift of mediastinal contents to the right. Stellate density in this region, slightly increased on AP view. A wedge-shaped posterior focal opacity is unchanged. No acute consolidations or effusions. Stable chronic pleural thickening on the right. Stable cardiomediastinal silhouette with borderline to mild cardiomegaly. Atherosclerosis. No pneumothorax. IMPRESSION: 1. Postsurgical changes of the right upper lung. Slight increased density in the region of the surgical sutures/presumed scarring on AP view, cannot exclude acute infiltrate or other airspace abnormality in the region. 2. Mild cardiomegaly with mild central vascular congestion. Electronically Signed   By: Donavan Foil M.D.   On: 01/08/2016 18:21    EKG: Independently reviewed. Sinus rhythm at 99 beats per minute.   Assessment/Plan:   Principal Problem:   Acute pyonephrosis/nephrolithiasis/obstructive uropathy resulting in acute kidney injury/sepsis Initial presentation was concerning for sepsis. She received 4.5 L of IV fluids and urology urgently consulted. Patient was taken for urgent cystoscopy with placement of a right double-J stent. She was placed on Maxipime. Urine cultures and blood cultures sent. Lactic acid 2.08 on admission. Will repeat now to see if she is clearing this and continue to hydrate. She is hemodynamically stable. Monitor creatinine.  Active Problems:   Cancer of right lung ([place]) Stable, status post treatment in 2009.    Vascular dementia, uncomplicated Has [id number] caregiver support. Continue Aricept and Namenda.    Rheumatoid arthritis involving both shoulders with positive rheumatoid factor (HCC) Continue prednisone.    Diabetes ([place]) Insulin-dependent on Humalog mix 75/25 twice a day. Will place on moderate scale sliding scale for now.   Hyperlipidemia Continue  Lipitor and Zetia.   Attestation regarding necessity of inpatient status:   The appropriate admission status for this patient is INPATIENT. Inpatient status is judged to be reasonable and necessary in order to provide the required intensity of service to ensure the patient's safety. The patient's presenting symptoms, physical exam findings, and initial radiographic and laboratory data in the context of their chronic comorbidities is felt  to place them at high risk for further clinical deterioration. Furthermore, it is not anticipated that the patient will be medically stable for discharge from the hospital within 2 midnights of admission. The following factors support the admission status of inpatient.    The patient's presenting symptoms include nausea, vomiting, diarrhea, altered mental status.  The worrisome physical exam findings include obtundation.  The initial radiographic and laboratory data are worrisome because of sepsis complicated by obstructive uropathy, chronic immunosuppression in the setting of gram-negative rod + urine culture from prior visit, .  The chronic co-morbidities include advanced dementia, chronic indwelling Foley catheter for urinary retention, rheumatoid arthritis on chronic immunosuppressive therapy with steroids, insulin-dependent diabetes.   * I certify that at the point of admission it is my clinical judgment that the patient will require inpatient hospital care spanning beyond 2 midnights from the point of admission due to high intensity of service, high risk for further deterioration and high frequency of surveillance required.*   Other information:   DVT prophylaxis: Lovenox ordered. Code Status: Full code. Family Communication: No family at the bedside. Disposition Plan: Home when sepsis clears inpatient medically stable. Consults called: Urology, Dr. Diona Fanti. Admission status: Inpatient.   The medical decision making on this patient was of high  complexity and the patient is at high risk for clinical deterioration, therefore this is a level 3 visit.    Trestin Vences [facility] Pager [phone] Cell: [phone]   If 7PM-7AM, please contact night-coverage [email or web address] Password [facility] 01/08/2016, 11:25 PM

## 2016-01-08 NOTE — ED Notes (Signed)
I attempted to collect labs and just did not see anyway to collect from.  Patient arms are bruise up really bad.

## 2016-01-08 NOTE — Addendum Note (Signed)
Addendum  created 01/08/16 2315 by Marijo Conception, CRNA   Charge Capture section accepted, Visit diagnoses modified

## 2016-01-08 NOTE — Op Note (Signed)
Diagnosis: Right ureteral stone with hydronephrosis  Postoperative diagnosis: Same  Principal procedure: Cystoscopy, placement of right double-J stent (24 centimeter by 6 French contour without tether)  Surgeon: Zaylin Runco  Anesthesia: Monitored anesthesia care  Specimens: Right ureteral urine specimen for culture  Complications: None  Estimated blood loss: None  Indications: 81 year old female with an obstructing, infected right ureteral stone.  Urologic consultation was requested by the emergency room physician Once the patient was found to have an infection/sepsis associated with this obstructing stone.  I have evaluated the patient, and spoken with the patient's family, recommending urgent placement of a right double-J stent.  The patient's family agrees with this procedure.  Description of procedure: The patient was properly identified in the holding area.  Her right side was marked.  She received a cephalosporin in the emergency room.  She was taken to the operating room and placed in the dorsolithotomy position.  Genitalia and perineum were prepped and draped.  Proper timeout was performed after the patient was made comfortable with IV sedation.  A 23 French panendoscope was advanced in her bladder which was briefly inspected and found to be normal.  Orifices are somewhat hard to find with the 30 lens, the so, the 70 lens was used and the right ureteral orifice was seen.  It was cannulated with an open-ended catheter.  A sensor-tip guidewire was advanced through this, up into the right kidney.  The open-ended catheter was advanced into the mid ureter.  A urine sample was taken and sent for culture.  The sensor-tip guidewire was then replaced.  It was seen to curl in the upper pole calyceal system of the open-ended catheter was then removed.  The guidewire was left in place.  A 24 centimeter by 6 French contour double-J stent with tether removed was then placed using fluoroscopic and  cystoscopic guidance.  An excellent proximal and distal curls were seen.  After removing the guidewire.  At this point, the scope was removed.  The bladder was drained with an indwelling 72 French Foley catheter which was hooked to dependent drainage.  Per the patient tolerated procedure well.  She was awakened and taken to the PACU in stable condition.  She will be admitted for management of her infection/sepsis.

## 2016-01-09 DIAGNOSIS — L8990 Pressure ulcer of unspecified site, unspecified stage: Secondary | ICD-10-CM | POA: Insufficient documentation

## 2016-01-09 DIAGNOSIS — N183 Chronic kidney disease, stage 3 (moderate): Secondary | ICD-10-CM

## 2016-01-09 LAB — GLUCOSE, CAPILLARY
GLUCOSE-CAPILLARY: 149 mg/dL — AB (ref 65–99)
GLUCOSE-CAPILLARY: 154 mg/dL — AB (ref 65–99)
GLUCOSE-CAPILLARY: 263 mg/dL — AB (ref 65–99)
Glucose-Capillary: 99 mg/dL (ref 65–99)
Glucose-Capillary: 251 mg/dL — ABNORMAL HIGH (ref 65–99)

## 2016-01-09 LAB — URINE CULTURE

## 2016-01-09 LAB — LACTIC ACID, PLASMA: Lactic Acid, Venous: 1.6 mmol/L (ref 0.5–1.9)

## 2016-01-09 LAB — CBC
HEMATOCRIT: 34.9 % — AB (ref 36.0–46.0)
HEMOGLOBIN: 11.4 g/dL — AB (ref 12.0–15.0)
MCH: 29 pg (ref 26.0–34.0)
MCHC: 32.7 g/dL (ref 30.0–36.0)
MCV: 88.8 fL (ref 78.0–100.0)
PLATELETS: 206 10*3/uL (ref 150–400)
RBC: 3.93 MIL/uL (ref 3.87–5.11)
RDW: 14.6 % (ref 11.5–15.5)
WBC: 8.6 10*3/uL (ref 4.0–10.5)

## 2016-01-09 LAB — BASIC METABOLIC PANEL
ANION GAP: 7 (ref 5–15)
BUN: 18 mg/dL (ref 6–20)
CHLORIDE: 110 mmol/L (ref 101–111)
CO2: 25 mmol/L (ref 22–32)
Calcium: 7.9 mg/dL — ABNORMAL LOW (ref 8.9–10.3)
Creatinine, Ser: 1.05 mg/dL — ABNORMAL HIGH (ref 0.44–1.00)
GFR calc Af Amer: 57 mL/min — ABNORMAL LOW (ref >60)
GFR calc non Af Amer: 49 mL/min — ABNORMAL LOW (ref >60)
GLUCOSE: 173 mg/dL — AB (ref 65–99)
POTASSIUM: 3.1 mmol/L — AB (ref 3.5–5.1)
Sodium: 142 mmol/L (ref 135–145)

## 2016-01-09 LAB — BLOOD CULTURE ID PANEL (REFLEXED)
ACINETOBACTER BAUMANNII: NOT DETECTED
CANDIDA PARAPSILOSIS: NOT DETECTED
CANDIDA TROPICALIS: NOT DETECTED
CARBAPENEM RESISTANCE: NOT DETECTED
Candida albicans: NOT DETECTED
Candida glabrata: NOT DETECTED
Candida krusei: NOT DETECTED
Enterobacter cloacae complex: NOT DETECTED
Enterobacteriaceae species: DETECTED — AB
Enterococcus species: NOT DETECTED
Escherichia coli: NOT DETECTED
HAEMOPHILUS INFLUENZAE: NOT DETECTED
KLEBSIELLA PNEUMONIAE: NOT DETECTED
Klebsiella oxytoca: NOT DETECTED
Listeria monocytogenes: NOT DETECTED
Neisseria meningitidis: NOT DETECTED
PROTEUS SPECIES: DETECTED — AB
Pseudomonas aeruginosa: NOT DETECTED
STAPHYLOCOCCUS AUREUS BCID: NOT DETECTED
STREPTOCOCCUS SPECIES: NOT DETECTED
Serratia marcescens: NOT DETECTED
Staphylococcus species: NOT DETECTED
Streptococcus agalactiae: NOT DETECTED
Streptococcus pneumoniae: NOT DETECTED
Streptococcus pyogenes: NOT DETECTED

## 2016-01-09 MED ORDER — POTASSIUM CHLORIDE CRYS ER 20 MEQ PO TBCR
40.0000 meq | EXTENDED_RELEASE_TABLET | Freq: Once | ORAL | Status: DC
  Filled 2016-01-09: qty 2

## 2016-01-09 MED ORDER — LORAZEPAM 0.5 MG PO TABS
0.5000 mg | ORAL_TABLET | Freq: Two times a day (BID) | ORAL | Status: DC | PRN
  Administered 2016-01-09 – 2016-01-13 (×7): 0.5 mg via ORAL
  Filled 2016-01-09 (×7): qty 1

## 2016-01-09 MED ORDER — POTASSIUM CHLORIDE 20 MEQ/15ML (10%) PO SOLN
40.0000 meq | Freq: Once | ORAL | Status: AC
  Administered 2016-01-09: 40 meq via ORAL
  Filled 2016-01-09: qty 30

## 2016-01-09 MED ORDER — SODIUM CHLORIDE 0.9 % IV SOLN
3.0000 g | Freq: Four times a day (QID) | INTRAVENOUS | Status: DC
  Administered 2016-01-09 – 2016-01-11 (×8): 3 g via INTRAVENOUS
  Filled 2016-01-09 (×8): qty 3

## 2016-01-09 NOTE — Progress Notes (Signed)
Patient ID: Marcia [name], female   DOB: Nov 12, 1934, 81 y.o.   MRN: [id number]  PROGRESS NOTE    Marcia [name]  FGB:[id number] DOB: 14-Oct-1934 DOA: 01/08/2016  PCP: Reginia Naas, MD   Brief Narrative:  81 -year-old female with past medical history significant for advanced dementia, small cell adenocarcinoma of the lung diagnosed in 2009 status post surgery and chemotherapy, diabetes, hypothyroidism, rheumatoid arthritis. She presented to ED with 2 day history of nausea, vomiting and diarrhea. Patient has chronic indwelling Foley catheter secondary to urinary retention. Per family report, patient is essentially bedridden and has 24-hour care at home. Patient was seen in ED 01/06/2016 with vomiting and diarrhea. Evaluation at that time revealed stone on imaging studies which was thought to be chronic. Urine culture was sent and patient was subsequently discharged home after receiving IV fluids. She was supposed to follow-up with urology on outpatient basis however patient's condition worsened at home and she subsequently came back with presentation of unresponsiveness in addition to fevers.  Repeat CT scan of the abdomen showed moderate right hydronephrosis and marked right dilated extrarenal pelvis and hydroureter secondary to 6 mm stone in the distal right ureter. Patient underwent urgent cystoscopy with placement of right double J stent. She was started on cefepime.   Assessment & Plan:   Principal Problem: Sepsis secondary to Proteus mirabilis and Klebsiella pneumonia secondary to acute pyelonephritis / leukocytosis / lactic acidosis - Sepsis criteria met on the admission with fever, tachycardia, tachypnea, hypoxia, leukocytosis and lactic acidosis Source of infection is acute pyelonephritis secondary to hydronephrosis - Urine culture on November 20 is growing Proteus mirabilis and Klebsiella pneumonia species - Continue cefepime - Please note patient also has blood cultures on this  admission which so far show gram-negative rods, will follow-up on final report - White blood cell count and lactic acid normalized  Active Problems:   Cancer of right lung (HCC) - Status post surgery and chemotherapy    Vascular dementia, uncomplicated - Continue Aricept and Namenda    Rheumatoid arthritis involving both shoulders with positive rheumatoid factor (HCC) - Continue low-dose prednisone 5 mg daily    Essential hypertension - Continue Norvasc 10 mg daily    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 - Patient with sliding scale insulin    Dyslipidemia associated with type 2 diabetes mellitus - Continue statin therapy and zetia     Chronic kidney disease stage III - Baseline creatinine 6 months ago is 1.05 - Creatinine within baseline range on this admission    Hypokalemia - Likely from sepsis - Supplemented - Follow-up BMP in the morning    Pressure injury of skin, right lower extremity - Wound care consulted    DVT prophylaxis: Lovenox subQ  Code Status: full code  Family Communication: caregiver at the bedside this am  Disposition Plan: home likely in next 2-3 days if stable from GU perspective    Consultants:   GU  Procedures:   Double J stent placed 01/08/2016   Antimicrobials:   Cefepime 01/08/2016 -->   Subjective: No overnight events.   Objective: Vitals:   01/08/16 2230 01/08/16 2245 01/08/16 2329 01/09/16 0536  BP: 151/76 148/56 (!) 143/71 (!) 121/58  Pulse: 96 93 99 (!) 101  Resp: _0 Temp:   99 F (37.2 C) 99.6 F (37.6 C)  TempSrc:   Axillary Axillary  SpO2: 100% 100% 99% 93%  Weight:   57.4 kg (126 lb 8.7 oz)  Height:   _0  (1.676 m)     Intake/Output Summary (Last 24 hours) at 01/09/16 1053 Last data filed at 01/09/16 1000  Gross per 24 hour  Intake          1438.33 ml  Output             1075 ml  Net           363.33 ml   Filed Weights   01/08/16 1625 01/08/16 2329    Weight: 71.2 kg (157 lb) 57.4 kg (126 lb 8.7 oz)    Examination:  General exam: Appears calm and comfortable  Respiratory system: Clear to auscultation. Respiratory effort normal. Cardiovascular system: S1 & S2 heard, RRR. No JVD, murmurs, rubs, gallops or clicks. No pedal edema. Gastrointestinal system: Abdomen is nondistended, soft and nontender. No organomegaly or masses felt. Normal bowel sounds heard. Central nervous system: Alert and oriented. No focal neurological deficits. Extremities: Symmetric 5 x 5 power. Skin: laceration on RLE, left heel pressure injury  Psychiatry: Mood & affect appropriate.   Data Reviewed: I have personally reviewed following labs and imaging studies  CBC:  Recent Labs Lab 01/06/16 2055 01/08/16 1711 01/09/16 0350  WBC 11.7* 11.7* 8.6  NEUTROABS 9.9*  --   --   HGB 13.5 12.7 11.4*  HCT 39.5 38.9 34.9*  MCV 87.2 89.6 88.8  PLT 207 211 881   Basic Metabolic Panel:  Recent Labs Lab 01/06/16 2055 01/08/16 1711 01/09/16 0350  NA 141 138 142  K 3.3* 3.7 3.1*  CL 105 101 110  CO2 _1 GLUCOSE 139* 281* 173*  BUN 18 24* 18  CREATININE 1.04* 1.71* 1.05*  CALCIUM 9.2 8.4* 7.9*   GFR: Estimated Creatinine Clearance: 39.4 mL/min (by C-G formula based on SCr of 1.05 mg/dL (H)). Liver Function Tests:  Recent Labs Lab 01/06/16 2055 01/08/16 1711  AST 19 27  ALT 12* 13*  ALKPHOS 82 87  BILITOT 1.4* 1.1  PROT 5.9* 6.3*  ALBUMIN 2.8* 2.5*    Recent Labs Lab 01/06/16 2055 01/08/16 1711  LIPASE 21 28   No results for input(s): AMMONIA in the last 168 hours. Coagulation Profile: No results for input(s): INR, PROTIME in the last 168 hours. Cardiac Enzymes: No results for input(s): CKTOTAL, CKMB, CKMBINDEX, TROPONINI in the last 168 hours. BNP (last 3 results) No results for input(s): PROBNP in the last 8760 hours. HbA1C: No results for input(s): HGBA1C in the last 72 hours. CBG:  Recent Labs Lab 01/08/16 1636  01/09/16 0040 01/09/16 0750  GLUCAP 318* 263* 154*   Lipid Profile: No results for input(s): CHOL, HDL, LDLCALC, TRIG, CHOLHDL, LDLDIRECT in the last 72 hours. Thyroid Function Tests:  Recent Labs  01/08/16 1711  TSH 1.257   Anemia Panel: No results for input(s): VITAMINB12, FOLATE, FERRITIN, TIBC, IRON, RETICCTPCT in the last 72 hours. Urine analysis:    Component Value Date/Time   COLORURINE YELLOW 01/08/2016 1700   APPEARANCEUR TURBID (A) 01/08/2016 1700   LABSPEC 1.016 01/08/2016 1700   LABSPEC 1.005 09/21/2007 1410   PHURINE 6.5 01/08/2016 1700   GLUCOSEU NEGATIVE 01/08/2016 1700   HGBUR MODERATE (A) 01/08/2016 1700   BILIRUBINUR NEGATIVE 01/08/2016 1700   BILIRUBINUR Negative 09/21/2007 1410   KETONESUR 40 (A) 01/08/2016 1700   PROTEINUR 100 (A) 01/08/2016 1700   UROBILINOGEN 0.2 09/01/2011 1746   NITRITE POSITIVE (A) 01/08/2016 1700   LEUKOCYTESUR LARGE (A) 01/08/2016 1700   LEUKOCYTESUR Moderate 09/21/2007 1410  Sepsis Labs: _0 (procalcitonin:4,lacticidven:4)   Urine culture     Status: Abnormal   Collection Time: 01/06/16  9:07 PM  Result Value Ref Range Status   Specimen Description URINE, RANDOM  Final   Special Requests NONE  Final   Culture (A)  Final   Report Status 01/09/2016 FINAL  Final   Organism ID, Bacteria KLEBSIELLA PNEUMONIAE (A)  Final   Organism ID, Bacteria PROTEUS MIRABILIS (A)  Final      Susceptibility   Klebsiella pneumoniae - MIC*    AMPICILLIN >=32 RESISTANT Resistant     CEFAZOLIN <=4 SENSITIVE Sensitive     CEFTRIAXONE <=1 SENSITIVE Sensitive     CIPROFLOXACIN <=0.25 SENSITIVE Sensitive     GENTAMICIN <=1 SENSITIVE Sensitive     IMIPENEM <=0.25 SENSITIVE Sensitive     NITROFURANTOIN 64 INTERMEDIATE Intermediate     TRIMETH/SULFA <=20 SENSITIVE Sensitive     AMPICILLIN/SULBACTAM 8 SENSITIVE Sensitive     PIP/TAZO <=4 SENSITIVE Sensitive     Extended ESBL NEGATIVE Sensitive     * >=100,000 COLONIES/mL KLEBSIELLA  PNEUMONIAE   Proteus mirabilis - MIC*    AMPICILLIN <=2 RESISTANT Resistant     CEFAZOLIN <=4 RESISTANT Resistant     CEFTRIAXONE <=1 RESISTANT Resistant     CIPROFLOXACIN <=0.25 SENSITIVE Sensitive     GENTAMICIN <=1 SENSITIVE Sensitive     IMIPENEM 4 SENSITIVE Sensitive     NITROFURANTOIN 128 RESISTANT Resistant     TRIMETH/SULFA <=20 SENSITIVE Sensitive     AMPICILLIN/SULBACTAM <=2 SENSITIVE Sensitive     PIP/TAZO <=4 SENSITIVE Sensitive     * >=100,000 COLONIES/mL PROTEUS MIRABILIS  Culture, blood (Routine x 2)     Status: None (Preliminary result)   Collection Time: 01/08/16  5:27 PM  Result Value Ref Range Status   Specimen Description BLOOD BLOOD RIGHT FOREARM  Final   Special Requests BOTTLES DRAWN AEROBIC AND ANAEROBIC 5CC  Final   Culture  Setup Time   Final    GRAM NEGATIVE RODS   Report Status PENDING  Incomplete      Radiology Studies: Ct Abdomen Pelvis Wo Contrast Result Date: 01/08/2016 1. Severe right obstructive uropathy with complete obstruction of the right ureter at the ureterovesical junction secondary to presence of 6 mm stone. Severe right hydroureteronephrosis with excreted contrast from the scan of 2 days ago filling the renal pelvis and right ureter. 2. Striated nephrogram appearance of the right kidney is likely secondary to acute kidney injury caused by the above described ureteral obstruction. 3. Small right pleural effusion. Electronically Signed   By: Ulyses Jarred M.D.   On: 01/08/2016 19:00   Dg Chest 2 View Result Date: 01/08/2016  1. Postsurgical changes of the right upper lung. Slight increased density in the region of the surgical sutures/presumed scarring on AP view, cannot exclude acute infiltrate or other airspace abnormality in the region. 2. Mild cardiomegaly with mild central vascular congestion. Electronically Signed   By: Donavan Foil M.D.   On: 01/08/2016 18:21   Ct Abdomen Pelvis W Contrast Result Date: 01/06/2016 1. Moderate right  hydronephrosis and marked right dilated extra renal pelvis and hydroureter secondary to a 6 mm stone in the distal right ureter, just above the right UVJ. No left hydronephrosis. 2. Small right posterior diaphragmatic hernia which contains fat. 3. Nonspecific mild hazy attenuation within the central mesenteries, possibly related to mild mesenteric edema. 4. Mild rectal asymmetric thickening, could be due to proctitis or possible rectal mass, recommend correlation with physical  examination. Electronically Signed   By: Donavan Foil M.D.   On: 01/06/2016 22:29      Scheduled Meds: . amLODipine  10 mg Oral Daily  . aspirin EC  81 mg Oral Daily  . atorvastatin  40 mg Oral Daily  . calcium-vitamin D  1 tablet Oral BID  . ceFEPime (MAXIPIME) IV  1 g Intravenous Q24H  . donepezil  23 mg Oral QHS  . enoxaparin (LOVENOX) injection  30 mg Subcutaneous Daily  . ezetimibe  10 mg Oral Daily  . ferrous sulfate  325 mg Oral BID WC  . insulin aspart  0-15 Units Subcutaneous TID WC  . insulin aspart  0-5 Units Subcutaneous QHS  . memantine  10 mg Oral BID  . potassium chloride  40 mEq Oral Once  . predniSONE  5 mg Oral Daily   Continuous Infusions: . sodium chloride 100 mL/hr at 01/09/16 0927     LOS: 1 day    Time spent: 25 minutes  Greater than 50% of the time spent on counseling and coordinating the care.   Leisa Lenz, MD [facility] Pager [phone]  If 7PM-7AM, please contact night-coverage [email or web address] Password [facility] 01/09/2016, 10:53 AM

## 2016-01-09 NOTE — Progress Notes (Addendum)
PHARMACY - PHYSICIAN COMMUNICATION CRITICAL VALUE ALERT - BLOOD CULTURE IDENTIFICATION (BCID)  Results for orders placed or performed during the hospital encounter of 01/08/16  Blood Culture ID Panel (Reflexed) (Collected: 01/08/2016  5:27 PM)  Result Value Ref Range   Enterococcus species NOT DETECTED NOT DETECTED   Listeria monocytogenes NOT DETECTED NOT DETECTED   Staphylococcus species NOT DETECTED NOT DETECTED   Staphylococcus aureus NOT DETECTED NOT DETECTED   Streptococcus species NOT DETECTED NOT DETECTED   Streptococcus agalactiae NOT DETECTED NOT DETECTED   Streptococcus pneumoniae NOT DETECTED NOT DETECTED   Streptococcus pyogenes NOT DETECTED NOT DETECTED   Acinetobacter baumannii NOT DETECTED NOT DETECTED   Enterobacteriaceae species DETECTED (A) NOT DETECTED   Enterobacter cloacae complex NOT DETECTED NOT DETECTED   Escherichia coli NOT DETECTED NOT DETECTED   Klebsiella oxytoca NOT DETECTED NOT DETECTED   Klebsiella pneumoniae NOT DETECTED NOT DETECTED   Proteus species DETECTED (A) NOT DETECTED   Serratia marcescens NOT DETECTED NOT DETECTED   Carbapenem resistance NOT DETECTED NOT DETECTED   Haemophilus influenzae NOT DETECTED NOT DETECTED   Neisseria meningitidis NOT DETECTED NOT DETECTED   Pseudomonas aeruginosa NOT DETECTED NOT DETECTED   Candida albicans NOT DETECTED NOT DETECTED   Candida glabrata NOT DETECTED NOT DETECTED   Candida krusei NOT DETECTED NOT DETECTED   Candida parapsilosis NOT DETECTED NOT DETECTED   Candida tropicalis NOT DETECTED NOT DETECTED    Name of physician (or Provider) Contacted: Dr. Charlies Silvers  Changes to prescribed antibiotics required: BCID algorithm suggests Ceftriaxone. However, urine culture from 01/06/16 + for Klebsiella pneumoniae and Proteus mirabilis. Proteus from urine cx resistant to Ceftriaxone and Cefepime. Discussed with MD-change antibiotics to Unasyn 3g IV q6h per available urine sensitivities and follow blood and urine  cultures drawn 01/08/16. Note, patient reports n/v with Amoxicillin, but MD ok to try Unasyn for this patient as not a true allergy.  Luiz Ochoa 01/09/2016  12:45 PM

## 2016-01-10 DIAGNOSIS — B9689 Other specified bacterial agents as the cause of diseases classified elsewhere: Secondary | ICD-10-CM

## 2016-01-10 LAB — CBC
HEMATOCRIT: 33.4 % — AB (ref 36.0–46.0)
HEMOGLOBIN: 11.1 g/dL — AB (ref 12.0–15.0)
MCH: 29.1 pg (ref 26.0–34.0)
MCHC: 33.2 g/dL (ref 30.0–36.0)
MCV: 87.4 fL (ref 78.0–100.0)
Platelets: 189 10*3/uL (ref 150–400)
RBC: 3.82 MIL/uL — AB (ref 3.87–5.11)
RDW: 14.6 % (ref 11.5–15.5)
WBC: 6.1 10*3/uL (ref 4.0–10.5)

## 2016-01-10 LAB — BASIC METABOLIC PANEL
ANION GAP: 7 (ref 5–15)
BUN: 12 mg/dL (ref 6–20)
CHLORIDE: 112 mmol/L — AB (ref 101–111)
CO2: 24 mmol/L (ref 22–32)
Calcium: 7.7 mg/dL — ABNORMAL LOW (ref 8.9–10.3)
Creatinine, Ser: 0.71 mg/dL (ref 0.44–1.00)
GFR calc Af Amer: >60 mL/min (ref >60)
GFR calc non Af Amer: >60 mL/min (ref >60)
GLUCOSE: 199 mg/dL — AB (ref 65–99)
Potassium: 3.4 mmol/L — ABNORMAL LOW (ref 3.5–5.1)
Sodium: 143 mmol/L (ref 135–145)

## 2016-01-10 LAB — GLUCOSE, CAPILLARY
GLUCOSE-CAPILLARY: 174 mg/dL — AB (ref 65–99)
GLUCOSE-CAPILLARY: 327 mg/dL — AB (ref 65–99)
Glucose-Capillary: 144 mg/dL — ABNORMAL HIGH (ref 65–99)
Glucose-Capillary: 242 mg/dL — ABNORMAL HIGH (ref 65–99)

## 2016-01-10 LAB — MRSA PCR SCREENING: MRSA BY PCR: NEGATIVE

## 2016-01-10 MED ORDER — ADULT MULTIVITAMIN W/MINERALS CH
1.0000 | ORAL_TABLET | Freq: Every day | ORAL | Status: DC
  Administered 2016-01-11 – 2016-01-13 (×3): 1 via ORAL
  Filled 2016-01-10 (×5): qty 1

## 2016-01-10 MED ORDER — ENSURE ENLIVE PO LIQD
237.0000 mL | Freq: Two times a day (BID) | ORAL | Status: DC
  Administered 2016-01-10 – 2016-01-13 (×5): 237 mL via ORAL

## 2016-01-10 MED ORDER — POTASSIUM CHLORIDE CRYS ER 20 MEQ PO TBCR
40.0000 meq | EXTENDED_RELEASE_TABLET | Freq: Once | ORAL | Status: AC
  Administered 2016-01-10: 40 meq via ORAL
  Filled 2016-01-10: qty 2

## 2016-01-10 MED ORDER — ENOXAPARIN SODIUM 40 MG/0.4ML ~~LOC~~ SOLN
40.0000 mg | Freq: Every day | SUBCUTANEOUS | Status: DC
  Administered 2016-01-11 – 2016-01-12 (×2): 40 mg via SUBCUTANEOUS
  Filled 2016-01-10 (×3): qty 0.4

## 2016-01-10 NOTE — Consult Note (Signed)
[place] Nurse wound consult note Reason for Consult: Chronic nonhealing full thickness wound to left lateral heel.  Seen at wound care center.  Currently treating with Iodoform, foam and kerlix every other day.  Right anterior lower leg with trauma wound.  MD applied steristrips and nonremovable dressing for one week.  (due to be changed 01/14/16). Dressing is clean, dry and intact.  Wound type:trauma Pressure Ulcer POA: Yes Measurement:LEft heel 1 cm x 0.5 cm x 1 cm  Wound MZT:AEWYB devitalized tissue is removed from wound bed.  Pink nongranulating wound bed.  Drainage (amount, consistency, odor) Minimal serosanguinous.  No odor.  Periwound:Peeling epithelium. Cleansed gently.  Dressing procedure/placement/frequency:Cleanse left lateral heel with NS and pat gently dry.  Fill wound depth with Iodoform packing strip.  Cover with foam dressing and kerlix.  Change every other day.  Began 01/10/16.   Nonremovable dressing (with steristrips) to right anterior lower leg.  To be changed Tuesday, 01/14/16.  Will not follow at this time.  Please re-consult if needed.  Domenic Moras RN BSN [place] Pager [phone]

## 2016-01-10 NOTE — Progress Notes (Signed)
Initial Nutrition Assessment  DOCUMENTATION CODES:   Severe malnutrition in context of acute illness/injury  INTERVENTION:  - Will order Ensure Enlive po BID, each supplement provides 350 kcal and 20 grams of protein - Will order Magic Cup with lunch, this supplement provides 290 kcal and 9 grams of protein - Will order daily multivitamin with minerals. - Continue to encourage PO intakes of meals and supplements. - RD will continue to monitor for additional nutrition-related needs.  NUTRITION DIAGNOSIS:   Malnutrition related to chronic illness as evidenced by percent weight loss, severe depletion of muscle mass.  GOAL:   Patient will meet greater than or equal to 90% of their needs  MONITOR:   PO intake, Supplement acceptance, Weight trends, Labs, Skin, I & O's  REASON FOR ASSESSMENT:   Malnutrition Screening Tool  ASSESSMENT:   81 y.o. female with a PMH of recently seen nephrolithiasis, advanced dementia, small cell as well as adenocarcinoma of the lung diagnosed 03/2007 status post surgery/chemotherapy, diabetes, hypothyroidism and rheumatoid arthritis who presented to the ED with a chief complaint of a 2 day history of nausea, vomiting, and diarrhea. Of note, the patient has a chronic indwelling Foley catheter secondary to urinary retention. According to family, she is essentially bedridden and has 24 hour care given by her husband and a caregiver. She was seen in the ED on 01/06/16 with reports of vomiting and diarrhea, and at the time of that evaluation, the stone seen on imaging was thought to be chronic.  Pt seen for MST. BMI indicates normal weight. Per chart review, pt consumed 25% of lunch and 80% of dinner yesterday and 95% of lunch today. Pt with hx of advanced dementia and unable to provide PTA information.   Per chart review, pt has lost 31 lbs (20% body weight) in the past 6 months which is significant for time frame. Severe muscle and moderate fat wasting noted  during physical assessment.   Pt had cystoscopy with right double-J stent placement on 11/22. Continue to encourage PO intakes and will order nutrition supplements as outlined above.  Medications reviewed; 1 tablet Oscal with D BID, 325 mg oral ferrous sulfate BID, sliding scale Novolog, PRN Zofran, PRN Miralax, 40 mEq oral KCl x1 dose today, 5 mg oral Deltasone/day. Labs reviewed; CBGs: 144 and 174 mg/dL today, K: 3.4 mmol/L, Cl: 112 mmol/L, Ca: 7.7 mg/dL.  IVF: NS @ 50 mL/hr.    Diet Order:  Diet Carb Modified Fluid consistency: Thin; Room service appropriate? Yes  Skin:  Wound (see comment) (Stage 1 bilateral heel pressure injuries, DTI to sacrum, Perineum incision (11/22))  Last BM:  11/24  Height:   Ht Readings from Last 1 Encounters:  01/08/16 '5\' 6"'$  (1.676 m)    Weight:   Wt Readings from Last 1 Encounters:  01/08/16 126 lb 8.7 oz (57.4 kg)    Ideal Body Weight:  59.09 kg  BMI:  Body mass index is 20.42 kg/m.  Estimated Nutritional Needs:   Kcal:  1320-1550 (23*27 kcal/kg)  Protein:  55-65 grams  Fluid:  >/= 1.5 L/day  EDUCATION NEEDS:   No education needs identified at this time    Jarome Matin, MS, RD, LDN, CNSC Inpatient Clinical Dietitian Pager # [phone] After hours/weekend pager # [phone]

## 2016-01-10 NOTE — Progress Notes (Signed)
Patient ID: Marcia [name], female   DOB: 13-Jun-1934, 81 y.o.   MRN: [id number]  PROGRESS NOTE    Marcia [name]  HEN:[id number] DOB: May 24, 1934 DOA: 01/08/2016  PCP: Reginia Naas, MD   Brief Narrative:  12 -year-old female with past medical history significant for advanced dementia, small cell adenocarcinoma of the lung diagnosed in 2009 status post surgery and chemotherapy, diabetes, hypothyroidism, rheumatoid arthritis. She presented to ED with 2 day history of nausea, vomiting and diarrhea. Patient has chronic indwelling Foley catheter secondary to urinary retention. Per family report, patient is essentially bedridden and has 24-hour care at home. Patient was seen in ED 01/06/2016 with vomiting and diarrhea. Evaluation at that time revealed stone on imaging studies which was thought to be chronic. Urine culture was sent and patient was subsequently discharged home after receiving IV fluids. She was supposed to follow-up with urology on outpatient basis however patient's condition worsened at home and she subsequently came back with presentation of unresponsiveness in addition to fevers.  Repeat CT scan of the abdomen showed moderate right hydronephrosis and marked right dilated extrarenal pelvis and hydroureter secondary to 6 mm stone in the distal right ureter. Patient underwent urgent cystoscopy with placement of right double J stent. She was started on cefepime.   Assessment & Plan:   Principal Problem: Sepsis secondary to Proteus mirabilis and Klebsiella pneumonia secondary to acute pyelonephritis / leukocytosis / lactic acidosis / Enterobacteriaceae bacteremia  - Sepsis criteria met on the admission with fever, tachycardia, tachypnea, hypoxia, leukocytosis and lactic acidosis Source of infection is acute pyelonephritis secondary to hydronephrosis - Urine culture on November 20 growing Proteus mirabilis and Klebsiella pneumonia species - Cefepime switched to Unasyn per sens report -  Blood cx on admission growing enterobacteriaceae species  - Repeat blood cx today to ensure clearance of bacteremia  - White blood cell count and lactic acid normalized  Active Problems:   Cancer of right lung (HCC) - Status post surgery and chemotherapy    Vascular dementia, uncomplicated - Continue Aricept and Namenda    Rheumatoid arthritis involving both shoulders with positive rheumatoid factor (HCC) - Continue low-dose prednisone 5 mg daily    Essential hypertension - Continue Norvasc 10 mg daily    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 - Patient on sliding scale insulin - CBG's in past 24 hours: 149, 251, 144    Dyslipidemia associated with type 2 diabetes mellitus - Continue statin therapy and zetia     Chronic kidney disease stage III - Baseline creatinine 6 months ago is 1.05 - Creatinine within baseline range on this admission    Hypokalemia - Likely from sepsis - Supplemented today again     Pressure injury of skin, right lower extremity - Wound care consulted, appreciate their assessment     DVT prophylaxis: Lovenox subQ  Code Status: full code  Family Communication: caregiver at the bedside this am; spoke with patient's husband over the phone this am Disposition Plan: home likely in next 2-3 days once repeat BCx back to ensure clearance of bacteremia    Consultants:   GU  Procedures:   Double J stent placed 01/08/2016   Antimicrobials:   Cefepime 01/08/2016 -->01/09/16  Unasyn 01/09/2016 -->   Subjective: No overnight events.   Objective: Vitals:   01/09/16 1000 01/09/16 1351 01/09/16 2054 01/10/16 0650  BP:  (!) 111/43 (!) 103/52 (!) 120/50  Pulse:  85 92 80  Resp:  '16 16 20  ' Temp: Marland Kitchen)  102 F (38.9 C) 97.5 F (36.4 C) 99.4 F (37.4 C) 98.3 F (36.8 C)  TempSrc: Rectal Axillary Oral Oral  SpO2:  91% 91% 92%  Weight:      Height:        Intake/Output Summary (Last 24 hours) at 01/10/16  1124 Last data filed at 01/10/16 1004  Gross per 24 hour  Intake          3091.67 ml  Output             1325 ml  Net          1766.67 ml   Filed Weights   01/08/16 1625 01/08/16 2329  Weight: 71.2 kg (157 lb) 57.4 kg (126 lb 8.7 oz)    Examination:  General exam: Appears calm and comfortable, no distress  Respiratory system: No wheezing, no rhonchi  Cardiovascular system: S1 & S2 heard, Rate controlled  Gastrointestinal system: (+) BS, non tender  Central nervous system: No focal neurological deficits. Extremities: No edema, wound over RLE Skin: laceration on RLE, left heel pressure injury  Psychiatry: Normal mood and behavior   Data Reviewed: I have personally reviewed following labs and imaging studies  CBC:  Recent Labs Lab 01/06/16 2055 01/08/16 1711 01/09/16 0350 01/10/16 0329  WBC 11.7* 11.7* 8.6 6.1  NEUTROABS 9.9*  --   --   --   HGB 13.5 12.7 11.4* 11.1*  HCT 39.5 38.9 34.9* 33.4*  MCV 87.2 89.6 88.8 87.4  PLT 207 211 206 559   Basic Metabolic Panel:  Recent Labs Lab 01/06/16 2055 01/08/16 1711 01/09/16 0350 01/10/16 0329  NA 141 138 142 143  K 3.3* 3.7 3.1* 3.4*  CL 105 101 110 112*  CO2 '25 25 25 24  ' GLUCOSE 139* 281* 173* 199*  BUN 18 24* 18 12  CREATININE 1.04* 1.71* 1.05* 0.71  CALCIUM 9.2 8.4* 7.9* 7.7*   GFR: Estimated Creatinine Clearance: 51.7 mL/min (by C-G formula based on SCr of 0.71 mg/dL). Liver Function Tests:  Recent Labs Lab 01/06/16 2055 01/08/16 1711  AST 19 27  ALT 12* 13*  ALKPHOS 82 87  BILITOT 1.4* 1.1  PROT 5.9* 6.3*  ALBUMIN 2.8* 2.5*    Recent Labs Lab 01/06/16 2055 01/08/16 1711  LIPASE 21 28   No results for input(s): AMMONIA in the last 168 hours. Coagulation Profile: No results for input(s): INR, PROTIME in the last 168 hours. Cardiac Enzymes: No results for input(s): CKTOTAL, CKMB, CKMBINDEX, TROPONINI in the last 168 hours. BNP (last 3 results) No results for input(s): PROBNP in the last 8760  hours. HbA1C: No results for input(s): HGBA1C in the last 72 hours. CBG:  Recent Labs Lab 01/09/16 0750 01/09/16 1138 01/09/16 1659 01/09/16 2147 01/10/16 0738  GLUCAP 154* 99 149* 251* 144*   Lipid Profile: No results for input(s): CHOL, HDL, LDLCALC, TRIG, CHOLHDL, LDLDIRECT in the last 72 hours. Thyroid Function Tests:  Recent Labs  01/08/16 1711  TSH 1.257   Anemia Panel: No results for input(s): VITAMINB12, FOLATE, FERRITIN, TIBC, IRON, RETICCTPCT in the last 72 hours. Urine analysis:    Component Value Date/Time   COLORURINE YELLOW 01/08/2016 1700   APPEARANCEUR TURBID (A) 01/08/2016 1700   LABSPEC 1.016 01/08/2016 1700   LABSPEC 1.005 09/21/2007 1410   PHURINE 6.5 01/08/2016 1700   GLUCOSEU NEGATIVE 01/08/2016 1700   HGBUR MODERATE (A) 01/08/2016 1700   BILIRUBINUR NEGATIVE 01/08/2016 1700   BILIRUBINUR Negative 09/21/2007 1410   KETONESUR 40 (A) 01/08/2016 1700  PROTEINUR 100 (A) 01/08/2016 1700   UROBILINOGEN 0.2 09/01/2011 1746   NITRITE POSITIVE (A) 01/08/2016 1700   LEUKOCYTESUR LARGE (A) 01/08/2016 1700   LEUKOCYTESUR Moderate 09/21/2007 1410   Sepsis Labs: '@[email or web address]' (procalcitonin:4,lacticidven:4)   Urine culture     Status: Abnormal   Collection Time: 01/06/16  9:07 PM  Result Value Ref Range Status   Specimen Description URINE, RANDOM  Final   Special Requests NONE  Final   Culture (A)  Final   Report Status 01/09/2016 FINAL  Final   Organism ID, Bacteria KLEBSIELLA PNEUMONIAE (A)  Final   Organism ID, Bacteria PROTEUS MIRABILIS (A)  Final      Susceptibility   Klebsiella pneumoniae - MIC*    AMPICILLIN >=32 RESISTANT Resistant     CEFAZOLIN <=4 SENSITIVE Sensitive     CEFTRIAXONE <=1 SENSITIVE Sensitive     CIPROFLOXACIN <=0.25 SENSITIVE Sensitive     GENTAMICIN <=1 SENSITIVE Sensitive     IMIPENEM <=0.25 SENSITIVE Sensitive     NITROFURANTOIN 64 INTERMEDIATE Intermediate     TRIMETH/SULFA <=20 SENSITIVE Sensitive      AMPICILLIN/SULBACTAM 8 SENSITIVE Sensitive     PIP/TAZO <=4 SENSITIVE Sensitive     Extended ESBL NEGATIVE Sensitive     * >=100,000 COLONIES/mL KLEBSIELLA PNEUMONIAE   Proteus mirabilis - MIC*    AMPICILLIN <=2 RESISTANT Resistant     CEFAZOLIN <=4 RESISTANT Resistant     CEFTRIAXONE <=1 RESISTANT Resistant     CIPROFLOXACIN <=0.25 SENSITIVE Sensitive     GENTAMICIN <=1 SENSITIVE Sensitive     IMIPENEM 4 SENSITIVE Sensitive     NITROFURANTOIN 128 RESISTANT Resistant     TRIMETH/SULFA <=20 SENSITIVE Sensitive     AMPICILLIN/SULBACTAM <=2 SENSITIVE Sensitive     PIP/TAZO <=4 SENSITIVE Sensitive     * >=100,000 COLONIES/mL PROTEUS MIRABILIS  Culture, blood (Routine x 2)     Status: None (Preliminary result)   Collection Time: 01/08/16  5:27 PM  Result Value Ref Range Status   Specimen Description BLOOD BLOOD RIGHT FOREARM  Final   Special Requests BOTTLES DRAWN AEROBIC AND ANAEROBIC 5CC  Final   Culture  Setup Time   Final    GRAM NEGATIVE RODS   Report Status PENDING  Incomplete      Radiology Studies: Ct Abdomen Pelvis Wo Contrast Result Date: 01/08/2016 1. Severe right obstructive uropathy with complete obstruction of the right ureter at the ureterovesical junction secondary to presence of 6 mm stone. Severe right hydroureteronephrosis with excreted contrast from the scan of 2 days ago filling the renal pelvis and right ureter. 2. Striated nephrogram appearance of the right kidney is likely secondary to acute kidney injury caused by the above described ureteral obstruction. 3. Small right pleural effusion. Electronically Signed   By: Ulyses Jarred M.D.   On: 01/08/2016 19:00   Dg Chest 2 View Result Date: 01/08/2016  1. Postsurgical changes of the right upper lung. Slight increased density in the region of the surgical sutures/presumed scarring on AP view, cannot exclude acute infiltrate or other airspace abnormality in the region. 2. Mild cardiomegaly with mild central vascular  congestion. Electronically Signed   By: Donavan Foil M.D.   On: 01/08/2016 18:21   Ct Abdomen Pelvis W Contrast Result Date: 01/06/2016 1. Moderate right hydronephrosis and marked right dilated extra renal pelvis and hydroureter secondary to a 6 mm stone in the distal right ureter, just above the right UVJ. No left hydronephrosis. 2. Small right posterior diaphragmatic hernia which contains fat.  3. Nonspecific mild hazy attenuation within the central mesenteries, possibly related to mild mesenteric edema. 4. Mild rectal asymmetric thickening, could be due to proctitis or possible rectal mass, recommend correlation with physical examination. Electronically Signed   By: Donavan Foil M.D.   On: 01/06/2016 22:29      Scheduled Meds: . amLODipine  10 mg Oral Daily  . ampicillin-sulbactam (UNASYN) IV  3 g Intravenous Q6H  . aspirin EC  81 mg Oral Daily  . atorvastatin  40 mg Oral Daily  . calcium-vitamin D  1 tablet Oral BID  . donepezil  23 mg Oral QHS  . enoxaparin (LOVENOX) injection  30 mg Subcutaneous Daily  . ezetimibe  10 mg Oral Daily  . ferrous sulfate  325 mg Oral BID WC  . insulin aspart  0-15 Units Subcutaneous TID WC  . insulin aspart  0-5 Units Subcutaneous QHS  . memantine  10 mg Oral BID  . predniSONE  5 mg Oral Daily   Continuous Infusions: . sodium chloride 50 mL/hr at 01/10/16 0109     LOS: 2 days    Time spent: 25 minutes  Greater than 50% of the time spent on counseling and coordinating the care.   Leisa Lenz, MD [facility] Pager [phone]  If 7PM-7AM, please contact night-coverage [email or web address] Password [facility] 01/10/2016, 11:24 AM

## 2016-01-11 DIAGNOSIS — F0280 Dementia in other diseases classified elsewhere without behavioral disturbance: Secondary | ICD-10-CM

## 2016-01-11 DIAGNOSIS — E43 Unspecified severe protein-calorie malnutrition: Secondary | ICD-10-CM | POA: Insufficient documentation

## 2016-01-11 DIAGNOSIS — N1330 Unspecified hydronephrosis: Secondary | ICD-10-CM

## 2016-01-11 LAB — BASIC METABOLIC PANEL
Anion gap: 9 (ref 5–15)
BUN: 9 mg/dL (ref 6–20)
CHLORIDE: 108 mmol/L (ref 101–111)
CO2: 28 mmol/L (ref 22–32)
Calcium: 7.6 mg/dL — ABNORMAL LOW (ref 8.9–10.3)
Creatinine, Ser: 0.66 mg/dL (ref 0.44–1.00)
GFR calc Af Amer: >60 mL/min (ref >60)
GFR calc non Af Amer: >60 mL/min (ref >60)
GLUCOSE: 198 mg/dL — AB (ref 65–99)
POTASSIUM: 2.9 mmol/L — AB (ref 3.5–5.1)
Sodium: 145 mmol/L (ref 135–145)

## 2016-01-11 LAB — URINE CULTURE
Culture: >=100000 — AB
Culture: 20000 — AB

## 2016-01-11 LAB — CULTURE, BLOOD (ROUTINE X 2)

## 2016-01-11 LAB — GLUCOSE, CAPILLARY
GLUCOSE-CAPILLARY: 190 mg/dL — AB (ref 65–99)
GLUCOSE-CAPILLARY: 244 mg/dL — AB (ref 65–99)
GLUCOSE-CAPILLARY: 304 mg/dL — AB (ref 65–99)
Glucose-Capillary: 175 mg/dL — ABNORMAL HIGH (ref 65–99)

## 2016-01-11 LAB — CBC
HEMATOCRIT: 33 % — AB (ref 36.0–46.0)
Hemoglobin: 11 g/dL — ABNORMAL LOW (ref 12.0–15.0)
MCH: 28.8 pg (ref 26.0–34.0)
MCHC: 33.3 g/dL (ref 30.0–36.0)
MCV: 86.4 fL (ref 78.0–100.0)
PLATELETS: 205 10*3/uL (ref 150–400)
RBC: 3.82 MIL/uL — AB (ref 3.87–5.11)
RDW: 14.4 % (ref 11.5–15.5)
WBC: 5.1 10*3/uL (ref 4.0–10.5)

## 2016-01-11 LAB — MAGNESIUM: Magnesium: 1.1 mg/dL — ABNORMAL LOW (ref 1.7–2.4)

## 2016-01-11 MED ORDER — SODIUM CHLORIDE 0.9 % IV SOLN
500.0000 mg | Freq: Three times a day (TID) | INTRAVENOUS | Status: DC
  Administered 2016-01-11 – 2016-01-13 (×6): 500 mg via INTRAVENOUS
  Filled 2016-01-11 (×8): qty 500

## 2016-01-11 MED ORDER — MAGNESIUM SULFATE 2 GM/50ML IV SOLN
2.0000 g | Freq: Once | INTRAVENOUS | Status: AC
  Administered 2016-01-11: 2 g via INTRAVENOUS
  Filled 2016-01-11: qty 50

## 2016-01-11 MED ORDER — POTASSIUM CHLORIDE CRYS ER 20 MEQ PO TBCR
40.0000 meq | EXTENDED_RELEASE_TABLET | Freq: Once | ORAL | Status: AC
  Administered 2016-01-11: 40 meq via ORAL
  Filled 2016-01-11: qty 2

## 2016-01-11 NOTE — Progress Notes (Addendum)
Patient ID: Marcia [name], female   DOB: 1934/12/10, 81 y.o.   MRN: [id number]  PROGRESS NOTE    Marcia [name]  TKW:[id number] DOB: 09/05/1934 DOA: 01/08/2016  PCP: Reginia Naas, MD   Brief Narrative:  46 -year-old female with past medical history significant for advanced dementia, small cell adenocarcinoma of the lung diagnosed in 2009 status post surgery and chemotherapy, diabetes, hypothyroidism, rheumatoid arthritis. She presented to ED with 2 day history of nausea, vomiting and diarrhea. Patient has chronic indwelling Foley catheter secondary to urinary retention. Per family report, patient is essentially bedridden and has 24-hour care at home. Patient was seen in ED 01/06/2016 with vomiting and diarrhea. Evaluation at that time revealed stone on imaging studies which was thought to be chronic. Urine culture was sent and patient was subsequently discharged home after receiving IV fluids. She was supposed to follow-up with urology on outpatient basis however patient's condition worsened at home and she subsequently came back with presentation of unresponsiveness in addition to fevers.  Repeat CT scan of the abdomen showed moderate right hydronephrosis and marked right dilated extrarenal pelvis and hydroureter secondary to 6 mm stone in the distal right ureter. Patient underwent urgent cystoscopy with placement of right double J stent. She was started on cefepime.   Assessment & Plan:   Principal Problem: Sepsis secondary to Proteus mirabilis and Klebsiella pneumonia secondary to acute pyelonephritis / leukocytosis / lactic acidosis / Enterobacteriaceae bacteremia  - Sepsis criteria met on the admission with fever, tachycardia, tachypnea, hypoxia, leukocytosis and lactic acidosis Source of infection is acute pyelonephritis secondary to hydronephrosis - Urine culture on November 20 growing Proteus mirabilis and Klebsiella pneumonia species  - Cefepime switched to Unasyn per sens report -  Blood cx on admission growing enterobacteriaceae species  - Repeat blood cx 11/24 pending  - White blood cell count and lactic acid normalized  Active Problems:   Hydronephrosis of the right kidney  - Patient is status post right ureteral stent placement for stone with hydronephrosis, post op day #3 - Per urology, patient will need follow-up after discharge for planning of ureteroscopic stone extraction    Cancer of right lung ([place]) - Status post surgery and chemotherapy    Vascular dementia, uncomplicated - Continue Aricept and Namenda    Rheumatoid arthritis involving both shoulders with positive rheumatoid factor ([place]) - Continue low-dose prednisone 5 mg daily    Essential hypertension - Continue Norvasc 10 mg daily    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 - Patient on sliding scale insulin    Dyslipidemia associated with type 2 diabetes mellitus - Continue statin therapy and zetia     Chronic kidney disease stage III - Baseline creatinine 6 months ago is 1.05 - Creatinine within baseline range on this admission    Hypokalemia - Likely from sepsis - Supplemented today again     Pressure injury of skin, right lower extremity - Wound care consulted, appreciate their assessment     Severe protein calorie malnutrition - In the context of chronic illness - Seen by nutritionist   DVT prophylaxis: Lovenox subQ  Code Status: full code  Family Communication: caregiver at the bedside this am; spoke with patient's husband over the phone this am Disposition Plan: home likely by Monday if repeat BCx results back    Consultants:   GU  Procedures:   Double J stent placed 01/08/2016   Antimicrobials:   Cefepime 01/08/2016 -->01/09/16  Unasyn 01/09/2016 -->   Subjective:  No overnight events.   Objective: Vitals:   01/10/16 1500 01/10/16 2250 01/11/16 0237 01/11/16 0559  BP: (!) 122/59 (!) 128/56 (!) 151/66 (!) 125/58  Pulse: 93  80 82 77  Resp: '18 20 20 20  ' Temp: 97.9 F (36.6 C) 98.4 F (36.9 C) 98.7 F (37.1 C) 98.2 F (36.8 C)  TempSrc: Oral Axillary Oral Axillary  SpO2: 92% 95% 96% 97%  Weight:      Height:        Intake/Output Summary (Last 24 hours) at 01/11/16 1101 Last data filed at 01/11/16 0600  Gross per 24 hour  Intake             1660 ml  Output             1751 ml  Net              -91 ml   Filed Weights   01/08/16 1625 01/08/16 2329  Weight: 71.2 kg (157 lb) 57.4 kg (126 lb 8.7 oz)    Examination:  General exam: no distress  Respiratory system: No wheezing, no rhonchi  Cardiovascular system: S1 & S2 heard, RRR Gastrointestinal system: (+) BS, non tender and not distended  Central nervous system: Nonfocal  Extremities: No edema, wound over RLE Skin: laceration on RLE, left heel pressure injury  Psychiatry: Not agitated or restless   Data Reviewed: I have personally reviewed following labs and imaging studies  CBC:  Recent Labs Lab 01/06/16 2055 01/08/16 1711 01/09/16 0350 01/10/16 0329 01/11/16 0609  WBC 11.7* 11.7* 8.6 6.1 5.1  NEUTROABS 9.9*  --   --   --   --   HGB 13.5 12.7 11.4* 11.1* 11.0*  HCT 39.5 38.9 34.9* 33.4* 33.0*  MCV 87.2 89.6 88.8 87.4 86.4  PLT 207 211 206 189 789   Basic Metabolic Panel:  Recent Labs Lab 01/06/16 2055 01/08/16 1711 01/09/16 0350 01/10/16 0329 01/11/16 0609  NA 141 138 142 143 145  K 3.3* 3.7 3.1* 3.4* 2.9*  CL 105 101 110 112* 108  CO2 '25 25 25 24 28  ' GLUCOSE 139* 281* 173* 199* 198*  BUN 18 24* '18 12 9  ' CREATININE 1.04* 1.71* 1.05* 0.71 0.66  CALCIUM 9.2 8.4* 7.9* 7.7* 7.6*  MG  --   --   --   --  1.1*   GFR: Estimated Creatinine Clearance: 51.7 mL/min (by C-G formula based on SCr of 0.66 mg/dL). Liver Function Tests:  Recent Labs Lab 01/06/16 2055 01/08/16 1711  AST 19 27  ALT 12* 13*  ALKPHOS 82 87  BILITOT 1.4* 1.1  PROT 5.9* 6.3*  ALBUMIN 2.8* 2.5*    Recent Labs Lab 01/06/16 2055 01/08/16 1711    LIPASE 21 28   No results for input(s): AMMONIA in the last 168 hours. Coagulation Profile: No results for input(s): INR, PROTIME in the last 168 hours. Cardiac Enzymes: No results for input(s): CKTOTAL, CKMB, CKMBINDEX, TROPONINI in the last 168 hours. BNP (last 3 results) No results for input(s): PROBNP in the last 8760 hours. HbA1C: No results for input(s): HGBA1C in the last 72 hours. CBG:  Recent Labs Lab 01/10/16 0738 01/10/16 1203 01/10/16 1711 01/10/16 2243 01/11/16 0748  GLUCAP 144* 174* 242* 327* 175*   Lipid Profile: No results for input(s): CHOL, HDL, LDLCALC, TRIG, CHOLHDL, LDLDIRECT in the last 72 hours. Thyroid Function Tests:  Recent Labs  01/08/16 1711  TSH 1.257   Anemia Panel: No results for input(s): VITAMINB12, FOLATE, FERRITIN,  TIBC, IRON, RETICCTPCT in the last 72 hours. Urine analysis:    Component Value Date/Time   COLORURINE YELLOW 01/08/2016 1700   APPEARANCEUR TURBID (A) 01/08/2016 1700   LABSPEC 1.016 01/08/2016 1700   LABSPEC 1.005 09/21/2007 1410   PHURINE 6.5 01/08/2016 1700   GLUCOSEU NEGATIVE 01/08/2016 1700   HGBUR MODERATE (A) 01/08/2016 1700   BILIRUBINUR NEGATIVE 01/08/2016 1700   BILIRUBINUR Negative 09/21/2007 1410   KETONESUR 40 (A) 01/08/2016 1700   PROTEINUR 100 (A) 01/08/2016 1700   UROBILINOGEN 0.2 09/01/2011 1746   NITRITE POSITIVE (A) 01/08/2016 1700   LEUKOCYTESUR LARGE (A) 01/08/2016 1700   LEUKOCYTESUR Moderate 09/21/2007 1410   Sepsis Labs: '@[email or web address]' (procalcitonin:4,lacticidven:4)   Urine culture     Status: Abnormal   Collection Time: 01/06/16  9:07 PM  Result Value Ref Range Status   Specimen Description URINE, RANDOM  Final   Special Requests NONE  Final   Culture (A)  Final   Report Status 01/09/2016 FINAL  Final   Organism ID, Bacteria KLEBSIELLA PNEUMONIAE (A)  Final   Organism ID, Bacteria PROTEUS MIRABILIS (A)  Final      Susceptibility   Klebsiella pneumoniae - MIC*    AMPICILLIN >=32  RESISTANT Resistant     CEFAZOLIN <=4 SENSITIVE Sensitive     CEFTRIAXONE <=1 SENSITIVE Sensitive     CIPROFLOXACIN <=0.25 SENSITIVE Sensitive     GENTAMICIN <=1 SENSITIVE Sensitive     IMIPENEM <=0.25 SENSITIVE Sensitive     NITROFURANTOIN 64 INTERMEDIATE Intermediate     TRIMETH/SULFA <=20 SENSITIVE Sensitive     AMPICILLIN/SULBACTAM 8 SENSITIVE Sensitive     PIP/TAZO <=4 SENSITIVE Sensitive     Extended ESBL NEGATIVE Sensitive     * >=100,000 COLONIES/mL KLEBSIELLA PNEUMONIAE   Proteus mirabilis - MIC*    AMPICILLIN <=2 RESISTANT Resistant     CEFAZOLIN <=4 RESISTANT Resistant     CEFTRIAXONE <=1 RESISTANT Resistant     CIPROFLOXACIN <=0.25 SENSITIVE Sensitive     GENTAMICIN <=1 SENSITIVE Sensitive     IMIPENEM 4 SENSITIVE Sensitive     NITROFURANTOIN 128 RESISTANT Resistant     TRIMETH/SULFA <=20 SENSITIVE Sensitive     AMPICILLIN/SULBACTAM <=2 SENSITIVE Sensitive     PIP/TAZO <=4 SENSITIVE Sensitive     * >=100,000 COLONIES/mL PROTEUS MIRABILIS  Culture, blood (Routine x 2)     Status: None (Preliminary result)   Collection Time: 01/08/16  5:27 PM  Result Value Ref Range Status   Specimen Description BLOOD BLOOD RIGHT FOREARM  Final   Special Requests BOTTLES DRAWN AEROBIC AND ANAEROBIC 5CC  Final   Culture  Setup Time   Final    GRAM NEGATIVE RODS   Report Status PENDING  Incomplete      Radiology Studies: Ct Abdomen Pelvis Wo Contrast Result Date: 01/08/2016 1. Severe right obstructive uropathy with complete obstruction of the right ureter at the ureterovesical junction secondary to presence of 6 mm stone. Severe right hydroureteronephrosis with excreted contrast from the scan of 2 days ago filling the renal pelvis and right ureter. 2. Striated nephrogram appearance of the right kidney is likely secondary to acute kidney injury caused by the above described ureteral obstruction. 3. Small right pleural effusion. Electronically Signed   By: Ulyses Jarred M.D.   On:  01/08/2016 19:00   Dg Chest 2 View Result Date: 01/08/2016  1. Postsurgical changes of the right upper lung. Slight increased density in the region of the surgical sutures/presumed scarring on AP view, cannot exclude acute  infiltrate or other airspace abnormality in the region. 2. Mild cardiomegaly with mild central vascular congestion. Electronically Signed   By: Donavan Foil M.D.   On: 01/08/2016 18:21   Ct Abdomen Pelvis W Contrast Result Date: 01/06/2016 1. Moderate right hydronephrosis and marked right dilated extra renal pelvis and hydroureter secondary to a 6 mm stone in the distal right ureter, just above the right UVJ. No left hydronephrosis. 2. Small right posterior diaphragmatic hernia which contains fat. 3. Nonspecific mild hazy attenuation within the central mesenteries, possibly related to mild mesenteric edema. 4. Mild rectal asymmetric thickening, could be due to proctitis or possible rectal mass, recommend correlation with physical examination. Electronically Signed   By: Donavan Foil M.D.   On: 01/06/2016 22:29      Scheduled Meds: . amLODipine  10 mg Oral Daily  . ampicillin-sulbactam (UNASYN) IV  3 g Intravenous Q6H  . aspirin EC  81 mg Oral Daily  . atorvastatin  40 mg Oral Daily  . calcium-vitamin D  1 tablet Oral BID  . donepezil  23 mg Oral QHS  . enoxaparin (LOVENOX) injection  40 mg Subcutaneous Daily  . ezetimibe  10 mg Oral Daily  . feeding supplement (ENSURE ENLIVE)  237 mL Oral BID BM  . ferrous sulfate  325 mg Oral BID WC  . insulin aspart  0-15 Units Subcutaneous TID WC  . insulin aspart  0-5 Units Subcutaneous QHS  . magnesium sulfate 1 - 4 g bolus IVPB  2 g Intravenous Once  . memantine  10 mg Oral BID  . multivitamin with minerals  1 tablet Oral Daily  . potassium chloride  40 mEq Oral Once  . predniSONE  5 mg Oral Daily   Continuous Infusions: . sodium chloride 50 mL/hr at 01/10/16 2231     LOS: 3 days    Time spent: 15 minutes  Greater  than 50% of the time spent on counseling and coordinating the care.   Leisa Lenz, MD [facility] Pager [phone]  If 7PM-7AM, please contact night-coverage [email or web address] Password [email or web address] 01/11/2016, 11:01 AM

## 2016-01-11 NOTE — Progress Notes (Signed)
Pharmacy Antibiotic Note  Marcia [name] is a 81 y.o. female admitted on 01/08/2016 with sepsis 2/2 pyelonephritis. CT scan showed right distal ureteral stone with hydroureteronephrosis. Patient underwent urgent cystoscopy and R ureteral stone placement. Patient initially placed on Cefepime. Blood cultures returned + for Proteus species on 11/23, so antibiotics changed to Unasyn (per available sensitivities for Proteus mirabilis and Klebsiella pneumoniae from urine culture on 11/20 when patient had come to ED).   Today, urine culture from 11/22 finalized as Proteus species with susceptibility pattern suggesting possibility of ESBL and Klebsiella pneumoniae. Blood cultures also showing Proteus mirabilis with possibility of ESBL. Therefore, pharmacy has been consulted to change antibiotics to Primaxin. Repeat blood cultures are pending.  Plan: Primaxin '500mg'$  IV q8h. Monitor renal function, cultures, clinical course.  Height: '5\' 6"'$  (167.6 cm) Weight: 126 lb 8.7 oz (57.4 kg) IBW/kg (Calculated) : 59.3  Temp (24hrs), Avg:98.3 F (36.8 C), Min:97.9 F (36.6 C), Max:98.7 F (37.1 C)   Recent Labs Lab 01/06/16 2055 01/08/16 1711 01/08/16 1719 01/08/16 2052 01/08/16 2341 01/09/16 0350 01/10/16 0329 01/11/16 0609  WBC 11.7* 11.7*  --   --   --  8.6 6.1 5.1  CREATININE 1.04* 1.71*  --   --   --  1.05* 0.71 0.66  LATICACIDVEN  --   --  2.08* 3.01* 1.6  --   --   --     Estimated Creatinine Clearance: 51.7 mL/min (by C-G formula based on SCr of 0.66 mg/dL).    Allergies  Allergen Reactions  . Amoxicillin Nausea And Vomiting  . Cefaclor Nausea And Vomiting  . Hydrocodone Nausea And Vomiting  . Sulfa Antibiotics     " legs give out"    Antimicrobials this admission: 11/22 Cefepime >> 11/23 11/23 Unasyn >> 11/25 11/25 Primaxin >>  Dose adjustments this admission: --  Microbiology results: 11/20 UCx: => 100K Klebsiella pneumoniae (S to all abx tested except R to ampicillin, I to  NTF), => 100K Proteus mirabilis (S to unasyn, cipro, gent, imipenem, zosyn, trimeth/sulfa)  This admission:  11/22BCx: 2/2 with Proteus mirabilis-susceptibility pattern suggests possibility of ESBL 11/22 UCx: => 100K Klebsiella pneumoniae (S to all abx tested except R to ampicillin, I to NTF), => 100K Proteus mirabilis-susceptibility pattern suggests possibility of ESBL 11/22 UCx from cystoscope: 20K Proteus mirabilis (see above regarding susceptibility) 11/24 BCx (repeat): sent 11/24 MRSA PCR: negative   Thank you for allowing pharmacy to be a part of this patient's care.   Lindell Spar, PharmD, BCPS Pager: [phone] 01/11/2016 11:25 PM

## 2016-01-11 NOTE — Progress Notes (Signed)
3 Days Post-Op Subjective: Patient reports that she is feeling fine.  She looks remarkably better than 72 hours ago.  Objective: Vital signs in last 24 hours: Temp:  [97.9 F (36.6 C)-98.7 F (37.1 C)] 98.2 F (36.8 C) (11/25 0559) Pulse Rate:  [77-93] 77 (11/25 0559) Resp:  [18-20] 20 (11/25 0559) BP: (122-151)/(56-66) 125/58 (11/25 0559) SpO2:  [92 %-97 %] 97 % (11/25 0559)  Intake/Output from previous day: 11/24 0701 - 11/25 0700 In: 2080 [P.O.:480; I.V.:1200; IV Piggyback:400] Out: 2251 [Urine:2250; Stool:1] Intake/Output this shift: No intake/output data recorded.  Physical Exam:  Constitutional: Vital signs reviewed. WD WN in NAD   Eyes: PERRL, No scleral icterus.   Pulmonary/Chest: Normal effort Extremities: No cyanosis or edema   Lab Results:  Recent Labs  01/09/16 0350 01/10/16 0329 01/11/16 0609  HGB 11.4* 11.1* 11.0*  HCT 34.9* 33.4* 33.0*   BMET  Recent Labs  01/10/16 0329 01/11/16 0609  NA 143 145  K 3.4* 2.9*  CL 112* 108  CO2 24 28  GLUCOSE 199* 198*  BUN 12 9  CREATININE 0.71 0.66  CALCIUM 7.7* 7.6*   No results for input(s): LABPT, INR in the last 72 hours. No results for input(s): LABURIN in the last 72 hours. Results for orders placed or performed during the hospital encounter of 01/08/16  Urine culture     Status: Abnormal   Collection Time: 01/08/16  5:00 PM  Result Value Ref Range Status   Specimen Description URINE, CLEAN CATCH  Final   Special Requests NONE  Final   Culture (A)  Final    >=100,000 COLONIES/mL KLEBSIELLA PNEUMONIAE >=100,000 COLONIES/mL PROTEUS MIRABILIS Susceptibility Pattern Suggests Possibility of an Extended Spectrum Beta Lactamase Producer. Contact Laboratory Within 7 Days if Confirmation Warranted. Performed at [facility]    Report Status 01/11/2016 FINAL  Final   Organism ID, Bacteria KLEBSIELLA PNEUMONIAE (A)  Final   Organism ID, Bacteria PROTEUS MIRABILIS (A)  Final      Susceptibility   Klebsiella pneumoniae - MIC*    AMPICILLIN >=32 RESISTANT Resistant     CEFAZOLIN <=4 SENSITIVE Sensitive     CEFTRIAXONE <=1 SENSITIVE Sensitive     CIPROFLOXACIN <=0.25 SENSITIVE Sensitive     GENTAMICIN <=1 SENSITIVE Sensitive     IMIPENEM <=0.25 SENSITIVE Sensitive     NITROFURANTOIN 64 INTERMEDIATE Intermediate     TRIMETH/SULFA <=20 SENSITIVE Sensitive     AMPICILLIN/SULBACTAM 8 SENSITIVE Sensitive     PIP/TAZO <=4 SENSITIVE Sensitive     Extended ESBL NEGATIVE Sensitive     * >=100,000 COLONIES/mL KLEBSIELLA PNEUMONIAE   Proteus mirabilis - MIC*    AMPICILLIN 4 RESISTANT Resistant     CEFAZOLIN <=4 RESISTANT Resistant     CEFTRIAXONE <=1 RESISTANT Resistant     CIPROFLOXACIN <=0.25 SENSITIVE Sensitive     GENTAMICIN <=1 SENSITIVE Sensitive     IMIPENEM 4 SENSITIVE Sensitive     NITROFURANTOIN 256 RESISTANT Resistant     TRIMETH/SULFA <=20 SENSITIVE Sensitive     AMPICILLIN/SULBACTAM <=2 SENSITIVE Sensitive     PIP/TAZO <=4 SENSITIVE Sensitive     * >=100,000 COLONIES/mL PROTEUS MIRABILIS  Culture, blood (Routine x 2)     Status: None (Preliminary result)   Collection Time: 01/08/16  5:22 PM  Result Value Ref Range Status   Specimen Description BLOOD RIGHT ANTECUBITAL  Final   Special Requests BOTTLES DRAWN AEROBIC AND ANAEROBIC 5CC  Final   Culture  Setup Time   Final  GRAM NEGATIVE RODS AEROBIC BOTTLE ONLY CRITICAL VALUE NOTED.  VALUE IS CONSISTENT WITH PREVIOUSLY REPORTED AND CALLED VALUE. Performed at [facility]  Final   Report Status PENDING  Incomplete  Culture, blood (Routine x 2)     Status: Abnormal   Collection Time: 01/08/16  5:27 PM  Result Value Ref Range Status   Specimen Description BLOOD BLOOD RIGHT FOREARM  Final   Special Requests BOTTLES DRAWN AEROBIC AND ANAEROBIC 5CC  Final   Culture  Setup Time   Final    GRAM NEGATIVE RODS IN BOTH AEROBIC AND ANAEROBIC BOTTLES CRITICAL RESULT CALLED TO, READ BACK BY  AND VERIFIED WITH: C. SHADE, [place] ON [id number] BY Rhea Bleacher    Culture (A)  Final    PROTEUS MIRABILIS Susceptibility Pattern Suggests Possibility of an Extended Spectrum Beta Lactamase Producer. Contact Laboratory Within 7 Days if Confirmation Warranted. Performed at [facility]    Report Status 01/11/2016 FINAL  Final   Organism ID, Bacteria PROTEUS MIRABILIS  Final      Susceptibility   Proteus mirabilis - MIC*    AMPICILLIN <=2 RESISTANT Resistant     CEFAZOLIN <=4 RESISTANT Resistant     CEFEPIME <=1 RESISTANT Resistant     CEFTAZIDIME 8 RESISTANT Resistant     CEFTRIAXONE <=1 RESISTANT Resistant     CIPROFLOXACIN <=0.25 SENSITIVE Sensitive     GENTAMICIN <=1 SENSITIVE Sensitive     IMIPENEM 1 SENSITIVE Sensitive     TRIMETH/SULFA <=20 SENSITIVE Sensitive     AMPICILLIN/SULBACTAM 4 SENSITIVE Sensitive     PIP/TAZO <=4 SENSITIVE Sensitive     * PROTEUS MIRABILIS  Blood Culture ID Panel (Reflexed)     Status: Abnormal   Collection Time: 01/08/16  5:27 PM  Result Value Ref Range Status   Enterococcus species NOT DETECTED NOT DETECTED Final   Listeria monocytogenes NOT DETECTED NOT DETECTED Final   Staphylococcus species NOT DETECTED NOT DETECTED Final   Staphylococcus aureus NOT DETECTED NOT DETECTED Final   Streptococcus species NOT DETECTED NOT DETECTED Final   Streptococcus agalactiae NOT DETECTED NOT DETECTED Final   Streptococcus pneumoniae NOT DETECTED NOT DETECTED Final   Streptococcus pyogenes NOT DETECTED NOT DETECTED Final   Acinetobacter baumannii NOT DETECTED NOT DETECTED Final   Enterobacteriaceae species DETECTED (A) NOT DETECTED Final    Comment: CRITICAL RESULT CALLED TO, READ BACK BY AND VERIFIED WITH: C. SHADE, PHARM AT [id number] ON [id number] BY S. YARBROUGH    Enterobacter cloacae complex NOT DETECTED NOT DETECTED Final   Escherichia coli NOT DETECTED NOT DETECTED Final   Klebsiella oxytoca NOT DETECTED NOT DETECTED Final   Klebsiella pneumoniae  NOT DETECTED NOT DETECTED Final   Proteus species DETECTED (A) NOT DETECTED Final    Comment: CRITICAL RESULT CALLED TO, READ BACK BY AND VERIFIED WITH: C. SHADE, PHARM AT 1102 ON [id number] BY S. YARBROUGH    Serratia marcescens NOT DETECTED NOT DETECTED Final   Carbapenem resistance NOT DETECTED NOT DETECTED Final   Haemophilus influenzae NOT DETECTED NOT DETECTED Final   Neisseria meningitidis NOT DETECTED NOT DETECTED Final   Pseudomonas aeruginosa NOT DETECTED NOT DETECTED Final   Candida albicans NOT DETECTED NOT DETECTED Final   Candida glabrata NOT DETECTED NOT DETECTED Final   Candida krusei NOT DETECTED NOT DETECTED Final   Candida parapsilosis NOT DETECTED NOT DETECTED Final   Candida tropicalis NOT DETECTED NOT DETECTED Final    Comment: Performed at [facility]  [facility]  Urine culture     Status: Abnormal   Collection Time: 01/08/16  9:41 PM  Result Value Ref Range Status   Specimen Description URINE, CLEAN CATCH  Final   Special Requests NONE  Final   Culture (A)  Final    20,000 COLONIES/mL PROTEUS MIRABILIS SUSCEPTIBILITIES PERFORMED ON PREVIOUS CULTURE WITHIN THE LAST 5 DAYS. Performed at [facility]    Report Status 01/11/2016 FINAL  Final  MRSA PCR Screening     Status: None   Collection Time: 01/10/16 12:21 PM  Result Value Ref Range Status   MRSA by PCR NEGATIVE NEGATIVE Final    Comment:        The GeneXpert MRSA Assay (FDA approved for NASAL specimens only), is one component of a comprehensive MRSA colonization surveillance program. It is not intended to diagnose MRSA infection nor to guide or monitor treatment for MRSA infections.     Studies/Results: No results found.  Assessment/Plan:   Postoperative day #3.  Right ureteral stent placement for stone with hydronephrosis.  Urine and blood cultures both positive for Proteus.  She appears to be doing well.    We will need follow-up after discharge for planning of ureteroscopic stone  extraction.  For now, plan per hospitalist service.   LOS: 3 days   Franchot Gallo M 01/11/2016, 9:17 AM

## 2016-01-12 DIAGNOSIS — D72829 Elevated white blood cell count, unspecified: Secondary | ICD-10-CM

## 2016-01-12 DIAGNOSIS — B961 Klebsiella pneumoniae [K. pneumoniae] as the cause of diseases classified elsewhere: Secondary | ICD-10-CM

## 2016-01-12 DIAGNOSIS — B964 Proteus (mirabilis) (morganii) as the cause of diseases classified elsewhere: Secondary | ICD-10-CM

## 2016-01-12 LAB — MAGNESIUM: Magnesium: 1.5 mg/dL — ABNORMAL LOW (ref 1.7–2.4)

## 2016-01-12 LAB — BASIC METABOLIC PANEL
ANION GAP: 6 (ref 5–15)
BUN: 8 mg/dL (ref 6–20)
CHLORIDE: 106 mmol/L (ref 101–111)
CO2: 31 mmol/L (ref 22–32)
Calcium: 7.7 mg/dL — ABNORMAL LOW (ref 8.9–10.3)
Creatinine, Ser: 0.6 mg/dL (ref 0.44–1.00)
GFR calc non Af Amer: >60 mL/min (ref >60)
GLUCOSE: 246 mg/dL — AB (ref 65–99)
POTASSIUM: 2.9 mmol/L — AB (ref 3.5–5.1)
Sodium: 143 mmol/L (ref 135–145)

## 2016-01-12 LAB — GLUCOSE, CAPILLARY
GLUCOSE-CAPILLARY: 203 mg/dL — AB (ref 65–99)
Glucose-Capillary: 270 mg/dL — ABNORMAL HIGH (ref 65–99)
Glucose-Capillary: 281 mg/dL — ABNORMAL HIGH (ref 65–99)
Glucose-Capillary: 270 mg/dL — ABNORMAL HIGH (ref 65–99)

## 2016-01-12 MED ORDER — MAGNESIUM SULFATE 2 GM/50ML IV SOLN
2.0000 g | Freq: Once | INTRAVENOUS | Status: AC
  Administered 2016-01-12: 2 g via INTRAVENOUS
  Filled 2016-01-12: qty 50

## 2016-01-12 MED ORDER — POTASSIUM CHLORIDE CRYS ER 20 MEQ PO TBCR
40.0000 meq | EXTENDED_RELEASE_TABLET | Freq: Two times a day (BID) | ORAL | Status: AC
  Administered 2016-01-12 (×2): 40 meq via ORAL
  Filled 2016-01-12 (×2): qty 2

## 2016-01-12 NOTE — Progress Notes (Signed)
4 Days Post-Op Subjective: Patient alert, responsive.  Eating breakfast.  No complaints.  Objective: Vital signs in last 24 hours: Temp:  [98 F (36.7 C)-98.5 F (36.9 C)] 98 F (36.7 C) (11/26 0516) Pulse Rate:  [76-87] 76 (11/26 0516) Resp:  [20-28] 28 (11/26 0516) BP: (120-140)/(59-64) 138/64 (11/26 0516) SpO2:  [93 %-97 %] 93 % (11/26 0516)  Intake/Output from previous day: 11/25 0701 - 11/26 0700 In: 1900 [P.O.:350; I.V.:1200; IV Piggyback:350] Out: 1600 [Urine:1600] Intake/Output this shift: No intake/output data recorded.  Physical Exam:  Constitutional: Vital signs reviewed. WD WN in NAD   Eyes: PERRL, No scleral icterus.   Pulmonary/Chest: Normal effort Abdominal: Soft. Non-tender, non-distended, bowel sounds are normal, no masses, organomegaly, or guarding present.  Extremities: No cyanosis or edema   Lab Results:  Recent Labs  01/10/16 0329 01/11/16 0609  HGB 11.1* 11.0*  HCT 33.4* 33.0*   BMET  Recent Labs  01/11/16 0609 01/12/16 0526  NA 145 143  K 2.9* 2.9*  CL 108 106  CO2 28 31  GLUCOSE 198* 246*  BUN 9 8  CREATININE 0.66 0.60  CALCIUM 7.6* 7.7*   No results for input(s): LABPT, INR in the last 72 hours. No results for input(s): LABURIN in the last 72 hours. Results for orders placed or performed during the hospital encounter of 01/08/16  Urine culture     Status: Abnormal   Collection Time: 01/08/16  5:00 PM  Result Value Ref Range Status   Specimen Description URINE, CLEAN CATCH  Final   Special Requests NONE  Final   Culture (A)  Final    >=100,000 COLONIES/mL KLEBSIELLA PNEUMONIAE >=100,000 COLONIES/mL PROTEUS MIRABILIS Susceptibility Pattern Suggests Possibility of an Extended Spectrum Beta Lactamase Producer. Contact Laboratory Within 7 Days if Confirmation Warranted. Performed at [facility]    Report Status 01/11/2016 FINAL  Final   Organism ID, Bacteria KLEBSIELLA PNEUMONIAE (A)  Final   Organism ID, Bacteria  PROTEUS MIRABILIS (A)  Final      Susceptibility   Klebsiella pneumoniae - MIC*    AMPICILLIN >=32 RESISTANT Resistant     CEFAZOLIN <=4 SENSITIVE Sensitive     CEFTRIAXONE <=1 SENSITIVE Sensitive     CIPROFLOXACIN <=0.25 SENSITIVE Sensitive     GENTAMICIN <=1 SENSITIVE Sensitive     IMIPENEM <=0.25 SENSITIVE Sensitive     NITROFURANTOIN 64 INTERMEDIATE Intermediate     TRIMETH/SULFA <=20 SENSITIVE Sensitive     AMPICILLIN/SULBACTAM 8 SENSITIVE Sensitive     PIP/TAZO <=4 SENSITIVE Sensitive     Extended ESBL NEGATIVE Sensitive     * >=100,000 COLONIES/mL KLEBSIELLA PNEUMONIAE   Proteus mirabilis - MIC*    AMPICILLIN 4 RESISTANT Resistant     CEFAZOLIN <=4 RESISTANT Resistant     CEFTRIAXONE <=1 RESISTANT Resistant     CIPROFLOXACIN <=0.25 SENSITIVE Sensitive     GENTAMICIN <=1 SENSITIVE Sensitive     IMIPENEM 4 SENSITIVE Sensitive     NITROFURANTOIN 256 RESISTANT Resistant     TRIMETH/SULFA <=20 SENSITIVE Sensitive     AMPICILLIN/SULBACTAM <=2 SENSITIVE Sensitive     PIP/TAZO <=4 SENSITIVE Sensitive     * >=100,000 COLONIES/mL PROTEUS MIRABILIS  Culture, blood (Routine x 2)     Status: Abnormal   Collection Time: 01/08/16  5:22 PM  Result Value Ref Range Status   Specimen Description BLOOD RIGHT ANTECUBITAL  Final   Special Requests BOTTLES DRAWN AEROBIC AND ANAEROBIC 5CC  Final   Culture  Setup Time   Final  GRAM NEGATIVE RODS AEROBIC BOTTLE ONLY CRITICAL VALUE NOTED.  VALUE IS CONSISTENT WITH PREVIOUSLY REPORTED AND CALLED VALUE.    Culture (A)  Final    PROTEUS MIRABILIS SUSCEPTIBILITIES PERFORMED ON PREVIOUS CULTURE WITHIN THE LAST 5 DAYS. Performed at [facility]    Report Status 01/11/2016 FINAL  Final  Culture, blood (Routine x 2)     Status: Abnormal   Collection Time: 01/08/16  5:27 PM  Result Value Ref Range Status   Specimen Description BLOOD BLOOD RIGHT FOREARM  Final   Special Requests BOTTLES DRAWN AEROBIC AND ANAEROBIC 5CC  Final   Culture   Setup Time   Final    GRAM NEGATIVE RODS IN BOTH AEROBIC AND ANAEROBIC BOTTLES CRITICAL RESULT CALLED TO, READ BACK BY AND VERIFIED WITH: C. SHADE, [place] ON [id number] BY Rhea Bleacher    Culture (A)  Final    PROTEUS MIRABILIS Susceptibility Pattern Suggests Possibility of an Extended Spectrum Beta Lactamase Producer. Contact Laboratory Within 7 Days if Confirmation Warranted. Performed at [facility]    Report Status 01/11/2016 FINAL  Final   Organism ID, Bacteria PROTEUS MIRABILIS  Final      Susceptibility   Proteus mirabilis - MIC*    AMPICILLIN <=2 RESISTANT Resistant     CEFAZOLIN <=4 RESISTANT Resistant     CEFEPIME <=1 RESISTANT Resistant     CEFTAZIDIME 8 RESISTANT Resistant     CEFTRIAXONE <=1 RESISTANT Resistant     CIPROFLOXACIN <=0.25 SENSITIVE Sensitive     GENTAMICIN <=1 SENSITIVE Sensitive     IMIPENEM 1 SENSITIVE Sensitive     TRIMETH/SULFA <=20 SENSITIVE Sensitive     AMPICILLIN/SULBACTAM 4 SENSITIVE Sensitive     PIP/TAZO <=4 SENSITIVE Sensitive     * PROTEUS MIRABILIS  Blood Culture ID Panel (Reflexed)     Status: Abnormal   Collection Time: 01/08/16  5:27 PM  Result Value Ref Range Status   Enterococcus species NOT DETECTED NOT DETECTED Final   Listeria monocytogenes NOT DETECTED NOT DETECTED Final   Staphylococcus species NOT DETECTED NOT DETECTED Final   Staphylococcus aureus NOT DETECTED NOT DETECTED Final   Streptococcus species NOT DETECTED NOT DETECTED Final   Streptococcus agalactiae NOT DETECTED NOT DETECTED Final   Streptococcus pneumoniae NOT DETECTED NOT DETECTED Final   Streptococcus pyogenes NOT DETECTED NOT DETECTED Final   Acinetobacter baumannii NOT DETECTED NOT DETECTED Final   Enterobacteriaceae species DETECTED (A) NOT DETECTED Final    Comment: CRITICAL RESULT CALLED TO, READ BACK BY AND VERIFIED WITH: C. SHADE, PHARM AT [phone] ON [id number] BY S. YARBROUGH    Enterobacter cloacae complex NOT DETECTED NOT DETECTED Final    Escherichia coli NOT DETECTED NOT DETECTED Final   Klebsiella oxytoca NOT DETECTED NOT DETECTED Final   Klebsiella pneumoniae NOT DETECTED NOT DETECTED Final   Proteus species DETECTED (A) NOT DETECTED Final    Comment: CRITICAL RESULT CALLED TO, READ BACK BY AND VERIFIED WITH: C. SHADE, PHARM AT 1102 ON [id number] BY S. YARBROUGH    Serratia marcescens NOT DETECTED NOT DETECTED Final   Carbapenem resistance NOT DETECTED NOT DETECTED Final   Haemophilus influenzae NOT DETECTED NOT DETECTED Final   Neisseria meningitidis NOT DETECTED NOT DETECTED Final   Pseudomonas aeruginosa NOT DETECTED NOT DETECTED Final   Candida albicans NOT DETECTED NOT DETECTED Final   Candida glabrata NOT DETECTED NOT DETECTED Final   Candida krusei NOT DETECTED NOT DETECTED Final   Candida parapsilosis NOT DETECTED NOT DETECTED Final  Candida tropicalis NOT DETECTED NOT DETECTED Final    Comment: Performed at [facility]  Urine culture     Status: Abnormal   Collection Time: 01/08/16  9:41 PM  Result Value Ref Range Status   Specimen Description URINE, CLEAN CATCH  Final   Special Requests NONE  Final   Culture (A)  Final    20,000 COLONIES/mL PROTEUS MIRABILIS SUSCEPTIBILITIES PERFORMED ON PREVIOUS CULTURE WITHIN THE LAST 5 DAYS. Performed at [facility]    Report Status 01/11/2016 FINAL  Final  Culture, blood (routine x 2)     Status: None (Preliminary result)   Collection Time: 01/10/16 11:33 AM  Result Value Ref Range Status   Specimen Description BLOOD RIGHT ANTECUBITAL  Final   Special Requests BOTTLES DRAWN AEROBIC AND ANAEROBIC 5CC EACH  Final   Culture   Final    NO GROWTH 1 DAY Performed at [facility]    Report Status PENDING  Incomplete  Culture, blood (routine x 2)     Status: None (Preliminary result)   Collection Time: 01/10/16 11:40 AM  Result Value Ref Range Status   Specimen Description BLOOD LEFT HAND  Final   Special Requests IN PEDIATRIC BOTTLE 4CC  Final    Culture   Final    NO GROWTH 1 DAY Performed at [facility]    Report Status PENDING  Incomplete  MRSA PCR Screening     Status: None   Collection Time: 01/10/16 12:21 PM  Result Value Ref Range Status   MRSA by PCR NEGATIVE NEGATIVE Final    Comment:        The GeneXpert MRSA Assay (FDA approved for NASAL specimens only), is one component of a comprehensive MRSA colonization surveillance program. It is not intended to diagnose MRSA infection nor to guide or monitor treatment for MRSA infections.     Studies/Results: No results found.  Assessment/Plan:   Postoperative day #4 cystoscopy, right double-J stent placement for infected, obstructing right ureteral stone with sepsis secondary to Proteus.  She seems to be doing well.    I will leave an hospital antibiotic management up to the medicine service.  She will need eventual follow-up in the office to plan management of her stone, which probably will be best done with cystoscopy, ureteroscopy, holmium laser and extraction of stone.   LOS: 4 days   Franchot Gallo M 01/12/2016, 8:38 AM

## 2016-01-12 NOTE — Progress Notes (Addendum)
Patient ID: Marcia [name], female   DOB: 1934/02/26, 81 y.o.   MRN: [id number]  PROGRESS NOTE    LARON ANGELINI  PIR:[id number] DOB: [birth year] DOA: 01/08/2016  PCP: Reginia Naas, MD   Brief Narrative:  81 -year-old female with past medical history significant for advanced dementia, small cell adenocarcinoma of the lung diagnosed in 2009 status post surgery and chemotherapy, diabetes, hypothyroidism, rheumatoid arthritis. She presented to ED with 2 day history of nausea, vomiting and diarrhea. Patient has chronic indwelling Foley catheter secondary to urinary retention. Per family report, patient is essentially bedridden and has 24-hour care at home. Patient was seen in ED 01/06/2016 with vomiting and diarrhea. Evaluation at that time revealed stone on imaging studies which was thought to be chronic. Urine culture was sent and patient was subsequently discharged home after receiving IV fluids. She was supposed to follow-up with urology on outpatient basis however patient's condition worsened at home and she subsequently came back with presentation of unresponsiveness in addition to fevers.  Repeat CT scan of the abdomen showed moderate right hydronephrosis and marked right dilated extrarenal pelvis and hydroureter secondary to 6 mm stone in the distal right ureter. Patient underwent urgent cystoscopy with placement of right double J stent. She was started on cefepime which was subsequently switched to Unasyn based on sensitivity report from urine and blood cultures. Urine culture on this admission with Proteus and Klebsiella and blood culture with Proteus species.  Assessment & Plan:   Principal Problem: Sepsis secondary to Proteus mirabilis and Klebsiella pneumonia UTI and proteus mirabilis bacteremia / acute pyelonephritis / leukocytosis / lactic acidosis  - Sepsis criteria met on the admission with fever, tachycardia, tachypnea, hypoxia, leukocytosis and lactic acidosis Source of infection is  acute pyelonephritis secondary to hydronephrosis - Urine culture on the admission is growing Proteus mirabilis and Klebsiella pneumonia - Blood culture on admission is growing Proteus species - Cefepime was switched to Unasyn per sensitivity report - Repeat blood cultures so far show no growth  Active Problems:   Hydronephrosis of the right kidney  - Patient is status post right ureteral stent placement for stone with hydronephrosis, post op day #3 - Per urology, patient will need follow-up after discharge for planning of ureteroscopic stone extraction    Cancer of right lung ([place]) - Status post surgery and chemotherapy    Vascular dementia, uncomplicated - Continue Aricept and Namenda    Rheumatoid arthritis involving both shoulders with positive rheumatoid factor (HCC) - Continue low-dose prednisone 5 mg daily    Essential hypertension - Continue Norvasc 10 mg daily    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 - Patient on sliding scale insulin - CBG's in past 24 hours: 244, 304, 203    Dyslipidemia associated with type 2 diabetes mellitus - Continue statin therapy and zetia     Chronic kidney disease stage III - Baseline creatinine 6 months ago is 1.05 - Creatinine within baseline range on this admission    Hypokalemia / Hypomagnesemia - Likely from sepsis - Supplemented today again  - Check BMP and magnesium level in am    Pressure injury of skin, right lower extremity - Wound care consulted, appreciate their assessment     Severe protein calorie malnutrition - In the context of chronic illness - Seen by nutritionist   DVT prophylaxis: Lovenox subQ  Code Status: full code  Family Communication: caregiver at the bedside this am; spoke with patient's husband over the phone this am  Disposition Plan: home likely in am   Consultants:   GU  Procedures:   Double J stent placed 01/08/2016   Antimicrobials:   Cefepime 01/08/2016  -->01/09/16  Unasyn 01/09/2016 -->   Subjective: No overnight events.   Objective: Vitals:   01/11/16 0559 01/11/16 1500 01/11/16 2216 01/12/16 0516  BP: (!) 125/58 (!) 120/59 140/64 138/64  Pulse: 77 87 83 76  Resp: 20 20 (!) 26 (!) 28  Temp: 98.2 F (36.8 C) 98 F (36.7 C) 98.5 F (36.9 C) 98 F (36.7 C)  TempSrc: Axillary Axillary Oral Oral  SpO2: 97% 97% 94% 93%  Weight:      Height:        Intake/Output Summary (Last 24 hours) at 01/12/16 1113 Last data filed at 01/12/16 0600  Gross per 24 hour  Intake             1650 ml  Output             1600 ml  Net               50 ml   Filed Weights   01/08/16 1625 01/08/16 2329  Weight: 71.2 kg (157 lb) 57.4 kg (126 lb 8.7 oz)    Examination:  General exam: no distress, much better this am Respiratory system: No wheezing, no rhonchi  Cardiovascular system: S1 & S2 heard, rate controlled  Gastrointestinal system: (+) BS, non tender abd  Central nervous system: No focal deficits  Extremities: No edema, wound over RLE Skin: laceration on RLE, left heel pressure injury  Psychiatry: Normal mood and behavior   Data Reviewed: I have personally reviewed following labs and imaging studies  CBC:  Recent Labs Lab 01/06/16 2055 01/08/16 1711 01/09/16 0350 01/10/16 0329 01/11/16 0609  WBC 11.7* 11.7* 8.6 6.1 5.1  NEUTROABS 9.9*  --   --   --   --   HGB 13.5 12.7 11.4* 11.1* 11.0*  HCT 39.5 38.9 34.9* 33.4* 33.0*  MCV 87.2 89.6 88.8 87.4 86.4  PLT 207 211 206 189 793   Basic Metabolic Panel:  Recent Labs Lab 01/08/16 1711 01/09/16 0350 01/10/16 0329 01/11/16 0609 01/12/16 0526  NA 138 142 143 145 143  K 3.7 3.1* 3.4* 2.9* 2.9*  CL 101 110 112* 108 106  CO2 '25 25 24 28 31  ' GLUCOSE 281* 173* 199* 198* 246*  BUN 24* '18 12 9 8  ' CREATININE 1.71* 1.05* 0.71 0.66 0.60  CALCIUM 8.4* 7.9* 7.7* 7.6* 7.7*  MG  --   --   --  1.1* 1.5*   GFR: Estimated Creatinine Clearance: 51.7 mL/min (by C-G formula based on  SCr of 0.6 mg/dL). Liver Function Tests:  Recent Labs Lab 01/06/16 2055 01/08/16 1711  AST 19 27  ALT 12* 13*  ALKPHOS 82 87  BILITOT 1.4* 1.1  PROT 5.9* 6.3*  ALBUMIN 2.8* 2.5*    Recent Labs Lab 01/06/16 2055 01/08/16 1711  LIPASE 21 28   No results for input(s): AMMONIA in the last 168 hours. Coagulation Profile: No results for input(s): INR, PROTIME in the last 168 hours. Cardiac Enzymes: No results for input(s): CKTOTAL, CKMB, CKMBINDEX, TROPONINI in the last 168 hours. BNP (last 3 results) No results for input(s): PROBNP in the last 8760 hours. HbA1C: No results for input(s): HGBA1C in the last 72 hours. CBG:  Recent Labs Lab 01/11/16 0748 01/11/16 1137 01/11/16 1656 01/11/16 2213 01/12/16 0813  GLUCAP 175* 190* 244* 304* 203*   Lipid  Profile: No results for input(s): CHOL, HDL, LDLCALC, TRIG, CHOLHDL, LDLDIRECT in the last 72 hours. Thyroid Function Tests: No results for input(s): TSH, T4TOTAL, FREET4, T3FREE, THYROIDAB in the last 72 hours. Anemia Panel: No results for input(s): VITAMINB12, FOLATE, FERRITIN, TIBC, IRON, RETICCTPCT in the last 72 hours. Urine analysis:    Component Value Date/Time   COLORURINE YELLOW 01/08/2016 1700   APPEARANCEUR TURBID (A) 01/08/2016 1700   LABSPEC 1.016 01/08/2016 1700   LABSPEC 1.005 09/21/2007 1410   PHURINE 6.5 01/08/2016 1700   GLUCOSEU NEGATIVE 01/08/2016 1700   HGBUR MODERATE (A) 01/08/2016 1700   BILIRUBINUR NEGATIVE 01/08/2016 1700   BILIRUBINUR Negative 09/21/2007 1410   KETONESUR 40 (A) 01/08/2016 1700   PROTEINUR 100 (A) 01/08/2016 1700   UROBILINOGEN 0.2 09/01/2011 1746   NITRITE POSITIVE (A) 01/08/2016 1700   LEUKOCYTESUR LARGE (A) 01/08/2016 1700   LEUKOCYTESUR Moderate 09/21/2007 1410   Sepsis Labs: '@LABRCNTIP' (procalcitonin:4,lacticidven:4)  Results for orders placed or performed during the hospital encounter of 01/08/16  Urine culture     Status: Abnormal   Collection Time: 01/08/16   5:00 PM  Result Value Ref Range Status   Specimen Description URINE, CLEAN CATCH  Final   Special Requests NONE  Final   Culture (A)  Final    >=100,000 COLONIES/mL KLEBSIELLA PNEUMONIAE >=100,000 COLONIES/mL PROTEUS MIRABILIS Susceptibility Pattern Suggests Possibility of an Extended Spectrum Beta Lactamase Producer. Contact Laboratory Within 7 Days if Confirmation Warranted. Performed at [facility]    Report Status 01/11/2016 FINAL  Final   Organism ID, Bacteria KLEBSIELLA PNEUMONIAE (A)  Final   Organism ID, Bacteria PROTEUS MIRABILIS (A)  Final      Susceptibility   Klebsiella pneumoniae - MIC*    AMPICILLIN >=32 RESISTANT Resistant     CEFAZOLIN <=4 SENSITIVE Sensitive     CEFTRIAXONE <=1 SENSITIVE Sensitive     CIPROFLOXACIN <=0.25 SENSITIVE Sensitive     GENTAMICIN <=1 SENSITIVE Sensitive     IMIPENEM <=0.25 SENSITIVE Sensitive     NITROFURANTOIN 64 INTERMEDIATE Intermediate     TRIMETH/SULFA <=20 SENSITIVE Sensitive     AMPICILLIN/SULBACTAM 8 SENSITIVE Sensitive     PIP/TAZO <=4 SENSITIVE Sensitive     Extended ESBL NEGATIVE Sensitive     * >=100,000 COLONIES/mL KLEBSIELLA PNEUMONIAE   Proteus mirabilis - MIC*    AMPICILLIN 4 RESISTANT Resistant     CEFAZOLIN <=4 RESISTANT Resistant     CEFTRIAXONE <=1 RESISTANT Resistant     CIPROFLOXACIN <=0.25 SENSITIVE Sensitive     GENTAMICIN <=1 SENSITIVE Sensitive     IMIPENEM 4 SENSITIVE Sensitive     NITROFURANTOIN 256 RESISTANT Resistant     TRIMETH/SULFA <=20 SENSITIVE Sensitive     AMPICILLIN/SULBACTAM <=2 SENSITIVE Sensitive     PIP/TAZO <=4 SENSITIVE Sensitive     * >=100,000 COLONIES/mL PROTEUS MIRABILIS  Culture, blood (Routine x 2)     Status: Abnormal   Collection Time: 01/08/16  5:22 PM  Result Value Ref Range Status   Specimen Description BLOOD RIGHT ANTECUBITAL  Final   Special Requests BOTTLES DRAWN AEROBIC AND ANAEROBIC 5CC  Final   Culture  Setup Time   Final    GRAM NEGATIVE RODS AEROBIC BOTTLE  ONLY CRITICAL VALUE NOTED.  VALUE IS CONSISTENT WITH PREVIOUSLY REPORTED AND CALLED VALUE.    Culture (A)  Final    PROTEUS MIRABILIS SUSCEPTIBILITIES PERFORMED ON PREVIOUS CULTURE WITHIN THE LAST 5 DAYS. Performed at [facility]    Report Status 01/11/2016 FINAL  Final  Culture, blood (Routine x 2)     Status: Abnormal   Collection Time: 01/08/16  5:27 PM  Result Value Ref Range Status   Specimen Description BLOOD BLOOD RIGHT FOREARM  Final   Special Requests BOTTLES DRAWN AEROBIC AND ANAEROBIC 5CC  Final   Culture  Setup Time   Final    GRAM NEGATIVE RODS IN BOTH AEROBIC AND ANAEROBIC BOTTLES CRITICAL RESULT CALLED TO, READ BACK BY AND VERIFIED WITH: C. SHADE, [place] [id number] BY Rhea Bleacher    Culture (A)  Final    PROTEUS MIRABILIS Susceptibility Pattern Suggests Possibility of an Extended Spectrum Beta Lactamase Producer. Contact Laboratory Within 7 Days if Confirmation Warranted. Performed at [facility]    Report Status 01/11/2016 FINAL  Final   Organism ID, Bacteria PROTEUS MIRABILIS  Final      Susceptibility   Proteus mirabilis - MIC*    AMPICILLIN <=2 RESISTANT Resistant     CEFAZOLIN <=4 RESISTANT Resistant     CEFEPIME <=1 RESISTANT Resistant     CEFTAZIDIME 8 RESISTANT Resistant     CEFTRIAXONE <=1 RESISTANT Resistant     CIPROFLOXACIN <=0.25 SENSITIVE Sensitive     GENTAMICIN <=1 SENSITIVE Sensitive     IMIPENEM 1 SENSITIVE Sensitive     TRIMETH/SULFA <=20 SENSITIVE Sensitive     AMPICILLIN/SULBACTAM 4 SENSITIVE Sensitive     PIP/TAZO <=4 SENSITIVE Sensitive     * PROTEUS MIRABILIS  Blood Culture ID Panel (Reflexed)     Status: Abnormal   Collection Time: 01/08/16  5:27 PM  Result Value Ref Range Status   Enterococcus species NOT DETECTED NOT DETECTED Final   Listeria monocytogenes NOT DETECTED NOT DETECTED Final   Staphylococcus species NOT DETECTED NOT DETECTED Final   Staphylococcus aureus NOT DETECTED NOT DETECTED Final    Streptococcus species NOT DETECTED NOT DETECTED Final   Streptococcus agalactiae NOT DETECTED NOT DETECTED Final   Streptococcus pneumoniae NOT DETECTED NOT DETECTED Final   Streptococcus pyogenes NOT DETECTED NOT DETECTED Final   Acinetobacter baumannii NOT DETECTED NOT DETECTED Final   Enterobacteriaceae species DETECTED (A) NOT DETECTED Final    Comment: CRITICAL RESULT CALLED TO, READ BACK BY AND VERIFIED WITH: C. SHADE, PHARM AT [id number] ON [id number] BY S. YARBROUGH    Enterobacter cloacae complex NOT DETECTED NOT DETECTED Final   Escherichia coli NOT DETECTED NOT DETECTED Final   Klebsiella oxytoca NOT DETECTED NOT DETECTED Final   Klebsiella pneumoniae NOT DETECTED NOT DETECTED Final   Proteus species DETECTED (A) NOT DETECTED Final    Comment: CRITICAL RESULT CALLED TO, READ BACK BY AND VERIFIED WITH: C. SHADE, PHARM AT 1102 ON [id number] BY S. YARBROUGH    Serratia marcescens NOT DETECTED NOT DETECTED Final   Carbapenem resistance NOT DETECTED NOT DETECTED Final   Haemophilus influenzae NOT DETECTED NOT DETECTED Final   Neisseria meningitidis NOT DETECTED NOT DETECTED Final   Pseudomonas aeruginosa NOT DETECTED NOT DETECTED Final   Candida albicans NOT DETECTED NOT DETECTED Final   Candida glabrata NOT DETECTED NOT DETECTED Final   Candida krusei NOT DETECTED NOT DETECTED Final   Candida parapsilosis NOT DETECTED NOT DETECTED Final   Candida tropicalis NOT DETECTED NOT DETECTED Final    Comment: Performed at [facility]  Urine culture     Status: Abnormal   Collection Time: 01/08/16  9:41 PM  Result Value Ref Range Status   Specimen Description URINE, CLEAN CATCH  Final   Special Requests NONE  Final  Culture (A)  Final    20,000 COLONIES/mL PROTEUS MIRABILIS SUSCEPTIBILITIES PERFORMED ON PREVIOUS CULTURE WITHIN THE LAST 5 DAYS. Performed at [facility]    Report Status 01/11/2016 FINAL  Final  Culture, blood (routine x 2)     Status: None (Preliminary result)    Collection Time: 01/10/16 11:33 AM  Result Value Ref Range Status   Specimen Description BLOOD RIGHT ANTECUBITAL  Final   Special Requests BOTTLES DRAWN AEROBIC AND ANAEROBIC 5CC EACH  Final   Culture   Final    NO GROWTH 1 DAY Performed at [facility]    Report Status PENDING  Incomplete  Culture, blood (routine x 2)     Status: None (Preliminary result)   Collection Time: 01/10/16 11:40 AM  Result Value Ref Range Status   Specimen Description BLOOD LEFT HAND  Final   Special Requests IN PEDIATRIC BOTTLE 4CC  Final   Culture   Final    NO GROWTH 1 DAY Performed at [facility]    Report Status PENDING  Incomplete  MRSA PCR Screening     Status: None   Collection Time: 01/10/16 12:21 PM  Result Value Ref Range Status   MRSA by PCR NEGATIVE NEGATIVE Final    Comment:        The GeneXpert MRSA Assay (FDA approved for NASAL specimens only), is one component of a comprehensive MRSA colonization surveillance program. It is not intended to diagnose MRSA infection nor to guide or monitor treatment for MRSA infections.       Radiology Studies: Ct Abdomen Pelvis Wo Contrast Result Date: 01/08/2016 1. Severe right obstructive uropathy with complete obstruction of the right ureter at the ureterovesical junction secondary to presence of 6 mm stone. Severe right hydroureteronephrosis with excreted contrast from the scan of 2 days ago filling the renal pelvis and right ureter. 2. Striated nephrogram appearance of the right kidney is likely secondary to acute kidney injury caused by the above described ureteral obstruction. 3. Small right pleural effusion. Electronically Signed   By: Ulyses Jarred M.D.   On: 01/08/2016 19:00   Dg Chest 2 View Result Date: 01/08/2016  1. Postsurgical changes of the right upper lung. Slight increased density in the region of the surgical sutures/presumed scarring on AP view, cannot exclude acute infiltrate or other airspace abnormality in  the region. 2. Mild cardiomegaly with mild central vascular congestion. Electronically Signed   By: Donavan Foil M.D.   On: 01/08/2016 18:21   Ct Abdomen Pelvis W Contrast Result Date: 01/06/2016 1. Moderate right hydronephrosis and marked right dilated extra renal pelvis and hydroureter secondary to a 6 mm stone in the distal right ureter, just above the right UVJ. No left hydronephrosis. 2. Small right posterior diaphragmatic hernia which contains fat. 3. Nonspecific mild hazy attenuation within the central mesenteries, possibly related to mild mesenteric edema. 4. Mild rectal asymmetric thickening, could be due to proctitis or possible rectal mass, recommend correlation with physical examination. Electronically Signed   By: Donavan Foil M.D.   On: 01/06/2016 22:29      Scheduled Meds: . amLODipine  10 mg Oral Daily  . aspirin EC  81 mg Oral Daily  . atorvastatin  40 mg Oral Daily  . calcium-vitamin D  1 tablet Oral BID  . donepezil  23 mg Oral QHS  . enoxaparin (LOVENOX) injection  40 mg Subcutaneous Daily  . ezetimibe  10 mg Oral Daily  . feeding supplement (ENSURE ENLIVE)  237 mL Oral BID BM  . ferrous sulfate  325 mg Oral BID WC  . imipenem-cilastatin  500 mg Intravenous Q8H  . insulin aspart  0-15 Units Subcutaneous TID WC  . insulin aspart  0-5 Units Subcutaneous QHS  . magnesium sulfate 1 - 4 g bolus IVPB  2 g Intravenous Once  . memantine  10 mg Oral BID  . multivitamin with minerals  1 tablet Oral Daily  . potassium chloride  40 mEq Oral BID  . predniSONE  5 mg Oral Daily   Continuous Infusions: . sodium chloride 50 mL/hr at 01/11/16 2004     LOS: 4 days    Time spent: 25 minutes  Greater than 50% of the time spent on counseling and coordinating the care.   Leisa Lenz, MD [facility] Pager [phone]  If 7PM-7AM, please contact night-coverage [email or web address] Password [email or web address] 01/12/2016, 11:13 AM

## 2016-01-13 ENCOUNTER — Encounter (HOSPITAL_COMMUNITY): Payer: Self-pay | Admitting: Urology

## 2016-01-13 DIAGNOSIS — F0390 Unspecified dementia without behavioral disturbance: Secondary | ICD-10-CM

## 2016-01-13 DIAGNOSIS — E43 Unspecified severe protein-calorie malnutrition: Secondary | ICD-10-CM

## 2016-01-13 LAB — BASIC METABOLIC PANEL
Anion gap: 5 (ref 5–15)
BUN: 7 mg/dL (ref 6–20)
CALCIUM: 7.8 mg/dL — AB (ref 8.9–10.3)
CHLORIDE: 104 mmol/L (ref 101–111)
CO2: 31 mmol/L (ref 22–32)
CREATININE: 0.61 mg/dL (ref 0.44–1.00)
Glucose, Bld: 221 mg/dL — ABNORMAL HIGH (ref 65–99)
Potassium: 3.1 mmol/L — ABNORMAL LOW (ref 3.5–5.1)
SODIUM: 140 mmol/L (ref 135–145)

## 2016-01-13 LAB — GLUCOSE, CAPILLARY
GLUCOSE-CAPILLARY: 203 mg/dL — AB (ref 65–99)
Glucose-Capillary: 303 mg/dL — ABNORMAL HIGH (ref 65–99)

## 2016-01-13 LAB — MAGNESIUM: MAGNESIUM: 1.8 mg/dL (ref 1.7–2.4)

## 2016-01-13 MED ORDER — POTASSIUM CHLORIDE CRYS ER 20 MEQ PO TBCR
40.0000 meq | EXTENDED_RELEASE_TABLET | Freq: Once | ORAL | Status: AC
  Administered 2016-01-13: 40 meq via ORAL
  Filled 2016-01-13: qty 2

## 2016-01-13 MED ORDER — AMOXICILLIN-POT CLAVULANATE 875-125 MG PO TABS: 1.0000 | ORAL_TABLET | Freq: Two times a day (BID) | ORAL | 0 refills | Status: AC

## 2016-01-13 MED ORDER — INSULIN GLARGINE 100 UNIT/ML ~~LOC~~ SOLN
5.0000 [IU] | Freq: Every day | SUBCUTANEOUS | Status: DC
  Administered 2016-01-13: 5 [IU] via SUBCUTANEOUS
  Filled 2016-01-13: qty 0.05

## 2016-01-13 MED ORDER — ENSURE ENLIVE PO LIQD: 237.0000 mL | Freq: Two times a day (BID) | ORAL | 12 refills | Status: AC

## 2016-01-13 MED ORDER — POTASSIUM CHLORIDE ER 10 MEQ PO TBCR: 10.0000 meq | EXTENDED_RELEASE_TABLET | Freq: Every day | ORAL | 0 refills | Status: AC

## 2016-01-13 NOTE — Discharge Summary (Signed)
Physician Discharge Summary  Marcia [name] JME:[id number] DOB: Jun 08, 1934 DOA: 01/08/2016  PCP: Reginia Naas, MD  Admit date: 01/08/2016 Discharge date: 01/13/2016  Recommendations for Outpatient Follow-up:  Continue Augmentin for 9 more days on discharge  F/U with urology per scheduled appt Continue potassium for 5 days on discharge  Discharge Diagnoses:  Principal Problem:   Acute pyonephrosis Active Problems:   Cancer of right lung ([place])   Vascular dementia, uncomplicated   Rheumatoid arthritis involving both shoulders with positive rheumatoid factor ([place])   Subcortical vascular dementia   Dementia   Diabetes ([place])   Nephrolithiasis   Acute kidney injury ([place])   Obstructive uropathy   Pressure injury of skin   Protein-calorie malnutrition, severe    Discharge Condition: stable   Diet recommendation: as tolerated   History of present illness:  81 -year-old female with past medical history significant for advanced dementia, small cell adenocarcinoma of the lung diagnosed in 2009 status post surgery and chemotherapy, diabetes, hypothyroidism, rheumatoid arthritis. She presented to ED with 2 day history of nausea, vomiting and diarrhea. Patient has chronic indwelling Foley catheter secondary to urinary retention. Per family report, patient is essentially bedridden and has 24-hour care at home. Patient was seen in ED 01/06/2016 with vomiting and diarrhea. Evaluation at that time revealed stone on imaging studies which was thought to be chronic. Urine culture was sent and patient was subsequently discharged home after receiving IV fluids. She was supposed to follow-up with urology on outpatient basis however patient's condition worsened at home and she subsequently came back with presentation of unresponsiveness in addition to fevers.  Repeat CT scan of the abdomen showed moderate right hydronephrosis and marked right dilated extrarenal pelvis and hydroureter secondary to 6 mm  stone in the distal right ureter. Patient underwent urgent cystoscopy with placement of right double J stent. She was started on cefepime which was subsequently switched to Unasyn based on sensitivity report from urine and blood cultures. Urine culture on this admission with Proteus and Klebsiella and blood culture with Proteus species.  Hospital Course:    Assessment & Plan:   Principal Problem: Sepsis secondary to Proteus mirabilis and Klebsiella pneumonia UTI and proteus mirabilis bacteremia / acute pyelonephritis / leukocytosis / lactic acidosis  - Sepsis criteria met on the admission with fever, tachycardia, tachypnea, hypoxia, leukocytosis and lactic acidosis Source of infection is acute pyelonephritis secondary to hydronephrosis - Urine culture on the admission is growing Proteus mirabilis and Klebsiella pneumonia - Blood culture on the admission is growing Proteus species - Cefepime was switched to Unasyn per sensitivity report - Repeat blood cultures so far show no growth - Patient will continue Augmentin for 9 more days on discharge which will complete total of 14 days of antibiotic treatment  Active Problems:   Hydronephrosis of the right kidney  - Patient is status post right ureteral stent placement for stone with hydronephrosis, post op day #3 - Per urology, patient will need follow-up after discharge for planning of ureteroscopic stone extraction    Cancer of right lung ([place]) - Status post surgery and chemotherapy    Vascular dementia, uncomplicated - Continue Aricept and Namenda    Rheumatoid arthritis involving both shoulders with positive rheumatoid factor ([facility]) - Continue low-dose prednisone 5 mg daily    Essential hypertension - Continue Norvasc 10 mg daily    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 - Continue home insulin regimen    Dyslipidemia associated with type 2 diabetes mellitus -  Continue statin therapy and zetia      Chronic kidney disease stage III - Baseline creatinine 6 months ago is 1.05 - Creatinine within baseline range on this admission    Hypokalemia / Hypomagnesemia - Likely from sepsis - Supplemented today again  - Mag level normal this am - Potassium slightly low so patient will continue 5 days of potassium supplemental    Pressure injury of skin, right lower extremity - Wound care consulted, appreciate their assessment     Severe protein calorie malnutrition - In the context of chronic illness - Seen by nutritionist   DVT prophylaxis: Lovenox subQ  Code Status: full code  Family Communication: caregiver at the bedside this am; spoke with patient's husband over the phone this am    Consultants:   GU  Procedures:   Double J stent placed 01/08/2016   Antimicrobials:   Cefepime 01/08/2016 -->01/09/16  Unasyn 01/09/2016 --> 01/13/2016   Signed:  Leisa Lenz, MD  [facility] 01/13/2016, 11:38 AM  Pager #: [phone]  Time spent in minutes: more than 30 minutes    Discharge Exam: Vitals:   01/13/16 0503 01/13/16 1006  BP: (!) 145/56 (!) 144/64  Pulse: 79   Resp: (!) 22   Temp: 98.2 F (36.8 C)    Vitals:   01/12/16 1428 01/12/16 2219 01/13/16 0503 01/13/16 1006  BP: (!) 128/58 137/60 (!) 145/56 (!) 144/64  Pulse: 86 73 79   Resp: 20 20 (!) 22   Temp: 98.8 F (37.1 C) 99.2 F (37.3 C) 98.2 F (36.8 C)   TempSrc: Oral Oral Oral   SpO2: 96% 95% 95%   Weight:      Height:        General: Pt is alert, follows commands appropriately, not in acute distress Cardiovascular: Regular rate and rhythm, S1/S2 +, no murmurs Respiratory: Clear to auscultation bilaterally, no wheezing, no crackles, no rhonchi Abdominal: Soft, non tender, non distended, bowel sounds +, no guarding Extremities: no edema, no cyanosis, pulses palpable bilaterally DP and PT Neuro: Grossly nonfocal  Discharge Instructions  Discharge Instructions    Call  MD for:  persistant nausea and vomiting    Complete by:  As directed    Call MD for:  redness, tenderness, or signs of infection (pain, swelling, redness, odor or green/yellow discharge around incision site)    Complete by:  As directed    Call MD for:  severe uncontrolled pain    Complete by:  As directed    Diet - low sodium heart healthy    Complete by:  As directed    Discharge instructions    Complete by:  As directed    Continue Augmentin for 9 more days on discharge  F/U with urology per scheduled appt Continue potassium for 5 days on discharge   Increase activity slowly    Complete by:  As directed        Medication List    TAKE these medications   ALPRAZolam 0.5 MG tablet Commonly known as:  XANAX Take 1 tablet by mouth daily as needed for anxiety.   amLODipine 10 MG tablet Commonly known as:  NORVASC Take 1 tablet (10 mg total) by mouth daily.   amoxicillin-clavulanate 875-125 MG tablet Commonly known as:  AUGMENTIN Take 1 tablet by mouth 2 (two) times daily.   aspirin EC 81 MG tablet Take 81 mg by mouth daily.   atorvastatin 40 MG tablet Commonly known as:  LIPITOR Take 40 mg by mouth  daily.   calcium-vitamin D 500-200 MG-UNIT tablet Take 1 tablet by mouth 2 (two) times daily.   docusate sodium 100 MG capsule Commonly known as:  COLACE Take 100-200 mg by mouth daily.   donepezil 23 MG Tabs tablet Commonly known as:  ARICEPT TAKE 1 TABLET (23 MG TOTAL) BY MOUTH AT BEDTIME.   ezetimibe 10 MG tablet Commonly known as:  ZETIA Take 10 mg by mouth daily.   feeding supplement (ENSURE ENLIVE) Liqd Take 237 mLs by mouth 2 (two) times daily between meals.   ferrous sulfate 325 (65 FE) MG tablet Take 1 tablet (325 mg total) by mouth 2 (two) times daily with a meal.   HUMALOG MIX 75/25 KWIKPEN (75-25) 100 UNIT/ML Kwikpen Generic drug:  Insulin Lispro Prot & Lispro Inject 12-30 Units into the skin 2 (two) times daily as needed (high blood sugar). Sliding  scale.   LORazepam 0.5 MG tablet Commonly known as:  ATIVAN Take 1 tablet (0.5 mg total) by mouth 2 (two) times daily as needed.   Melatonin 5 MG Tabs Take 10 mg by mouth at bedtime.   memantine 10 MG tablet Commonly known as:  NAMENDA TAKE 1 TABLET BY MOUTH TWO TIMES DAILY   mirtazapine 7.5 MG tablet Commonly known as:  REMERON Take 7.5 mg by mouth at bedtime.   ondansetron 8 MG disintegrating tablet Commonly known as:  ZOFRAN ODT Take 1 tablet (8 mg total) by mouth every 8 (eight) hours as needed for nausea or vomiting.   polyethylene glycol packet Commonly known as:  MIRALAX / GLYCOLAX Take 17 g by mouth 2 (two) times daily. What changed:  when to take this  reasons to take this   potassium chloride 10 MEQ tablet Commonly known as:  K-DUR Take 1 tablet (10 mEq total) by mouth daily.   predniSONE 5 MG tablet Commonly known as:  DELTASONE Take 5 mg by mouth daily.   sodium phosphate enema Place 1 enema rectally daily as needed (constipation). follow package directions   tamsulosin 0.4 MG Caps capsule Commonly known as:  FLOMAX Take 1 capsule (0.4 mg total) by mouth daily.   Vitamin D (Ergocalciferol) 50000 units Caps capsule Commonly known as:  DRISDOL Take 1 capsule by mouth once a week.      Follow-up Information    DAHLSTEDT, Lillette Boxer, MD Follow up.   Specialty:  Urology Why:  Call for appointment following discharge-she should be seen within 2-3 weeks after discharge. Contact information: [place] [address] [phone]        Reginia Naas, MD. Schedule an appointment as soon as possible for a visit in 1 week(s).   Specialty:  Family Medicine Contact information: [place] [address] [phone]            The results of significant diagnostics from this hospitalization (including imaging, microbiology, ancillary and laboratory) are listed below for reference.    Significant  Diagnostic Studies: Ct Abdomen Pelvis Wo Contrast  Result Date: 01/08/2016 CLINICAL DATA:  Abdominal pain. EXAM: CT ABDOMEN AND PELVIS WITHOUT CONTRAST TECHNIQUE: Multidetector CT imaging of the abdomen and pelvis was performed following the standard protocol without IV contrast. COMPARISON:  CT abdomen pelvis 01/06/2016 FINDINGS: Lower chest: Small right pleural effusion. Bibasilar dependent atelectasis. No nodules or masses. Hepatobiliary: Unremarkable noncontrast appearance of the liver. There is vicarious excretion of contrast within the gallbladder. Pancreas: The pancreas is atrophic and largely fatty replaced. Spleen: Normal Adrenal glands:  Normal Urinary Tract: --Right kidney: The right kidney in ureter are completely obstructed at the ureterovesical junction by the previously identified 6 mm calculus. There is excreted IV contrast filling the right renal pelvis and right ureter, both of which are dilated. There is a striated nephrogram appearance of the right kidney. --Left kidney: No hydronephrosis or perinephric stranding. No nephrolithiasis. No obstructing ureteral stones. --Urinary bladder: Decompressed by Foley catheter. Stomach/Bowel: No dilated loops of bowel. No evidence of colonic or enteric inflammation. No fluid collection within the abdomen. Vascular/Lymphatic: There is atherosclerotic calcification of the non aneurysmal abdominal aorta. No abdominal or pelvic lymphadenopathy. Reproductive: Normal uterus.  No adnexal mass. Musculoskeletal. Intra medullary left femoral rod. Multilevel lumbar osteophytosis. No bony spinal canal stenosis. IMPRESSION: 1. Severe right obstructive uropathy with complete obstruction of the right ureter at the ureterovesical junction secondary to presence of 6 mm stone. Severe right hydroureteronephrosis with excreted contrast from the scan of 2 days ago filling the renal pelvis and right ureter. 2. Striated nephrogram appearance of the right kidney is likely  secondary to acute kidney injury caused by the above described ureteral obstruction. 3. Small right pleural effusion. Electronically Signed   By: Ulyses Jarred M.D.   On: 01/08/2016 19:00   Dg Chest 2 View  Result Date: 01/08/2016 CLINICAL DATA:  Altered mental status with fever history of lung cancer EXAM: CHEST  2 VIEW COMPARISON:  07/12/2015, CT chest 04/03/2014, radiograph 11/30/2007 FINDINGS: Postsurgical changes of the right upper lung zone with mild volume loss and shift of mediastinal contents to the right. Stellate density in this region, slightly increased on AP view. A wedge-shaped posterior focal opacity is unchanged. No acute consolidations or effusions. Stable chronic pleural thickening on the right. Stable cardiomediastinal silhouette with borderline to mild cardiomegaly. Atherosclerosis. No pneumothorax. IMPRESSION: 1. Postsurgical changes of the right upper lung. Slight increased density in the region of the surgical sutures/presumed scarring on AP view, cannot exclude acute infiltrate or other airspace abnormality in the region. 2. Mild cardiomegaly with mild central vascular congestion. Electronically Signed   By: Donavan Foil M.D.   On: 01/08/2016 18:21   Ct Abdomen Pelvis W Contrast  Result Date: 01/06/2016 CLINICAL DATA:  Left lower quadrant pain EXAM: CT ABDOMEN AND PELVIS WITH CONTRAST TECHNIQUE: Multidetector CT imaging of the abdomen and pelvis was performed using the standard protocol following bolus administration of intravenous contrast. CONTRAST:  151m ISOVUE-300 IOPAMIDOL (ISOVUE-300) INJECTION 61% COMPARISON:  Ultrasound 10/28/2010 FINDINGS: Lower chest: There is no significant pleural effusion. There is mild subpleural fibrosis within the posterior lower lobes along with linear scarring within the bilateral lung bases. Coronary artery calcifications. Small right posterior diaphragmatic hernia with protrusion of fat into the lower posterior chest. Hepatobiliary: No biliary  dilatation. Probable focal fatty infiltration near the falciform ligament. No calcified gallstones. Pancreas: Pancreas is atrophic but otherwise unremarkable. Spleen: Normal in size without focal abnormality. Adrenals/Urinary Tract: Bilateral adrenal glands are within normal limits. Left kidney is within normal limits. There is moderate hydronephrosis and dilated extrarenal pelvis on the right. Moderate-to-marked right hydroureter, secondary to a 6 mm stone in the distal right ureter, just above the right UVJ. No left-sided hydronephrosis. The bladder contains a Foley catheter and appears slightly thick walled. Stomach/Bowel: There is no dilatation of the stomach. There is no dilated small bowel. Mild rectal wall thickening. Vascular/Lymphatic: Dense atherosclerosis of non aneurysmal abdominal aorta and branch vessels. No significant retroperitoneal adenopathy. Reproductive: Status post hysterectomy. No adnexal masses. Other: Minimal presacral  edema and soft tissue stranding. Mild hazy attenuation within the central mesentery. No free air. Musculoskeletal: Degenerative changes, most notable at L5-S1. Status post left proximal femur intra medullary rod. IMPRESSION: 1. Moderate right hydronephrosis and marked right dilated extra renal pelvis and hydroureter secondary to a 6 mm stone in the distal right ureter, just above the right UVJ. No left hydronephrosis. 2. Small right posterior diaphragmatic hernia which contains fat. 3. Nonspecific mild hazy attenuation within the central mesenteries, possibly related to mild mesenteric edema. 4. Mild rectal asymmetric thickening, could be due to proctitis or possible rectal mass, recommend correlation with physical examination. Electronically Signed   By: Donavan Foil M.D.   On: 01/06/2016 22:29    Microbiology: Recent Results (from the past 240 hour(s))  Urine culture     Status: Abnormal   Collection Time: 01/06/16  9:07 PM  Result Value Ref Range Status   Specimen  Description URINE, RANDOM  Final   Special Requests NONE  Final   Culture (A)  Final    >=100,000 COLONIES/mL PROTEUS MIRABILIS >=100,000 COLONIES/mL KLEBSIELLA PNEUMONIAE    Report Status 01/09/2016 FINAL  Final   Organism ID, Bacteria KLEBSIELLA PNEUMONIAE (A)  Final   Organism ID, Bacteria PROTEUS MIRABILIS (A)  Final      Susceptibility   Klebsiella pneumoniae - MIC*    AMPICILLIN >=32 RESISTANT Resistant     CEFAZOLIN <=4 SENSITIVE Sensitive     CEFEPIME <=1 SENSITIVE Sensitive     CEFTRIAXONE <=1 SENSITIVE Sensitive     CIPROFLOXACIN <=0.25 SENSITIVE Sensitive     GENTAMICIN <=1 SENSITIVE Sensitive     IMIPENEM <=0.25 SENSITIVE Sensitive     NITROFURANTOIN 64 INTERMEDIATE Intermediate     TRIMETH/SULFA <=20 SENSITIVE Sensitive     AMPICILLIN/SULBACTAM 8 SENSITIVE Sensitive     PIP/TAZO <=4 SENSITIVE Sensitive     Extended ESBL Value in next row Sensitive      NEGATIVEPerformed at [facility]    * >=100,000 COLONIES/mL KLEBSIELLA PNEUMONIAE   Proteus mirabilis - MIC*    AMPICILLIN Value in next row Resistant      NEGATIVEPerformed at [facility]    CEFAZOLIN Value in next row Resistant      NEGATIVEPerformed at [facility]    CEFEPIME Value in next row Resistant      NEGATIVEPerformed at [facility]    CEFTRIAXONE Value in next row Resistant      NEGATIVEPerformed at [facility]    CIPROFLOXACIN Value in next row Sensitive      NEGATIVEPerformed at [place] Value in next row Sensitive      NEGATIVEPerformed at [facility]    IMIPENEM Value in next row Sensitive      NEGATIVEPerformed at [place] Value in next row Resistant      NEGATIVEPerformed at [facility]    TRIMETH/SULFA Value in next row Sensitive      NEGATIVEPerformed at [facility]    AMPICILLIN/SULBACTAM Value in next row Sensitive      NEGATIVEPerformed at [facility]    PIP/TAZO  Value in next row Sensitive      NEGATIVEPerformed at [facility]    * >=100,000 COLONIES/mL PROTEUS MIRABILIS  Urine culture     Status: Abnormal   Collection Time: 01/08/16  5:00 PM  Result Value Ref Range Status   Specimen Description URINE, [place]  Final  Special Requests NONE  Final   Culture (A)  Final    >=100,000 COLONIES/mL KLEBSIELLA PNEUMONIAE >=100,000 COLONIES/mL PROTEUS MIRABILIS Susceptibility Pattern Suggests Possibility of an Extended Spectrum Beta Lactamase Producer. Contact Laboratory Within 7 Days if Confirmation Warranted. Performed at [facility]    Report Status 01/11/2016 FINAL  Final   Organism ID, Bacteria KLEBSIELLA PNEUMONIAE (A)  Final   Organism ID, Bacteria PROTEUS MIRABILIS (A)  Final      Susceptibility   Klebsiella pneumoniae - MIC*    AMPICILLIN >=32 RESISTANT Resistant     CEFAZOLIN <=4 SENSITIVE Sensitive     CEFTRIAXONE <=1 SENSITIVE Sensitive     CIPROFLOXACIN <=0.25 SENSITIVE Sensitive     GENTAMICIN <=1 SENSITIVE Sensitive     IMIPENEM <=0.25 SENSITIVE Sensitive     NITROFURANTOIN 64 INTERMEDIATE Intermediate     TRIMETH/SULFA <=20 SENSITIVE Sensitive     AMPICILLIN/SULBACTAM 8 SENSITIVE Sensitive     PIP/TAZO <=4 SENSITIVE Sensitive     Extended ESBL NEGATIVE Sensitive     * >=100,000 COLONIES/mL KLEBSIELLA PNEUMONIAE   Proteus mirabilis - MIC*    AMPICILLIN 4 RESISTANT Resistant     CEFAZOLIN <=4 RESISTANT Resistant     CEFTRIAXONE <=1 RESISTANT Resistant     CIPROFLOXACIN <=0.25 SENSITIVE Sensitive     GENTAMICIN <=1 SENSITIVE Sensitive     IMIPENEM 4 SENSITIVE Sensitive     NITROFURANTOIN 256 RESISTANT Resistant     TRIMETH/SULFA <=20 SENSITIVE Sensitive     AMPICILLIN/SULBACTAM <=2 SENSITIVE Sensitive     PIP/TAZO <=4 SENSITIVE Sensitive     * >=100,000 COLONIES/mL PROTEUS MIRABILIS  Culture, blood (Routine x 2)     Status: Abnormal   Collection Time: 01/08/16  5:22 PM  Result Value Ref Range Status    Specimen Description BLOOD RIGHT ANTECUBITAL  Final   Special Requests BOTTLES DRAWN AEROBIC AND ANAEROBIC 5CC  Final   Culture  Setup Time   Final    GRAM NEGATIVE RODS AEROBIC BOTTLE ONLY CRITICAL VALUE NOTED.  VALUE IS CONSISTENT WITH PREVIOUSLY REPORTED AND CALLED VALUE.    Culture (A)  Final    PROTEUS MIRABILIS SUSCEPTIBILITIES PERFORMED ON PREVIOUS CULTURE WITHIN THE LAST 5 DAYS. Performed at [facility]    Report Status 01/11/2016 FINAL  Final  Culture, blood (Routine x 2)     Status: Abnormal   Collection Time: 01/08/16  5:27 PM  Result Value Ref Range Status   Specimen Description BLOOD BLOOD RIGHT FOREARM  Final   Special Requests BOTTLES DRAWN AEROBIC AND ANAEROBIC 5CC  Final   Culture  Setup Time   Final    GRAM NEGATIVE RODS IN BOTH AEROBIC AND ANAEROBIC BOTTLES CRITICAL RESULT CALLED TO, READ BACK BY AND VERIFIED WITH: C. SHADE, [place] ON [id number] BY Rhea Bleacher    Culture (A)  Final    PROTEUS MIRABILIS Susceptibility Pattern Suggests Possibility of an Extended Spectrum Beta Lactamase Producer. Contact Laboratory Within 7 Days if Confirmation Warranted. Performed at [facility]    Report Status 01/11/2016 FINAL  Final   Organism ID, Bacteria PROTEUS MIRABILIS  Final      Susceptibility   Proteus mirabilis - MIC*    AMPICILLIN <=2 RESISTANT Resistant     CEFAZOLIN <=4 RESISTANT Resistant     CEFEPIME <=1 RESISTANT Resistant     CEFTAZIDIME 8 RESISTANT Resistant     CEFTRIAXONE <=1 RESISTANT Resistant     CIPROFLOXACIN <=0.25 SENSITIVE Sensitive     GENTAMICIN <=1  SENSITIVE Sensitive     IMIPENEM 1 SENSITIVE Sensitive     TRIMETH/SULFA <=20 SENSITIVE Sensitive     AMPICILLIN/SULBACTAM 4 SENSITIVE Sensitive     PIP/TAZO <=4 SENSITIVE Sensitive     * PROTEUS MIRABILIS  Blood Culture ID Panel (Reflexed)     Status: Abnormal   Collection Time: 01/08/16  5:27 PM  Result Value Ref Range Status   Enterococcus species NOT DETECTED NOT  DETECTED Final   Listeria monocytogenes NOT DETECTED NOT DETECTED Final   Staphylococcus species NOT DETECTED NOT DETECTED Final   Staphylococcus aureus NOT DETECTED NOT DETECTED Final   Streptococcus species NOT DETECTED NOT DETECTED Final   Streptococcus agalactiae NOT DETECTED NOT DETECTED Final   Streptococcus pneumoniae NOT DETECTED NOT DETECTED Final   Streptococcus pyogenes NOT DETECTED NOT DETECTED Final   Acinetobacter baumannii NOT DETECTED NOT DETECTED Final   Enterobacteriaceae species DETECTED (A) NOT DETECTED Final    Comment: CRITICAL RESULT CALLED TO, READ BACK BY AND VERIFIED WITH: C. SHADE, PHARM AT [id number] ON [id number] BY S. YARBROUGH    Enterobacter cloacae complex NOT DETECTED NOT DETECTED Final   Escherichia coli NOT DETECTED NOT DETECTED Final   Klebsiella oxytoca NOT DETECTED NOT DETECTED Final   Klebsiella pneumoniae NOT DETECTED NOT DETECTED Final   Proteus species DETECTED (A) NOT DETECTED Final    Comment: CRITICAL RESULT CALLED TO, READ BACK BY AND VERIFIED WITH: C. SHADE, PHARM AT 1102 ON [id number] BY S. YARBROUGH    Serratia marcescens NOT DETECTED NOT DETECTED Final   Carbapenem resistance NOT DETECTED NOT DETECTED Final   Haemophilus influenzae NOT DETECTED NOT DETECTED Final   Neisseria meningitidis NOT DETECTED NOT DETECTED Final   Pseudomonas aeruginosa NOT DETECTED NOT DETECTED Final   Candida albicans NOT DETECTED NOT DETECTED Final   Candida glabrata NOT DETECTED NOT DETECTED Final   Candida krusei NOT DETECTED NOT DETECTED Final   Candida parapsilosis NOT DETECTED NOT DETECTED Final   Candida tropicalis NOT DETECTED NOT DETECTED Final    Comment: Performed at [facility]  Urine culture     Status: Abnormal   Collection Time: 01/08/16  9:41 PM  Result Value Ref Range Status   Specimen Description URINE, CLEAN CATCH  Final   Special Requests NONE  Final   Culture (A)  Final    20,000 COLONIES/mL PROTEUS MIRABILIS SUSCEPTIBILITIES PERFORMED  ON PREVIOUS CULTURE WITHIN THE LAST 5 DAYS. Performed at [facility]    Report Status 01/11/2016 FINAL  Final  Culture, blood (routine x 2)     Status: None (Preliminary result)   Collection Time: 01/10/16 11:33 AM  Result Value Ref Range Status   Specimen Description BLOOD RIGHT ANTECUBITAL  Final   Special Requests BOTTLES DRAWN AEROBIC AND ANAEROBIC 5CC EACH  Final   Culture   Final    NO GROWTH 3 DAYS Performed at [facility]    Report Status PENDING  Incomplete  Culture, blood (routine x 2)     Status: None (Preliminary result)   Collection Time: 01/10/16 11:40 AM  Result Value Ref Range Status   Specimen Description BLOOD LEFT HAND  Final   Special Requests IN PEDIATRIC BOTTLE 4CC  Final   Culture   Final    NO GROWTH 3 DAYS Performed at [facility]    Report Status PENDING  Incomplete  MRSA PCR Screening     Status: None   Collection Time: 01/10/16 12:21 PM  Result Value Ref Range Status  MRSA by PCR NEGATIVE NEGATIVE Final    Comment:        The GeneXpert MRSA Assay (FDA approved for NASAL specimens only), is one component of a comprehensive MRSA colonization surveillance program. It is not intended to diagnose MRSA infection nor to guide or monitor treatment for MRSA infections.      Labs: Basic Metabolic Panel:  Recent Labs Lab 01/09/16 0350 01/10/16 0329 01/11/16 0609 01/12/16 0526 01/13/16 0519  NA 142 143 145 143 140  K 3.1* 3.4* 2.9* 2.9* 3.1*  CL 110 112* 108 106 104  CO2 '25 24 28 31 31  ' GLUCOSE 173* 199* 198* 246* 221*  BUN '18 12 9 8 7  ' CREATININE 1.05* 0.71 0.66 0.60 0.61  CALCIUM 7.9* 7.7* 7.6* 7.7* 7.8*  MG  --   --  1.1* 1.5* 1.8   Liver Function Tests:  Recent Labs Lab 01/06/16 2055 01/08/16 1711  AST 19 27  ALT 12* 13*  ALKPHOS 82 87  BILITOT 1.4* 1.1  PROT 5.9* 6.3*  ALBUMIN 2.8* 2.5*    Recent Labs Lab 01/06/16 2055 01/08/16 1711  LIPASE 21 28   No results for input(s): AMMONIA in the  last 168 hours. CBC:  Recent Labs Lab 01/06/16 2055 01/08/16 1711 01/09/16 0350 01/10/16 0329 01/11/16 0609  WBC 11.7* 11.7* 8.6 6.1 5.1  NEUTROABS 9.9*  --   --   --   --   HGB 13.5 12.7 11.4* 11.1* 11.0*  HCT 39.5 38.9 34.9* 33.4* 33.0*  MCV 87.2 89.6 88.8 87.4 86.4  PLT 207 211 206 189 205   Cardiac Enzymes: No results for input(s): CKTOTAL, CKMB, CKMBINDEX, TROPONINI in the last 168 hours. BNP: BNP (last 3 results) No results for input(s): BNP in the last 8760 hours.  ProBNP (last 3 results) No results for input(s): PROBNP in the last 8760 hours.  CBG:  Recent Labs Lab 01/12/16 0813 01/12/16 1228 01/12/16 1736 01/12/16 2229 01/13/16 0759  GLUCAP 203* 270* 281* 270* 203*

## 2016-01-13 NOTE — Care Management Important Message (Signed)
Important Message  Patient Details  Name: Marcia [name] MRN: [id number] Date of Birth: Feb 15, 1935   Medicare Important Message Given:  Yes    Camillo Flaming 01/13/2016, 10:19 AMImportant Message  Patient Details  Name: Marcia [name] MRN: [id number] Date of Birth: 1934/06/11   Medicare Important Message Given:  Yes    Camillo Flaming 01/13/2016, 10:19 AM

## 2016-01-13 NOTE — Discharge Instructions (Signed)
°  Amoxicillin/Clavulanate (Augmentin)  What is this medication used for?  This medication is an antibiotic used to treat infections.  How should this medication be given?   Shake well before measuring the dose  Measure the correct dose using an oral syringe  Place the syringe in the infant's mouth and give small amounts, allowing time for them to swallow after each squirt.   Give this medication on the schedule as provided by your doctor.  May be given with or without food.  Giving with food may decrease the chance of stomach upset.  What should be done if a dose is missed? If a dose is missed, give it as soon as you remember. If it is close to the time for the next dose, simply skip the missed dose and restart the regular dosing schedule. It is important NOT to give double the recommended dose.   Are there any side effects?  This medication may cause vomiting, diarrhea, or gas pains.  Other important information:  Store refrigerated unless otherwise instructed by your pharmacist.  Complete the entire course of this medication even if the infection seems to have improved.

## 2016-01-13 NOTE — Progress Notes (Signed)
Pt will be discharged home via transportation service arranged by family. Caregiver at bedside. Discharge instructions given to husband at bedside and all questions answered.

## 2016-01-13 NOTE — Progress Notes (Signed)
Pt's husband selected [facility] for [facility], pt has 24/7 care and will use [employer] for transportation home(husband called).

## 2016-01-14 ENCOUNTER — Telehealth: Payer: Self-pay | Admitting: Medical Oncology

## 2016-01-14 DIAGNOSIS — L89623 Pressure ulcer of left heel, stage 3: Secondary | ICD-10-CM | POA: Diagnosis not present

## 2016-01-14 DIAGNOSIS — F0391 Unspecified dementia with behavioral disturbance: Secondary | ICD-10-CM | POA: Diagnosis not present

## 2016-01-14 NOTE — Telephone Encounter (Signed)
Marcia [name] called re ct results and MD recommended to call GI and oncology. Note to [facility] regarding f/u

## 2016-01-14 NOTE — Telephone Encounter (Signed)
No concerning findings in the lungs. Suspicious rectal mass need to be evaluated by gastroenterology. Keep CT scan of the chest and follow-up visit with me as previously scheduled. Thank you

## 2016-01-15 DIAGNOSIS — Z85828 Personal history of other malignant neoplasm of skin: Secondary | ICD-10-CM | POA: Diagnosis not present

## 2016-01-15 DIAGNOSIS — M069 Rheumatoid arthritis, unspecified: Secondary | ICD-10-CM | POA: Diagnosis not present

## 2016-01-15 DIAGNOSIS — Z87891 Personal history of nicotine dependence: Secondary | ICD-10-CM | POA: Diagnosis not present

## 2016-01-15 DIAGNOSIS — S81001A Unspecified open wound, right knee, initial encounter: Secondary | ICD-10-CM | POA: Diagnosis not present

## 2016-01-15 DIAGNOSIS — I739 Peripheral vascular disease, unspecified: Secondary | ICD-10-CM | POA: Diagnosis not present

## 2016-01-15 DIAGNOSIS — S81801A Unspecified open wound, right lower leg, initial encounter: Secondary | ICD-10-CM | POA: Diagnosis not present

## 2016-01-15 DIAGNOSIS — L89623 Pressure ulcer of left heel, stage 3: Secondary | ICD-10-CM | POA: Diagnosis not present

## 2016-01-15 DIAGNOSIS — L97422 Non-pressure chronic ulcer of left heel and midfoot with fat layer exposed: Secondary | ICD-10-CM | POA: Diagnosis not present

## 2016-01-15 DIAGNOSIS — E1151 Type 2 diabetes mellitus with diabetic peripheral angiopathy without gangrene: Secondary | ICD-10-CM | POA: Diagnosis not present

## 2016-01-15 DIAGNOSIS — F0281 Dementia in other diseases classified elsewhere with behavioral disturbance: Secondary | ICD-10-CM | POA: Diagnosis not present

## 2016-01-15 DIAGNOSIS — Z9221 Personal history of antineoplastic chemotherapy: Secondary | ICD-10-CM | POA: Diagnosis not present

## 2016-01-15 DIAGNOSIS — Z85118 Personal history of other malignant neoplasm of bronchus and lung: Secondary | ICD-10-CM | POA: Diagnosis not present

## 2016-01-15 DIAGNOSIS — S61501A Unspecified open wound of right wrist, initial encounter: Secondary | ICD-10-CM | POA: Diagnosis not present

## 2016-01-15 DIAGNOSIS — E11621 Type 2 diabetes mellitus with foot ulcer: Secondary | ICD-10-CM | POA: Diagnosis not present

## 2016-01-15 DIAGNOSIS — X58XXXA Exposure to other specified factors, initial encounter: Secondary | ICD-10-CM | POA: Diagnosis not present

## 2016-01-15 LAB — CULTURE, BLOOD (ROUTINE X 2)
CULTURE: NO GROWTH
Culture: NO GROWTH

## 2016-01-15 NOTE — Telephone Encounter (Signed)
Wynetta Emery notified.

## 2016-01-16 DIAGNOSIS — S72142D Displaced intertrochanteric fracture of left femur, subsequent encounter for closed fracture with routine healing: Secondary | ICD-10-CM | POA: Diagnosis not present

## 2016-01-17 DIAGNOSIS — L89623 Pressure ulcer of left heel, stage 3: Secondary | ICD-10-CM | POA: Diagnosis not present

## 2016-01-17 DIAGNOSIS — F0391 Unspecified dementia with behavioral disturbance: Secondary | ICD-10-CM | POA: Diagnosis not present

## 2016-01-20 DIAGNOSIS — F0391 Unspecified dementia with behavioral disturbance: Secondary | ICD-10-CM | POA: Diagnosis not present

## 2016-01-20 DIAGNOSIS — L89623 Pressure ulcer of left heel, stage 3: Secondary | ICD-10-CM | POA: Diagnosis not present

## 2016-01-21 ENCOUNTER — Encounter (HOSPITAL_BASED_OUTPATIENT_CLINIC_OR_DEPARTMENT_OTHER): Payer: Medicare Other | Attending: Surgery

## 2016-01-21 DIAGNOSIS — Z87891 Personal history of nicotine dependence: Secondary | ICD-10-CM | POA: Diagnosis not present

## 2016-01-21 DIAGNOSIS — Z85118 Personal history of other malignant neoplasm of bronchus and lung: Secondary | ICD-10-CM | POA: Diagnosis not present

## 2016-01-21 DIAGNOSIS — I739 Peripheral vascular disease, unspecified: Secondary | ICD-10-CM | POA: Diagnosis not present

## 2016-01-21 DIAGNOSIS — F0281 Dementia in other diseases classified elsewhere with behavioral disturbance: Secondary | ICD-10-CM | POA: Diagnosis not present

## 2016-01-21 DIAGNOSIS — E1140 Type 2 diabetes mellitus with diabetic neuropathy, unspecified: Secondary | ICD-10-CM | POA: Insufficient documentation

## 2016-01-21 DIAGNOSIS — M069 Rheumatoid arthritis, unspecified: Secondary | ICD-10-CM | POA: Insufficient documentation

## 2016-01-21 DIAGNOSIS — E1151 Type 2 diabetes mellitus with diabetic peripheral angiopathy without gangrene: Secondary | ICD-10-CM | POA: Insufficient documentation

## 2016-01-21 DIAGNOSIS — L89152 Pressure ulcer of sacral region, stage 2: Secondary | ICD-10-CM | POA: Insufficient documentation

## 2016-01-21 DIAGNOSIS — L97429 Non-pressure chronic ulcer of left heel and midfoot with unspecified severity: Secondary | ICD-10-CM | POA: Diagnosis not present

## 2016-01-21 DIAGNOSIS — L97422 Non-pressure chronic ulcer of left heel and midfoot with fat layer exposed: Secondary | ICD-10-CM | POA: Insufficient documentation

## 2016-01-21 DIAGNOSIS — E11621 Type 2 diabetes mellitus with foot ulcer: Secondary | ICD-10-CM | POA: Insufficient documentation

## 2016-01-21 DIAGNOSIS — S81801A Unspecified open wound, right lower leg, initial encounter: Secondary | ICD-10-CM | POA: Diagnosis not present

## 2016-01-21 DIAGNOSIS — Z85828 Personal history of other malignant neoplasm of skin: Secondary | ICD-10-CM | POA: Diagnosis not present

## 2016-01-21 DIAGNOSIS — L89623 Pressure ulcer of left heel, stage 3: Secondary | ICD-10-CM | POA: Diagnosis not present

## 2016-01-24 DIAGNOSIS — E785 Hyperlipidemia, unspecified: Secondary | ICD-10-CM | POA: Diagnosis not present

## 2016-01-24 DIAGNOSIS — N390 Urinary tract infection, site not specified: Secondary | ICD-10-CM | POA: Diagnosis not present

## 2016-01-24 DIAGNOSIS — N201 Calculus of ureter: Secondary | ICD-10-CM | POA: Diagnosis not present

## 2016-01-24 DIAGNOSIS — E039 Hypothyroidism, unspecified: Secondary | ICD-10-CM | POA: Diagnosis not present

## 2016-01-24 DIAGNOSIS — F0280 Dementia in other diseases classified elsewhere without behavioral disturbance: Secondary | ICD-10-CM | POA: Diagnosis not present

## 2016-01-24 DIAGNOSIS — N136 Pyonephrosis: Secondary | ICD-10-CM | POA: Diagnosis not present

## 2016-01-24 DIAGNOSIS — G309 Alzheimer's disease, unspecified: Secondary | ICD-10-CM | POA: Diagnosis not present

## 2016-01-24 DIAGNOSIS — E559 Vitamin D deficiency, unspecified: Secondary | ICD-10-CM | POA: Diagnosis not present

## 2016-01-24 DIAGNOSIS — N183 Chronic kidney disease, stage 3 (moderate): Secondary | ICD-10-CM | POA: Diagnosis not present

## 2016-01-24 DIAGNOSIS — E1159 Type 2 diabetes mellitus with other circulatory complications: Secondary | ICD-10-CM | POA: Diagnosis not present

## 2016-01-24 DIAGNOSIS — E46 Unspecified protein-calorie malnutrition: Secondary | ICD-10-CM | POA: Diagnosis not present

## 2016-01-24 DIAGNOSIS — C3490 Malignant neoplasm of unspecified part of unspecified bronchus or lung: Secondary | ICD-10-CM | POA: Diagnosis not present

## 2016-01-24 DIAGNOSIS — M059 Rheumatoid arthritis with rheumatoid factor, unspecified: Secondary | ICD-10-CM | POA: Diagnosis not present

## 2016-01-24 DIAGNOSIS — L8990 Pressure ulcer of unspecified site, unspecified stage: Secondary | ICD-10-CM | POA: Diagnosis not present

## 2016-01-24 DIAGNOSIS — I10 Essential (primary) hypertension: Secondary | ICD-10-CM | POA: Diagnosis not present

## 2016-01-24 DIAGNOSIS — N200 Calculus of kidney: Secondary | ICD-10-CM | POA: Diagnosis not present

## 2016-01-24 DIAGNOSIS — R339 Retention of urine, unspecified: Secondary | ICD-10-CM | POA: Diagnosis not present

## 2016-01-27 DIAGNOSIS — G309 Alzheimer's disease, unspecified: Secondary | ICD-10-CM | POA: Diagnosis not present

## 2016-01-27 DIAGNOSIS — N390 Urinary tract infection, site not specified: Secondary | ICD-10-CM | POA: Diagnosis not present

## 2016-01-27 DIAGNOSIS — R339 Retention of urine, unspecified: Secondary | ICD-10-CM | POA: Diagnosis not present

## 2016-01-27 DIAGNOSIS — E46 Unspecified protein-calorie malnutrition: Secondary | ICD-10-CM | POA: Diagnosis not present

## 2016-01-27 DIAGNOSIS — N200 Calculus of kidney: Secondary | ICD-10-CM | POA: Diagnosis not present

## 2016-01-27 DIAGNOSIS — F0280 Dementia in other diseases classified elsewhere without behavioral disturbance: Secondary | ICD-10-CM | POA: Diagnosis not present

## 2016-01-28 DIAGNOSIS — Z87891 Personal history of nicotine dependence: Secondary | ICD-10-CM | POA: Diagnosis not present

## 2016-01-28 DIAGNOSIS — Z85118 Personal history of other malignant neoplasm of bronchus and lung: Secondary | ICD-10-CM | POA: Diagnosis not present

## 2016-01-28 DIAGNOSIS — L89152 Pressure ulcer of sacral region, stage 2: Secondary | ICD-10-CM | POA: Diagnosis not present

## 2016-01-28 DIAGNOSIS — S81801D Unspecified open wound, right lower leg, subsequent encounter: Secondary | ICD-10-CM | POA: Diagnosis not present

## 2016-01-28 DIAGNOSIS — E1140 Type 2 diabetes mellitus with diabetic neuropathy, unspecified: Secondary | ICD-10-CM | POA: Diagnosis not present

## 2016-01-28 DIAGNOSIS — E11621 Type 2 diabetes mellitus with foot ulcer: Secondary | ICD-10-CM | POA: Diagnosis not present

## 2016-01-28 DIAGNOSIS — E1151 Type 2 diabetes mellitus with diabetic peripheral angiopathy without gangrene: Secondary | ICD-10-CM | POA: Diagnosis not present

## 2016-01-28 DIAGNOSIS — L89623 Pressure ulcer of left heel, stage 3: Secondary | ICD-10-CM | POA: Diagnosis not present

## 2016-01-28 DIAGNOSIS — Z85828 Personal history of other malignant neoplasm of skin: Secondary | ICD-10-CM | POA: Diagnosis not present

## 2016-01-28 DIAGNOSIS — F0281 Dementia in other diseases classified elsewhere with behavioral disturbance: Secondary | ICD-10-CM | POA: Diagnosis not present

## 2016-01-28 DIAGNOSIS — L97422 Non-pressure chronic ulcer of left heel and midfoot with fat layer exposed: Secondary | ICD-10-CM | POA: Diagnosis not present

## 2016-01-28 DIAGNOSIS — M069 Rheumatoid arthritis, unspecified: Secondary | ICD-10-CM | POA: Diagnosis not present

## 2016-01-30 DIAGNOSIS — E46 Unspecified protein-calorie malnutrition: Secondary | ICD-10-CM | POA: Diagnosis not present

## 2016-01-30 DIAGNOSIS — N200 Calculus of kidney: Secondary | ICD-10-CM | POA: Diagnosis not present

## 2016-01-30 DIAGNOSIS — F0280 Dementia in other diseases classified elsewhere without behavioral disturbance: Secondary | ICD-10-CM | POA: Diagnosis not present

## 2016-01-30 DIAGNOSIS — R339 Retention of urine, unspecified: Secondary | ICD-10-CM | POA: Diagnosis not present

## 2016-01-30 DIAGNOSIS — N390 Urinary tract infection, site not specified: Secondary | ICD-10-CM | POA: Diagnosis not present

## 2016-01-30 DIAGNOSIS — G309 Alzheimer's disease, unspecified: Secondary | ICD-10-CM | POA: Diagnosis not present

## 2016-02-02 DIAGNOSIS — F0280 Dementia in other diseases classified elsewhere without behavioral disturbance: Secondary | ICD-10-CM | POA: Diagnosis not present

## 2016-02-02 DIAGNOSIS — N390 Urinary tract infection, site not specified: Secondary | ICD-10-CM | POA: Diagnosis not present

## 2016-02-02 DIAGNOSIS — N200 Calculus of kidney: Secondary | ICD-10-CM | POA: Diagnosis not present

## 2016-02-02 DIAGNOSIS — E46 Unspecified protein-calorie malnutrition: Secondary | ICD-10-CM | POA: Diagnosis not present

## 2016-02-02 DIAGNOSIS — G309 Alzheimer's disease, unspecified: Secondary | ICD-10-CM | POA: Diagnosis not present

## 2016-02-02 DIAGNOSIS — R339 Retention of urine, unspecified: Secondary | ICD-10-CM | POA: Diagnosis not present

## 2016-02-03 DIAGNOSIS — N390 Urinary tract infection, site not specified: Secondary | ICD-10-CM | POA: Diagnosis not present

## 2016-02-03 DIAGNOSIS — G309 Alzheimer's disease, unspecified: Secondary | ICD-10-CM | POA: Diagnosis not present

## 2016-02-03 DIAGNOSIS — E46 Unspecified protein-calorie malnutrition: Secondary | ICD-10-CM | POA: Diagnosis not present

## 2016-02-03 DIAGNOSIS — R339 Retention of urine, unspecified: Secondary | ICD-10-CM | POA: Diagnosis not present

## 2016-02-03 DIAGNOSIS — N200 Calculus of kidney: Secondary | ICD-10-CM | POA: Diagnosis not present

## 2016-02-03 DIAGNOSIS — F0280 Dementia in other diseases classified elsewhere without behavioral disturbance: Secondary | ICD-10-CM | POA: Diagnosis not present

## 2016-02-06 DIAGNOSIS — E46 Unspecified protein-calorie malnutrition: Secondary | ICD-10-CM | POA: Diagnosis not present

## 2016-02-06 DIAGNOSIS — R339 Retention of urine, unspecified: Secondary | ICD-10-CM | POA: Diagnosis not present

## 2016-02-06 DIAGNOSIS — N200 Calculus of kidney: Secondary | ICD-10-CM | POA: Diagnosis not present

## 2016-02-06 DIAGNOSIS — G309 Alzheimer's disease, unspecified: Secondary | ICD-10-CM | POA: Diagnosis not present

## 2016-02-06 DIAGNOSIS — N390 Urinary tract infection, site not specified: Secondary | ICD-10-CM | POA: Diagnosis not present

## 2016-02-06 DIAGNOSIS — F0280 Dementia in other diseases classified elsewhere without behavioral disturbance: Secondary | ICD-10-CM | POA: Diagnosis not present

## 2016-02-07 DIAGNOSIS — G309 Alzheimer's disease, unspecified: Secondary | ICD-10-CM | POA: Diagnosis not present

## 2016-02-07 DIAGNOSIS — N200 Calculus of kidney: Secondary | ICD-10-CM | POA: Diagnosis not present

## 2016-02-07 DIAGNOSIS — N390 Urinary tract infection, site not specified: Secondary | ICD-10-CM | POA: Diagnosis not present

## 2016-02-07 DIAGNOSIS — F0280 Dementia in other diseases classified elsewhere without behavioral disturbance: Secondary | ICD-10-CM | POA: Diagnosis not present

## 2016-02-07 DIAGNOSIS — E46 Unspecified protein-calorie malnutrition: Secondary | ICD-10-CM | POA: Diagnosis not present

## 2016-02-07 DIAGNOSIS — R339 Retention of urine, unspecified: Secondary | ICD-10-CM | POA: Diagnosis not present

## 2016-02-08 DIAGNOSIS — G309 Alzheimer's disease, unspecified: Secondary | ICD-10-CM | POA: Diagnosis not present

## 2016-02-08 DIAGNOSIS — N390 Urinary tract infection, site not specified: Secondary | ICD-10-CM | POA: Diagnosis not present

## 2016-02-08 DIAGNOSIS — R339 Retention of urine, unspecified: Secondary | ICD-10-CM | POA: Diagnosis not present

## 2016-02-08 DIAGNOSIS — N200 Calculus of kidney: Secondary | ICD-10-CM | POA: Diagnosis not present

## 2016-02-08 DIAGNOSIS — F0280 Dementia in other diseases classified elsewhere without behavioral disturbance: Secondary | ICD-10-CM | POA: Diagnosis not present

## 2016-02-08 DIAGNOSIS — E46 Unspecified protein-calorie malnutrition: Secondary | ICD-10-CM | POA: Diagnosis not present

## 2016-02-11 DIAGNOSIS — R339 Retention of urine, unspecified: Secondary | ICD-10-CM | POA: Diagnosis not present

## 2016-02-11 DIAGNOSIS — N390 Urinary tract infection, site not specified: Secondary | ICD-10-CM | POA: Diagnosis not present

## 2016-02-11 DIAGNOSIS — F0280 Dementia in other diseases classified elsewhere without behavioral disturbance: Secondary | ICD-10-CM | POA: Diagnosis not present

## 2016-02-11 DIAGNOSIS — G309 Alzheimer's disease, unspecified: Secondary | ICD-10-CM | POA: Diagnosis not present

## 2016-02-11 DIAGNOSIS — E46 Unspecified protein-calorie malnutrition: Secondary | ICD-10-CM | POA: Diagnosis not present

## 2016-02-11 DIAGNOSIS — N200 Calculus of kidney: Secondary | ICD-10-CM | POA: Diagnosis not present

## 2016-02-13 DIAGNOSIS — N390 Urinary tract infection, site not specified: Secondary | ICD-10-CM | POA: Diagnosis not present

## 2016-02-13 DIAGNOSIS — E46 Unspecified protein-calorie malnutrition: Secondary | ICD-10-CM | POA: Diagnosis not present

## 2016-02-13 DIAGNOSIS — N200 Calculus of kidney: Secondary | ICD-10-CM | POA: Diagnosis not present

## 2016-02-13 DIAGNOSIS — G309 Alzheimer's disease, unspecified: Secondary | ICD-10-CM | POA: Diagnosis not present

## 2016-02-13 DIAGNOSIS — F0280 Dementia in other diseases classified elsewhere without behavioral disturbance: Secondary | ICD-10-CM | POA: Diagnosis not present

## 2016-02-13 DIAGNOSIS — R339 Retention of urine, unspecified: Secondary | ICD-10-CM | POA: Diagnosis not present

## 2016-02-17 DIAGNOSIS — L8990 Pressure ulcer of unspecified site, unspecified stage: Secondary | ICD-10-CM | POA: Diagnosis not present

## 2016-02-17 DIAGNOSIS — I10 Essential (primary) hypertension: Secondary | ICD-10-CM | POA: Diagnosis not present

## 2016-02-17 DIAGNOSIS — R339 Retention of urine, unspecified: Secondary | ICD-10-CM | POA: Diagnosis not present

## 2016-02-17 DIAGNOSIS — N183 Chronic kidney disease, stage 3 (moderate): Secondary | ICD-10-CM | POA: Diagnosis not present

## 2016-02-17 DIAGNOSIS — E559 Vitamin D deficiency, unspecified: Secondary | ICD-10-CM | POA: Diagnosis not present

## 2016-02-17 DIAGNOSIS — E039 Hypothyroidism, unspecified: Secondary | ICD-10-CM | POA: Diagnosis not present

## 2016-02-17 DIAGNOSIS — C3490 Malignant neoplasm of unspecified part of unspecified bronchus or lung: Secondary | ICD-10-CM | POA: Diagnosis not present

## 2016-02-17 DIAGNOSIS — E46 Unspecified protein-calorie malnutrition: Secondary | ICD-10-CM | POA: Diagnosis not present

## 2016-02-17 DIAGNOSIS — E785 Hyperlipidemia, unspecified: Secondary | ICD-10-CM | POA: Diagnosis not present

## 2016-02-17 DIAGNOSIS — N200 Calculus of kidney: Secondary | ICD-10-CM | POA: Diagnosis not present

## 2016-02-17 DIAGNOSIS — E1159 Type 2 diabetes mellitus with other circulatory complications: Secondary | ICD-10-CM | POA: Diagnosis not present

## 2016-02-17 DIAGNOSIS — F0280 Dementia in other diseases classified elsewhere without behavioral disturbance: Secondary | ICD-10-CM | POA: Diagnosis not present

## 2016-02-17 DIAGNOSIS — N390 Urinary tract infection, site not specified: Secondary | ICD-10-CM | POA: Diagnosis not present

## 2016-02-17 DIAGNOSIS — M059 Rheumatoid arthritis with rheumatoid factor, unspecified: Secondary | ICD-10-CM | POA: Diagnosis not present

## 2016-02-17 DIAGNOSIS — G309 Alzheimer's disease, unspecified: Secondary | ICD-10-CM | POA: Diagnosis not present

## 2016-02-18 ENCOUNTER — Encounter (HOSPITAL_BASED_OUTPATIENT_CLINIC_OR_DEPARTMENT_OTHER): Payer: Medicare Other | Attending: Surgery

## 2016-02-20 DIAGNOSIS — G309 Alzheimer's disease, unspecified: Secondary | ICD-10-CM | POA: Diagnosis not present

## 2016-02-20 DIAGNOSIS — E46 Unspecified protein-calorie malnutrition: Secondary | ICD-10-CM | POA: Diagnosis not present

## 2016-02-20 DIAGNOSIS — F0280 Dementia in other diseases classified elsewhere without behavioral disturbance: Secondary | ICD-10-CM | POA: Diagnosis not present

## 2016-02-20 DIAGNOSIS — R339 Retention of urine, unspecified: Secondary | ICD-10-CM | POA: Diagnosis not present

## 2016-02-20 DIAGNOSIS — N200 Calculus of kidney: Secondary | ICD-10-CM | POA: Diagnosis not present

## 2016-02-20 DIAGNOSIS — N390 Urinary tract infection, site not specified: Secondary | ICD-10-CM | POA: Diagnosis not present

## 2016-02-24 ENCOUNTER — Ambulatory Visit (INDEPENDENT_AMBULATORY_CARE_PROVIDER_SITE_OTHER): Payer: PRIVATE HEALTH INSURANCE | Admitting: Podiatry

## 2016-02-24 DIAGNOSIS — M79609 Pain in unspecified limb: Secondary | ICD-10-CM | POA: Diagnosis not present

## 2016-02-24 DIAGNOSIS — L603 Nail dystrophy: Secondary | ICD-10-CM

## 2016-02-24 DIAGNOSIS — E0843 Diabetes mellitus due to underlying condition with diabetic autonomic (poly)neuropathy: Secondary | ICD-10-CM

## 2016-02-24 DIAGNOSIS — B351 Tinea unguium: Secondary | ICD-10-CM

## 2016-02-24 DIAGNOSIS — L608 Other nail disorders: Secondary | ICD-10-CM

## 2016-02-24 NOTE — Progress Notes (Signed)
SUBJECTIVE Patient with a history of diabetes mellitus presents to office today complaining of elongated, thickened nails. Pain while ambulating in shoes. Patient is unable to trim their own nails.   Allergies  Allergen Reactions  . Amoxicillin Nausea And Vomiting  . Cefaclor Nausea And Vomiting  . Hydrocodone Nausea And Vomiting  . Sulfa Antibiotics     " legs give out"    OBJECTIVE General Patient is awake, alert, and oriented x 3 and in no acute distress. Derm Skin is dry and supple bilateral. Negative open lesions or macerations. Remaining integument unremarkable. Nails are tender, long, thickened and dystrophic with subungual debris, consistent with onychomycosis, 1-5 bilateral. No signs of infection noted. Vasc  DP and PT pedal pulses palpable bilaterally. Temperature gradient within normal limits.  Neuro Epicritic and protective threshold sensation diminished bilaterally.  Musculoskeletal Exam No symptomatic pedal deformities noted bilateral. Muscular strength within normal limits.  ASSESSMENT 1. Diabetes Mellitus w/ peripheral neuropathy 2. Onychomycosis of nail due to dermatophyte bilateral 3. Pain in foot bilateral  PLAN OF CARE 1. Patient evaluated today. 2. Instructed to maintain good pedal hygiene and foot care. Stressed importance of controlling blood sugar.  3. Mechanical debridement of nails 1-5 bilaterally performed using a nail nipper. Filed with dremel without incident.  4. Return to clinic in 3 mos.     Edrick Kins, DPM [facility]  Dr. Edrick Kins, [address]                                        [place],  [address]                Office [phone]  Fax [phone]

## 2016-02-25 ENCOUNTER — Telehealth: Payer: Self-pay | Admitting: Adult Health

## 2016-02-25 DIAGNOSIS — R339 Retention of urine, unspecified: Secondary | ICD-10-CM | POA: Diagnosis not present

## 2016-02-25 DIAGNOSIS — N200 Calculus of kidney: Secondary | ICD-10-CM | POA: Diagnosis not present

## 2016-02-25 DIAGNOSIS — E46 Unspecified protein-calorie malnutrition: Secondary | ICD-10-CM | POA: Diagnosis not present

## 2016-02-25 DIAGNOSIS — G309 Alzheimer's disease, unspecified: Secondary | ICD-10-CM | POA: Diagnosis not present

## 2016-02-25 DIAGNOSIS — F0280 Dementia in other diseases classified elsewhere without behavioral disturbance: Secondary | ICD-10-CM | POA: Diagnosis not present

## 2016-02-25 DIAGNOSIS — N390 Urinary tract infection, site not specified: Secondary | ICD-10-CM | POA: Diagnosis not present

## 2016-02-25 NOTE — Telephone Encounter (Signed)
Sandy please call the spouse. If hospice is managing her medication (aricept, namenda and lorazepam) and the spouse is comfortable with her not coming then its fine to cancel. Please get an update on the patient's status.

## 2016-02-25 NOTE — Telephone Encounter (Signed)
SPOUSE REQ RET CALL FROM [facility] TO CONF IF PT SHOULD KEEP APPT TOMORROW 1/10. PT IS NOW IN CARE OF HOSPICE.

## 2016-02-26 ENCOUNTER — Ambulatory Visit: Payer: Medicare Other | Admitting: Adult Health

## 2016-02-26 NOTE — Telephone Encounter (Signed)
I spoke to spouse.  Pt is taking mainly the lorazepam po bid, will sometimes take the aricept and namenda depending on her wanting to take it.  Hospice does not really manage meds per spouse.  Pt is sleeping 20hours daily and has some anxiety (thats why he gives lorazepam).   Hard to get here for appt.   Will cancel appt for today.

## 2016-02-26 NOTE — Telephone Encounter (Signed)
Noted  

## 2016-02-28 DIAGNOSIS — R339 Retention of urine, unspecified: Secondary | ICD-10-CM | POA: Diagnosis not present

## 2016-02-28 DIAGNOSIS — G309 Alzheimer's disease, unspecified: Secondary | ICD-10-CM | POA: Diagnosis not present

## 2016-02-28 DIAGNOSIS — N200 Calculus of kidney: Secondary | ICD-10-CM | POA: Diagnosis not present

## 2016-02-28 DIAGNOSIS — N390 Urinary tract infection, site not specified: Secondary | ICD-10-CM | POA: Diagnosis not present

## 2016-02-28 DIAGNOSIS — E46 Unspecified protein-calorie malnutrition: Secondary | ICD-10-CM | POA: Diagnosis not present

## 2016-02-28 DIAGNOSIS — F0280 Dementia in other diseases classified elsewhere without behavioral disturbance: Secondary | ICD-10-CM | POA: Diagnosis not present

## 2016-03-01 DIAGNOSIS — N390 Urinary tract infection, site not specified: Secondary | ICD-10-CM | POA: Diagnosis not present

## 2016-03-01 DIAGNOSIS — R339 Retention of urine, unspecified: Secondary | ICD-10-CM | POA: Diagnosis not present

## 2016-03-01 DIAGNOSIS — F0280 Dementia in other diseases classified elsewhere without behavioral disturbance: Secondary | ICD-10-CM | POA: Diagnosis not present

## 2016-03-01 DIAGNOSIS — E46 Unspecified protein-calorie malnutrition: Secondary | ICD-10-CM | POA: Diagnosis not present

## 2016-03-01 DIAGNOSIS — G309 Alzheimer's disease, unspecified: Secondary | ICD-10-CM | POA: Diagnosis not present

## 2016-03-01 DIAGNOSIS — N200 Calculus of kidney: Secondary | ICD-10-CM | POA: Diagnosis not present

## 2016-03-02 DIAGNOSIS — N390 Urinary tract infection, site not specified: Secondary | ICD-10-CM | POA: Diagnosis not present

## 2016-03-02 DIAGNOSIS — G309 Alzheimer's disease, unspecified: Secondary | ICD-10-CM | POA: Diagnosis not present

## 2016-03-02 DIAGNOSIS — N200 Calculus of kidney: Secondary | ICD-10-CM | POA: Diagnosis not present

## 2016-03-02 DIAGNOSIS — R339 Retention of urine, unspecified: Secondary | ICD-10-CM | POA: Diagnosis not present

## 2016-03-02 DIAGNOSIS — F0280 Dementia in other diseases classified elsewhere without behavioral disturbance: Secondary | ICD-10-CM | POA: Diagnosis not present

## 2016-03-02 DIAGNOSIS — E46 Unspecified protein-calorie malnutrition: Secondary | ICD-10-CM | POA: Diagnosis not present

## 2016-03-09 DIAGNOSIS — F0280 Dementia in other diseases classified elsewhere without behavioral disturbance: Secondary | ICD-10-CM | POA: Diagnosis not present

## 2016-03-09 DIAGNOSIS — G309 Alzheimer's disease, unspecified: Secondary | ICD-10-CM | POA: Diagnosis not present

## 2016-03-09 DIAGNOSIS — E46 Unspecified protein-calorie malnutrition: Secondary | ICD-10-CM | POA: Diagnosis not present

## 2016-03-09 DIAGNOSIS — N200 Calculus of kidney: Secondary | ICD-10-CM | POA: Diagnosis not present

## 2016-03-09 DIAGNOSIS — R339 Retention of urine, unspecified: Secondary | ICD-10-CM | POA: Diagnosis not present

## 2016-03-09 DIAGNOSIS — N390 Urinary tract infection, site not specified: Secondary | ICD-10-CM | POA: Diagnosis not present

## 2016-03-10 DIAGNOSIS — E46 Unspecified protein-calorie malnutrition: Secondary | ICD-10-CM | POA: Diagnosis not present

## 2016-03-10 DIAGNOSIS — F0280 Dementia in other diseases classified elsewhere without behavioral disturbance: Secondary | ICD-10-CM | POA: Diagnosis not present

## 2016-03-10 DIAGNOSIS — N200 Calculus of kidney: Secondary | ICD-10-CM | POA: Diagnosis not present

## 2016-03-10 DIAGNOSIS — G309 Alzheimer's disease, unspecified: Secondary | ICD-10-CM | POA: Diagnosis not present

## 2016-03-10 DIAGNOSIS — R339 Retention of urine, unspecified: Secondary | ICD-10-CM | POA: Diagnosis not present

## 2016-03-10 DIAGNOSIS — N390 Urinary tract infection, site not specified: Secondary | ICD-10-CM | POA: Diagnosis not present

## 2016-03-11 DIAGNOSIS — F0280 Dementia in other diseases classified elsewhere without behavioral disturbance: Secondary | ICD-10-CM | POA: Diagnosis not present

## 2016-03-11 DIAGNOSIS — E46 Unspecified protein-calorie malnutrition: Secondary | ICD-10-CM | POA: Diagnosis not present

## 2016-03-11 DIAGNOSIS — R339 Retention of urine, unspecified: Secondary | ICD-10-CM | POA: Diagnosis not present

## 2016-03-11 DIAGNOSIS — N390 Urinary tract infection, site not specified: Secondary | ICD-10-CM | POA: Diagnosis not present

## 2016-03-11 DIAGNOSIS — N200 Calculus of kidney: Secondary | ICD-10-CM | POA: Diagnosis not present

## 2016-03-11 DIAGNOSIS — G309 Alzheimer's disease, unspecified: Secondary | ICD-10-CM | POA: Diagnosis not present

## 2016-03-12 DIAGNOSIS — N390 Urinary tract infection, site not specified: Secondary | ICD-10-CM | POA: Diagnosis not present

## 2016-03-12 DIAGNOSIS — G309 Alzheimer's disease, unspecified: Secondary | ICD-10-CM | POA: Diagnosis not present

## 2016-03-12 DIAGNOSIS — F0280 Dementia in other diseases classified elsewhere without behavioral disturbance: Secondary | ICD-10-CM | POA: Diagnosis not present

## 2016-03-12 DIAGNOSIS — R339 Retention of urine, unspecified: Secondary | ICD-10-CM | POA: Diagnosis not present

## 2016-03-12 DIAGNOSIS — N200 Calculus of kidney: Secondary | ICD-10-CM | POA: Diagnosis not present

## 2016-03-12 DIAGNOSIS — E46 Unspecified protein-calorie malnutrition: Secondary | ICD-10-CM | POA: Diagnosis not present

## 2016-03-17 DIAGNOSIS — R339 Retention of urine, unspecified: Secondary | ICD-10-CM | POA: Diagnosis not present

## 2016-03-17 DIAGNOSIS — N200 Calculus of kidney: Secondary | ICD-10-CM | POA: Diagnosis not present

## 2016-03-17 DIAGNOSIS — F0280 Dementia in other diseases classified elsewhere without behavioral disturbance: Secondary | ICD-10-CM | POA: Diagnosis not present

## 2016-03-17 DIAGNOSIS — G309 Alzheimer's disease, unspecified: Secondary | ICD-10-CM | POA: Diagnosis not present

## 2016-03-17 DIAGNOSIS — E46 Unspecified protein-calorie malnutrition: Secondary | ICD-10-CM | POA: Diagnosis not present

## 2016-03-17 DIAGNOSIS — N390 Urinary tract infection, site not specified: Secondary | ICD-10-CM | POA: Diagnosis not present

## 2016-03-19 DIAGNOSIS — E1159 Type 2 diabetes mellitus with other circulatory complications: Secondary | ICD-10-CM | POA: Diagnosis not present

## 2016-03-19 DIAGNOSIS — C3490 Malignant neoplasm of unspecified part of unspecified bronchus or lung: Secondary | ICD-10-CM | POA: Diagnosis not present

## 2016-03-19 DIAGNOSIS — E46 Unspecified protein-calorie malnutrition: Secondary | ICD-10-CM | POA: Diagnosis not present

## 2016-03-19 DIAGNOSIS — L8990 Pressure ulcer of unspecified site, unspecified stage: Secondary | ICD-10-CM | POA: Diagnosis not present

## 2016-03-19 DIAGNOSIS — G309 Alzheimer's disease, unspecified: Secondary | ICD-10-CM | POA: Diagnosis not present

## 2016-03-19 DIAGNOSIS — N200 Calculus of kidney: Secondary | ICD-10-CM | POA: Diagnosis not present

## 2016-03-19 DIAGNOSIS — F0280 Dementia in other diseases classified elsewhere without behavioral disturbance: Secondary | ICD-10-CM | POA: Diagnosis not present

## 2016-03-19 DIAGNOSIS — M059 Rheumatoid arthritis with rheumatoid factor, unspecified: Secondary | ICD-10-CM | POA: Diagnosis not present

## 2016-03-19 DIAGNOSIS — N390 Urinary tract infection, site not specified: Secondary | ICD-10-CM | POA: Diagnosis not present

## 2016-03-19 DIAGNOSIS — I10 Essential (primary) hypertension: Secondary | ICD-10-CM | POA: Diagnosis not present

## 2016-03-19 DIAGNOSIS — E039 Hypothyroidism, unspecified: Secondary | ICD-10-CM | POA: Diagnosis not present

## 2016-03-19 DIAGNOSIS — E559 Vitamin D deficiency, unspecified: Secondary | ICD-10-CM | POA: Diagnosis not present

## 2016-03-19 DIAGNOSIS — R339 Retention of urine, unspecified: Secondary | ICD-10-CM | POA: Diagnosis not present

## 2016-03-19 DIAGNOSIS — N183 Chronic kidney disease, stage 3 (moderate): Secondary | ICD-10-CM | POA: Diagnosis not present

## 2016-03-19 DIAGNOSIS — E785 Hyperlipidemia, unspecified: Secondary | ICD-10-CM | POA: Diagnosis not present

## 2016-03-23 DIAGNOSIS — N390 Urinary tract infection, site not specified: Secondary | ICD-10-CM | POA: Diagnosis not present

## 2016-03-23 DIAGNOSIS — N200 Calculus of kidney: Secondary | ICD-10-CM | POA: Diagnosis not present

## 2016-03-23 DIAGNOSIS — E46 Unspecified protein-calorie malnutrition: Secondary | ICD-10-CM | POA: Diagnosis not present

## 2016-03-23 DIAGNOSIS — G309 Alzheimer's disease, unspecified: Secondary | ICD-10-CM | POA: Diagnosis not present

## 2016-03-23 DIAGNOSIS — F0280 Dementia in other diseases classified elsewhere without behavioral disturbance: Secondary | ICD-10-CM | POA: Diagnosis not present

## 2016-03-23 DIAGNOSIS — R339 Retention of urine, unspecified: Secondary | ICD-10-CM | POA: Diagnosis not present

## 2016-03-26 DIAGNOSIS — G309 Alzheimer's disease, unspecified: Secondary | ICD-10-CM | POA: Diagnosis not present

## 2016-03-26 DIAGNOSIS — N390 Urinary tract infection, site not specified: Secondary | ICD-10-CM | POA: Diagnosis not present

## 2016-03-26 DIAGNOSIS — R339 Retention of urine, unspecified: Secondary | ICD-10-CM | POA: Diagnosis not present

## 2016-03-26 DIAGNOSIS — N200 Calculus of kidney: Secondary | ICD-10-CM | POA: Diagnosis not present

## 2016-03-26 DIAGNOSIS — F0280 Dementia in other diseases classified elsewhere without behavioral disturbance: Secondary | ICD-10-CM | POA: Diagnosis not present

## 2016-03-26 DIAGNOSIS — E46 Unspecified protein-calorie malnutrition: Secondary | ICD-10-CM | POA: Diagnosis not present

## 2016-03-31 DIAGNOSIS — N390 Urinary tract infection, site not specified: Secondary | ICD-10-CM | POA: Diagnosis not present

## 2016-03-31 DIAGNOSIS — E46 Unspecified protein-calorie malnutrition: Secondary | ICD-10-CM | POA: Diagnosis not present

## 2016-03-31 DIAGNOSIS — N200 Calculus of kidney: Secondary | ICD-10-CM | POA: Diagnosis not present

## 2016-03-31 DIAGNOSIS — G309 Alzheimer's disease, unspecified: Secondary | ICD-10-CM | POA: Diagnosis not present

## 2016-03-31 DIAGNOSIS — F0280 Dementia in other diseases classified elsewhere without behavioral disturbance: Secondary | ICD-10-CM | POA: Diagnosis not present

## 2016-03-31 DIAGNOSIS — R339 Retention of urine, unspecified: Secondary | ICD-10-CM | POA: Diagnosis not present

## 2016-04-02 DIAGNOSIS — F0280 Dementia in other diseases classified elsewhere without behavioral disturbance: Secondary | ICD-10-CM | POA: Diagnosis not present

## 2016-04-02 DIAGNOSIS — R339 Retention of urine, unspecified: Secondary | ICD-10-CM | POA: Diagnosis not present

## 2016-04-02 DIAGNOSIS — E46 Unspecified protein-calorie malnutrition: Secondary | ICD-10-CM | POA: Diagnosis not present

## 2016-04-02 DIAGNOSIS — G309 Alzheimer's disease, unspecified: Secondary | ICD-10-CM | POA: Diagnosis not present

## 2016-04-02 DIAGNOSIS — N390 Urinary tract infection, site not specified: Secondary | ICD-10-CM | POA: Diagnosis not present

## 2016-04-02 DIAGNOSIS — N200 Calculus of kidney: Secondary | ICD-10-CM | POA: Diagnosis not present

## 2016-04-08 ENCOUNTER — Other Ambulatory Visit: Payer: Medicare Other

## 2016-04-15 ENCOUNTER — Ambulatory Visit: Payer: Medicare Other | Admitting: Internal Medicine

## 2016-04-16 DEATH — deceased

## 2016-05-25 ENCOUNTER — Ambulatory Visit: Payer: Medicare Other | Admitting: Podiatry

## 2016-06-03 ENCOUNTER — Ambulatory Visit: Payer: Medicare Other | Admitting: Podiatry
